# Patient Record
Sex: Male | Born: 1935 | Race: White | Hispanic: No | Marital: Married | Smoking: Former smoker
Health system: Southern US, Community
[De-identification: ages and names within clinical notes are randomized; demographics above are authoritative.]

## PROBLEM LIST (undated history)

## (undated) DIAGNOSIS — N401 Enlarged prostate with lower urinary tract symptoms: Secondary | ICD-10-CM

## (undated) DIAGNOSIS — J849 Interstitial pulmonary disease, unspecified: Secondary | ICD-10-CM

## (undated) DIAGNOSIS — E039 Hypothyroidism, unspecified: Secondary | ICD-10-CM

## (undated) DIAGNOSIS — M1990 Unspecified osteoarthritis, unspecified site: Secondary | ICD-10-CM

## (undated) DIAGNOSIS — N400 Enlarged prostate without lower urinary tract symptoms: Secondary | ICD-10-CM

## (undated) DIAGNOSIS — K219 Gastro-esophageal reflux disease without esophagitis: Secondary | ICD-10-CM

## (undated) DIAGNOSIS — I2510 Atherosclerotic heart disease of native coronary artery without angina pectoris: Secondary | ICD-10-CM

## (undated) DIAGNOSIS — J449 Chronic obstructive pulmonary disease, unspecified: Secondary | ICD-10-CM

## (undated) DIAGNOSIS — N138 Other obstructive and reflux uropathy: Secondary | ICD-10-CM

## (undated) DIAGNOSIS — H269 Unspecified cataract: Secondary | ICD-10-CM

## (undated) DIAGNOSIS — E785 Hyperlipidemia, unspecified: Secondary | ICD-10-CM

## (undated) DIAGNOSIS — S86019A Strain of unspecified Achilles tendon, initial encounter: Secondary | ICD-10-CM

## (undated) HISTORY — DX: Unspecified cataract: H26.9

## (undated) HISTORY — PX: APPENDECTOMY: SHX54

## (undated) HISTORY — PX: CHOLECYSTECTOMY: SHX55

## (undated) HISTORY — PX: COLONOSCOPY: SHX174

## (undated) HISTORY — PX: EYE SURGERY: SHX253

## (undated) HISTORY — DX: Atherosclerotic heart disease of native coronary artery without angina pectoris: I25.10

## (undated) HISTORY — DX: Benign prostatic hyperplasia without lower urinary tract symptoms: N40.0

## (undated) HISTORY — DX: Other obstructive and reflux uropathy: N40.1

## (undated) HISTORY — DX: Other obstructive and reflux uropathy: N13.8

## (undated) HISTORY — DX: Interstitial pulmonary disease, unspecified: J84.9

## (undated) HISTORY — DX: Hypothyroidism, unspecified: E03.9

## (undated) HISTORY — DX: Hyperlipidemia, unspecified: E78.5

## (undated) HISTORY — DX: Strain of unspecified achilles tendon, initial encounter: S86.019A

---

## 2000-09-27 ENCOUNTER — Encounter: Payer: Self-pay | Admitting: Neurology

## 2000-09-27 ENCOUNTER — Encounter: Admission: RE | Admit: 2000-09-27 | Discharge: 2000-09-27 | Payer: Self-pay | Admitting: Neurology

## 2005-08-10 ENCOUNTER — Ambulatory Visit (HOSPITAL_COMMUNITY): Admission: RE | Admit: 2005-08-10 | Discharge: 2005-08-10 | Payer: Self-pay | Admitting: Family Medicine

## 2007-03-29 ENCOUNTER — Ambulatory Visit (HOSPITAL_COMMUNITY): Admission: RE | Admit: 2007-03-29 | Discharge: 2007-03-29 | Payer: Self-pay | Admitting: Family Medicine

## 2007-08-07 ENCOUNTER — Inpatient Hospital Stay (HOSPITAL_COMMUNITY): Admission: EM | Admit: 2007-08-07 | Discharge: 2007-08-08 | Payer: Self-pay | Admitting: Emergency Medicine

## 2007-08-07 ENCOUNTER — Ambulatory Visit: Payer: Self-pay | Admitting: Cardiology

## 2007-08-08 ENCOUNTER — Ambulatory Visit: Payer: Self-pay

## 2007-08-16 ENCOUNTER — Ambulatory Visit: Payer: Self-pay | Admitting: Cardiology

## 2007-11-03 ENCOUNTER — Ambulatory Visit: Payer: Self-pay | Admitting: Gastroenterology

## 2007-11-10 ENCOUNTER — Ambulatory Visit: Payer: Self-pay | Admitting: Gastroenterology

## 2009-07-04 ENCOUNTER — Encounter: Payer: Self-pay | Admitting: Internal Medicine

## 2009-09-09 ENCOUNTER — Encounter: Payer: Self-pay | Admitting: Internal Medicine

## 2009-09-09 ENCOUNTER — Ambulatory Visit (HOSPITAL_COMMUNITY): Admission: RE | Admit: 2009-09-09 | Discharge: 2009-09-09 | Payer: Self-pay | Admitting: Family Medicine

## 2009-12-29 DIAGNOSIS — E039 Hypothyroidism, unspecified: Secondary | ICD-10-CM | POA: Insufficient documentation

## 2009-12-29 DIAGNOSIS — J45909 Unspecified asthma, uncomplicated: Secondary | ICD-10-CM | POA: Insufficient documentation

## 2009-12-29 DIAGNOSIS — E785 Hyperlipidemia, unspecified: Secondary | ICD-10-CM | POA: Insufficient documentation

## 2009-12-29 DIAGNOSIS — I2510 Atherosclerotic heart disease of native coronary artery without angina pectoris: Secondary | ICD-10-CM | POA: Insufficient documentation

## 2009-12-30 ENCOUNTER — Ambulatory Visit: Payer: Self-pay | Admitting: Internal Medicine

## 2009-12-30 DIAGNOSIS — J8410 Pulmonary fibrosis, unspecified: Secondary | ICD-10-CM | POA: Insufficient documentation

## 2009-12-30 DIAGNOSIS — J4489 Other specified chronic obstructive pulmonary disease: Secondary | ICD-10-CM | POA: Insufficient documentation

## 2009-12-30 DIAGNOSIS — J449 Chronic obstructive pulmonary disease, unspecified: Secondary | ICD-10-CM

## 2010-01-19 ENCOUNTER — Telehealth: Payer: Self-pay | Admitting: Internal Medicine

## 2010-02-10 ENCOUNTER — Ambulatory Visit: Payer: Self-pay | Admitting: Internal Medicine

## 2010-02-20 ENCOUNTER — Ambulatory Visit: Payer: Self-pay | Admitting: Internal Medicine

## 2010-02-24 LAB — CONVERTED CEMR LAB
Basophils Absolute: 0 10*3/uL (ref 0.0–0.1)
Eosinophils Relative: 1 % (ref 0.0–5.0)
MCV: 88.4 fL (ref 78.0–100.0)
Monocytes Absolute: 0.7 10*3/uL (ref 0.1–1.0)
Monocytes Relative: 7.4 % (ref 3.0–12.0)
Neutrophils Relative %: 73.1 % (ref 43.0–77.0)
Platelets: 280 10*3/uL (ref 150.0–400.0)
Pro B Natriuretic peptide (BNP): 122.4 pg/mL — ABNORMAL HIGH (ref 0.0–100.0)
RDW: 13.9 % (ref 11.5–14.6)
Sed Rate: 73 mm/hr — ABNORMAL HIGH (ref 0–22)
WBC: 10.1 10*3/uL (ref 4.5–10.5)

## 2010-03-05 ENCOUNTER — Ambulatory Visit: Payer: Self-pay | Admitting: Internal Medicine

## 2010-04-03 ENCOUNTER — Other Ambulatory Visit: Payer: Self-pay | Admitting: Internal Medicine

## 2010-04-03 ENCOUNTER — Encounter: Payer: Self-pay | Admitting: Internal Medicine

## 2010-04-03 ENCOUNTER — Ambulatory Visit
Admission: RE | Admit: 2010-04-03 | Discharge: 2010-04-03 | Payer: Self-pay | Source: Home / Self Care | Attending: Internal Medicine | Admitting: Internal Medicine

## 2010-04-03 LAB — SEDIMENTATION RATE: Sed Rate: 19 mm/hr (ref 0–22)

## 2010-04-03 LAB — BASIC METABOLIC PANEL
BUN: 29 mg/dL — ABNORMAL HIGH (ref 6–23)
CO2: 30 mEq/L (ref 19–32)
Calcium: 9.2 mg/dL (ref 8.4–10.5)
Chloride: 102 mEq/L (ref 96–112)
Creatinine, Ser: 1.1 mg/dL (ref 0.4–1.5)
GFR: 72.43 mL/min (ref >60.00)
Glucose, Bld: 114 mg/dL — ABNORMAL HIGH (ref 70–99)
Potassium: 3.6 mEq/L (ref 3.5–5.1)
Sodium: 140 mEq/L (ref 135–145)

## 2010-04-17 LAB — HEMOCCULT GUIAC POC 1CARD (OFFICE)

## 2010-04-28 NOTE — Assessment & Plan Note (Signed)
Summary: Pulmonary/ ext ov with hfa teaching @ 75% effective   Copy to:  [name] Primary Provider/Referring Provider:  [name]  CC:  Dyspnea- worse.  History of Present Illness: 81 yowm quit smoking in 1982 with chronic cough since then  referred by Dr Christell Constant for doe starting in the summer of 2011 and PF on cxr/ct.   December 30, 2009  1st pulmonary office eval  cc doe x rapid walk or uphills but does ok flat indolent onset but not progressive since around June 2011,  cough is present x years, worse with colds, no better with symbicort,  better breathing and coughing after symbicort and prednisone together.  hfa poor, rec work on technique and gerd diet/ ppi two times a day.  February 10, 2010 ov sob no better but has learned to pace himpself.  following diet and using ppi/ symbiocort but still struggling with hfa.  rec work on hfa, come to [facility] office for pft   February 20, 2010 ov cc worsening doe, still struggling with hfa. minimal cough. Pt denies any significant sore throat, dysphagia, itching, sneezing,  nasal congestion or excess secretions,  fever, chills, sweats, unintended wt loss, pleuritic or exertional cp, hempoptysis,   notes marked variability iin activity tolerance  orthopnea pnd or leg swelling. Pt also denies any obvious fluctuation in symptoms with weather or environmental change or other alleviating or aggravating factors.       Current Medications (verified): 1)  Levothroid 25 Mcg Tabs (Levothyroxine Sodium) .... One Tablet M, W, F 2)  Levothroid 125 Mcg Tabs (Levothyroxine Sodium) .... Take 1 Tablet By Mouth Once A Day Except On Sat and Sundy Take 1/2 Tablet 3)  Omeprazole 20 Mg Cpdr (Omeprazole) .... Take One 30-60 Min Before First and Last Meals of The Day 4)  Simvastatin 80 Mg Tabs (Simvastatin) .... Take 1 Tablet By Mouth Once A Day 5)  Terazosin Hcl 10 Mg Caps (Terazosin Hcl) .... Take 1 Capsule By Mouth Once A Day 6)  Ecotrin 325 Mg Tbec (Aspirin) .Marland Kitchen.. 1 Once  Daily 7)  Vitamin D 2000 Unit Caps (Cholecalciferol) .... Take 1 Tablet By Mouth Once A Day 8)  Symbicort 160-4.5 Mcg/act Aero (Budesonide-Formoterol Fumarate) .... 2 Puffs Twice Daily 9)  Mucinex Dm 30-600 Mg Xr12h-Tab (Dextromethorphan-Guaifenesin) .... As Needed  Allergies (verified): No Known Drug Allergies  Past History:  Past Medical History: Hypothyroidism Hyperlipidemia BPH Asthma    - PFT's wnl  07/2005    - HFA  50% p coaching December 30, 2009 > 50% February 10, 2010 > 75% February 20, 2010  Coronary Heart Disease Interstitial lung dz      - onset ? 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 Nl external ear canals without cough reflex NECK :  without JVD/Nodes/TM/ nl carotid upstrokes bilaterally LUNGS: no acc muscle use,  trace insp crackles bilaterally CV:  RRR  no s3 or murmur or increase in P2, no edema  ABD:  soft and nontender with nl excursion in the supine position. No bruits or organomegaly, bowel sounds nl MS:  warm without deformities, calf tenderness, cyanosis  ? very mild  clubbing    White Cell Count          10.1 K/uL                    4.5-10.5   Red Cell Count            4.56 Mil/uL                 4.22-5.81   Hemoglobin                13.7 g/dL                   14.7-82.9   Hematocrit                40.3 %                      39.0-52.0   MCV                       88.4 fl                     78.0-100.0   MCHC                      34.0 g/dL                   56.2-13.0   RDW                       13.9 %                      11.5-14.6   Platelet Count            280.0 K/uL                  150.0-400.0   Neutrophil %              73.1 %                      43.0-77.0   Lymphocyte %              18.2 %                      12.0-46.0   Monocyte %                7.4 %  3.0-12.0   Eosinophils%              1.0 %                       0.0-5.0   Basophils %               0.3 %                       0.0-3.0   Neutrophill Absolute      7.4 K/uL                    1.4-7.7   Lymphocyte Absolute       1.8 K/uL                    0.7-4.0   Monocyte Absolute         0.7 K/uL                    0.1-1.0  Eosinophils, Absolute                             0.1 K/uL                    0.0-0.7   Basophils Absolute        0.0 K/uL                    0.0-0.1  Tests: (2) B-Type Natiuretic Peptide (BNPR)  B-Type Natriuetic Peptide                        [H]  122.4 pg/mL                 0.0-100.0  Tests: (3) Sed Rate (ESR)   Sed Rate             [H]  73 mm/hr                    0-22  Impression & Recommendations:  Problem # 1:  PULMONARY FIBROSIS ILD POST INFLAMMATORY CHRONIC (ICD-515) DDx for pulmonary fibrosis with honeycombing includes idiopathic pulmonary fibrosis, pulmonary fibrosis associated with rheumatologic disease, adverse effect from  drugs such as chemotherapy or amiodarone exposure, nonspecific interstitial pneumonia which is typically steroid responsive, and chronic hypersensitivity pneumonitis/ pneumonconiosis.      Atypical pattern suggestive of asbestosis but no exp hx - desats walking so needs to  compltete the w/u with full pft's and f/u in [place] office  also needs GERD Rx based on assoc with PF though not necessarily cause and effect.   Elevated esr c/w collagen vasc dz though none obvious here,  rec short course of prednisone only.  Problem # 2:  ASTHMA (ICD-493.90) No evidence of  copd  and variability in ex tol  c/w asthma rathen  than PF so try max hfa effort then regroup in 2 weeks  See instructions for specific recommendations   I spent extra time with the patient today explaining optimal mdi  technique.  This improved from  50-75% p coaching  Medications Added to Medication List This Visit: 1)  Ecotrin 325 Mg Tbec (Aspirin) .Marland Kitchen.. 1 once daily 2)  Tramadol Hcl 50 Mg Tabs (Tramadol hcl) .... One to two by mouth every 4-6 hours 3)  Prednisone 10 Mg Tabs (Prednisone) .Marland KitchenMarland KitchenMarland Kitchen  4 each am x 2days, 2x2days, 1x2days and stop  Other Orders: T-Allergy Profile Region II-[place], [place], [place], [place], [place] (5484) TLB-CBC Platelet - w/Differential (85025-CBCD) TLB-BNP (B-Natriuretic Peptide) (83880-BNPR) TLB-Sedimentation Rate (ESR) (85652-ESR) Prescription Created Electronically [phone]) Est. Patient Level IV (62703)  Patient Instructions: 1)  Prednisone 10 mg 4 each am x 2days, 2x2days, 1x2days and stop  2)  Work on Cabin crew inhaler technique:  relax and blow all the way out then take a nice smooth deep breath back in, triggering the inhaler at same time you start breathing in  3)  Take delsym two tsp every 12 hours and add tramadol 50 mg up to every 4 hours to suppress the urge to cough. Swallowing water or using ice chips/non mint and menthol containing candies (such as lifesavers or sugarless jolly ranchers) are also effective.  4)  Please schedule a follow-up appointment in 2 weeks, sooner if needed with cxr on return 5)  ADD Needs cxr on return Prescriptions: TRAMADOL HCL 50 MG  TABS (TRAMADOL HCL) One to two by mouth every 4-6 hours  #40 x 0   Entered and Authorized by:   Nyoka Cowden MD    Signed by:   Nyoka Cowden MD on 02/20/2010   Method used:   Electronically to        [facility]  [address] (retail)       [address]       [place], [place]  [address]       Ph: [phone]       Fax: [phone]   RxID:   [id number] PREDNISONE 10 MG  TABS (PREDNISONE) 4 each am x 2days, 2x2days, 1x2days and stop  #14 x 0   Entered and Authorized by:   Nyoka Cowden MD   Signed by:   Nyoka Cowden MD on 02/20/2010   Method used:   Electronically to        [facility] [phone]* (retail)       [address] [place], [place]  [address]       Ph: [phone] or [phone]       Fax: [phone]   RxID:   [id number]   Appended Document: Pulmonary/ ext ov with hfa teaching @ 75% effective see addendum to instructions

## 2010-04-28 NOTE — Assessment & Plan Note (Signed)
Summary: Pulmonary/ ext ov  sats =88 p 2 laps>  start prednisone esr 73   Copy to:  [name] Primary Provider/Referring Provider:  [name]  CC:  Dyspnea- no change.  History of Present Illness: 80 yowm quit smoking in 1982 with improvement but never complete resolution in cough since then  referred by Dr Christell Constant for doe starting in the summer of 2011 and PF on cxr/ct.   December 30, 2009  1st pulmonary office eval  cc doe x rapid walk or uphills but does ok flat indolent onset but not progressive since around June 2011,  cough is present x years, worse with colds, no better with symbicort,  better breathing and coughing after symbicort and prednisone together.  hfa poor, rec work on technique and gerd diet/ ppi two times a day.  February 10, 2010 ov sob no better but has learned to pace himself.  following diet and using ppi/ symbiocort but still struggling with hfa.  rec work on hfa, come to [facility] office for pft   February 20, 2010 ov cc worsening doe, still struggling with hfa. minimal cough.  ESR 73 rec short course of prednisone helped, never took tramadol   March 05, 2010 ov some better esp cough with delsym and prednisone, did not take tramadol as rec but didn't really need it.  Pt denies any significant sore throat, dysphagia, itching, sneezing,  nasal congestion or excess secretions,  fever, chills, sweats, unintended wt loss, pleuritic or exertional cp, hempoptysis,  variability  in activity tolerance  orthopnea pnd or leg swelling. Pt also denies any obvious fluctuation in symptoms with weather or environmental change or other alleviating or aggravating factors.       Current Medications (verified): 1)  Levothroid 25 Mcg Tabs (Levothyroxine Sodium) .... One Tablet M, W, F 2)  Levothroid 125 Mcg Tabs (Levothyroxine Sodium) .... Take 1 Tablet By Mouth Once A Day Except On Sat and Sundy Take 1/2 Tablet 3)  Omeprazole 20 Mg Cpdr (Omeprazole) .... Take One 30-60 Min Before First and  Last Meals of The Day 4)  Simvastatin 80 Mg Tabs (Simvastatin) .... Take 1 Tablet By Mouth Once A Day 5)  Terazosin Hcl 10 Mg Caps (Terazosin Hcl) .... Take 1 Capsule By Mouth Once A Day 6)  Ecotrin 325 Mg Tbec (Aspirin) .Marland Kitchen.. 1 Once Daily 7)  Vitamin D 2000 Unit Caps (Cholecalciferol) .... Take 1 Tablet By Mouth Once A Day 8)  Symbicort 160-4.5 Mcg/act Aero (Budesonide-Formoterol Fumarate) .... 2 Puffs Twice Daily  Allergies (verified): No Known Drug Allergies  Past History:  Past Medical History: Hypothyroidism Hyperlipidemia BPH ?Asthma    - PFT's wnl  07/2005 > no better on symbicort so d/c March 06, 2010     - HFA  50% p coaching December 30, 2009 > 50% February 10, 2010 > 75% February 20, 2010     - Allergy profile neg 02/20/10  Coronary Heart Disease Interstitial lung dz      - onset ? 2007 with CT Chest 08/10/05 c/w PF esp RUL  > LUL      - CT Chest suggestive of asbestosis 09/09/09 non contrasted      - Desat p 3 laps at [facility] office December 30, 2009       - PFTs February 20, 2010 FEV1  1.66 (67%) ratio 80, no better p B2,  DLC0 55 > corrects to 123      - ESR 73 February 20, 2010 > Pred x 6 days only> better so start daily prednisone March 06, 2010   Social History: Reviewed history from 02/20/2010 and no changes required. 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  Vital Signs:  Patient profile:   77 year old male Weight:      179 pounds O2 Sat:      95 % on Room air Temp:     97.5 degrees F oral Pulse rate:   77 / minute BP sitting:   112 / 64  (left arm)  Vitals Entered By: Vernie Murders (March 05, 2010 2:08 PM)  O2 Flow:  Room air  Serial Vital Signs/Assessments:  Comments: 3:18 PM Ambulatory Pulse Oximetry  Resting; HR__81___    02 Sat__94%ra___  Lap1 (185 feet)   HR__86___   02 Sat__91%ra___ Lap2 (185 feet)   HR__90___   02 Sat__88%ra___    Lap3 (185 feet)   HR_____   02 Sat_____  ___Test Completed  without Difficulty _x__Test Stopped due ZO:[id number] o2 sats and pt started to c/p feeling SOB   By: Vernie Murders    Physical Exam  Additional Exam:  wt 177 February 10, 2010  > 179 February 20, 2010 . 179 March 05, 2010  pleasant amb wm nad, mild hoarseness HEENT: nl dentition, turbinates, and orophanx. Nl external ear canals without cough reflex NECK :  without JVD/Nodes/TM/ nl carotid upstrokes bilaterally LUNGS: no acc muscle use,  trace insp crackles bilaterally CV:  RRR  no s3 or murmur or increase in P2, no edema  ABD:  soft and nontender with nl excursion in the supine position. No bruits or organomegaly, bowel sounds nl MS:  warm without deformities, calf tenderness, cyanosis  ? very mild  clubbing    Impression & Recommendations:  Problem # 1:  PULMONARY FIBROSIS ILD POST INFLAMMATORY CHRONIC (ICD-515)  DDx for pulmonary fibrosis with honeycombing includes idiopathic pulmonary fibrosis, pulmonary fibrosis associated with rheumatologic disease, adverse effect from  drugs such as chemotherapy or amiodarone exposure, nonspecific interstitial pneumonia which is typically steroid responsive, and chronic hypersensitivity pneumonitis/ pneumonconiosis.    Atypical pattern suggestive of asbestosis but no exp hx - desats walking so needs to compltete the w/u with full pft's and f/u in [place] office  also needs GERD Rx based on assoc with PF though not necessarily cause and effect.   Elevated esr c/w collagen vasc dz though none obvious here,  he's convinced he's better on prednisone in terms of sob and arthritis.  The goal with a chronic steroid dependent illness is always arriving at the lowest effective dose that controls the disease/symptoms and not accepting a set "formula" which is based on statistics that don't take into accound individual variability or the natural hx of the dz in every individual patient, which may well vary over time.   For now try 20 mg per day ceiling and  10 mg per day floor.  Discussed in detail all the  indications, usual  risks and alternatives  relative to the benefits with patient who agrees to proceed with maint prednisone for now.  Medications Added to Medication List This Visit: 1)  Prednisone 10 Mg Tabs (Prednisone) .... Take 2 daily with breakfast until you improve then one daily  Other Orders: T-2 View CXR (71020TC) Est. Patient Level IV (99214) Pulse Oximetry, Ambulatory (98119)  Patient Instructions: 1)  Delsym if needed for cough 2)  start prednisone 10 mg  2 daily with bfast unitl you improve then 1 daily 3)  Please schedule a follow-up appointment in 4 weeks, sooner if needed  Prescriptions: PREDNISONE 10 MG  TABS (PREDNISONE) take 2 daily with breakfast until you improve then one daily  #100 x 0   Entered and Authorized by:   Nyoka Cowden MD   Signed by:   Nyoka Cowden MD on 03/05/2010   Method used:   Electronically to        [facility] [address] [facility] (retail)       [address]       [place], [place]  [address]       Ph: [phone]       Fax: [phone]   RxID:   [id number]

## 2010-04-28 NOTE — Assessment & Plan Note (Signed)
Summary: Pulmonary new pt eval for ?COPD/pf   Copy to:  [name] Primary Provider/Referring Provider:  [name]  CC:  Pulmonary Consult for SOb on exertion. Marland Kitchen  History of Present Illness: 46 yowm quit smoking in 1982 with chronic cough since then but no sob  referred by Dr Christell Constant for doe starting in the summer of 2011   December 30, 2009  1st pulmonary office eval  cc doe x rapid walk or uphills but does ok flat indolent onset but not progressive since around June 2011,  cough is present x years, worse with colds, no better with symbicort,  better breathing and coughing after symbicort and prednisone.  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  Pt denies any significant sore throat, dysphagia, itching, sneezing,  nasal congestion or excess secretions,  fever, chills, sweats, unintended wt loss, pleuritic or exertional cp, hempoptysis, change in activity tolerance  orthopnea pnd or leg swelling Pt also denies any obvious fluctuation in symptoms with weather or environmental change or other alleviating or aggravating factors.       Current Medications (verified): 1)  Levothroid 25 Mcg Tabs (Levothyroxine Sodium) .... One Tablet M, W, F 2)  Levothroid 125 Mcg Tabs (Levothyroxine Sodium) .... Take 1 Tablet By Mouth Once A Day Except On Sat and Sundy Take 1/2 Tablet 3)  Omeprazole 20 Mg Cpdr (Omeprazole) .... Take 1 Tablet By Mouth Once A Day 4)  Simvastatin 80 Mg Tabs (Simvastatin) .... Take 1 Tablet By Mouth Once A Day 5)  Terazosin Hcl 10 Mg Caps (Terazosin Hcl) .... Take 1 Capsule By Mouth Once A Day 6)  Bufferin 325 Mg Tabs (Aspirin Buf(Cacarb-Mgcarb-Mgo)) .... Take 1 Tablet By Mouth Once A Day 7)  Vitamin D 2000 Unit Caps (Cholecalciferol) .... Take 1 Tablet By Mouth Once A Day 8)  Symbicort 160-4.5 Mcg/act Aero (Budesonide-Formoterol Fumarate) .... 2 Puffs Twice Daily 9)  Mucinex Dm 30-600 Mg Xr12h-Tab (Dextromethorphan-Guaifenesin) .... As  Needed  Allergies (verified): No Known Drug Allergies  Past History:  Past Medical History: Hypothyroidism Hyperlipidemia BPH Asthma    - PFT's wnl  07/2005    - HFA  50% p coaching December 30, 2009 Coronary Heart Disease Interstitial lung dz      - onset ? 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  The patient denies shortness of breath at rest, non-productive cough, coughing up blood, chest pain, irregular heartbeats, acid heartburn, indigestion, loss of appetite, weight change, abdominal pain, difficulty swallowing, sore throat, tooth/dental problems, headaches, nasal congestion/difficulty breathing through nose, sneezing, itching, ear ache, anxiety, depression, hand/feet swelling, joint stiffness or pain, rash, change in color of mucus, and fever.    Vital Signs:  Patient profile:   77 year old male Height:      68 inches Weight:      180 pounds BMI:     27.47 O2 Sat:      96 % on Room air Temp:     97.0 degrees F oral Pulse rate:  63 / minute BP sitting:   120 / 70  (right arm) Cuff size:   regular  Vitals Entered By: Carron Curie CMA (December 30, 2009 9:06 AM)  O2 Flow:  Room air  Serial Vital Signs/Assessments:  Comments: Ambulatory Pulse Oximetry  Resting; HR__78___    02 Sat___94__  Lap1 (185 feet)   HR_84____   02 Sat__94___ Lap2 (185 feet)   HR___92__   02 Sat__93___    Lap3 (185 feet)   HR__92___   02 Sat__88__ 3 laps in [place] on room air  ___Test Completed without Difficulty __x_Test Stopped due to: pt desaturated    By: Carron Curie CMA    Physical Exam  Additional Exam:  wt 180 pleasant  amb wm nad, mild hoarseness HEENT: nl dentition, turbinates, and orophanx. Nl external ear canals without cough reflex NECK :  without JVD/Nodes/TM/ nl carotid upstrokes bilaterally LUNGS: no acc muscle use,  trace insp crackles bilaterally CV:  RRR  no s3 or murmur or increase in P2, no edema  ABD:  soft and nontender with nl excursion in the supine position. No bruits or organomegaly, bowel sounds nl MS:  warm without deformities, calf tenderness, cyanosis or clubbing SKIN: warm and dry without lesions   NEURO:  alert, approp, no deficits     Impression & Recommendations:  Problem # 1:  COPD UNSPECIFIED (ICD-496) Suggested by improvement on symbicort    DDX of  difficult airways managment all start with A and  include Adherence, Ace Inhibitors, Acid Reflux, Active Sinus Disease, Alpha 1 Antitripsin deficiency, Anxiety masquerading as Airways dz,  ABPA,  allergy(esp in young), Aspiration (esp in elderly), Adverse effects of DPI,  Active smokers, plus one B  = Beta blocker use..   Adherence:  I spent extra time with the patient today explaining optimal mdi  technique.  This improved from  25-50%  ?Acid reflux: See instructions for specific recommendations    .  Problem # 2:  PULMONARY FIBROSIS ILD POST INFLAMMATORY CHRONIC (ICD-515) Atypical pattern suggestive of asbestosis but no exp hx - desats walking so needs to compltete the w/u with full pft's and f/u in [place] office  also needs GERD Rx based on assoc with PF though not necessarily cause and effect  Medications Added to Medication List This Visit: 1)  Omeprazole 20 Mg Cpdr (Omeprazole) .... Take one 30-60 min before first and last meals of the day 2)  Bufferin 325 Mg Tabs (Aspirin buf(cacarb-mgcarb-mgo)) .... Take 1 tablet by mouth once a day 3)  Symbicort 160-4.5 Mcg/act Aero (Budesonide-formoterol fumarate) .... 2 puffs twice daily 4)  Mucinex Dm 30-600 Mg Xr12h-tab (Dextromethorphan-guaifenesin) .... As needed  Complete  Medication List: 1)  Levothroid 25 Mcg Tabs (Levothyroxine sodium) .... One tablet m, w, f 2)  Levothroid 125 Mcg Tabs (Levothyroxine sodium) .... Take 1 tablet by mouth once a day except on sat and sundy take 1/2 tablet 3)  Omeprazole 20 Mg Cpdr (Omeprazole) .... Take one 30-60 min before first and last meals of the day 4)  Simvastatin 80 Mg Tabs (Simvastatin) .... Take 1 tablet by mouth once a day 5)  Terazosin Hcl 10 Mg Caps (Terazosin hcl) .... Take 1 capsule by mouth once a day 6)  Bufferin 325 Mg Tabs (Aspirin buf(cacarb-mgcarb-mgo)) .... Take 1 tablet by mouth once a day 7)  Vitamin D 2000 Unit Caps (Cholecalciferol) .... Take 1 tablet by mouth once a day 8)  Symbicort 160-4.5 Mcg/act Aero (Budesonide-formoterol fumarate) .... 2 puffs  twice daily 9)  Mucinex Dm 30-600 Mg Xr12h-tab (Dextromethorphan-guaifenesin) .... As needed  Other Orders: New Patient Level V (96045) Pulse Oximetry, Ambulatory (40981) Prescription Created Electronically [id number])  Patient Instructions: 1)  GERD (REFLUX)  is a common cause of respiratory symptoms. It commonly presents without heartburn and can be treated with medication, but also with lifestyle changes including avoidance of late meals, excessive alcohol, smoking cessation, and avoid fatty foods, chocolate, peppermint, colas, red wine, and acidic juices such as orange juice. NO MINT OR MENTHOL PRODUCTS SO NO COUGH DROPS  2)  USE SUGARLESS CANDY INSTEAD (jolley ranchers)  3)  NO OIL BASED VITAMINS  4)  Increase omeprazole to 20mg  Take one 30-60 min before first and last meals of the day  5)  Work on inhaler technique:  relax and blow all the way out then take a nice smooth deep breath back in, triggering the inhaler at same time you start breathing in and hold for a few seconds then back out thru the nose 6)  Please schedule a follow-up appointment in 6 weeks, sooner if needed  Prescriptions: OMEPRAZOLE 20 MG CPDR (OMEPRAZOLE) Take one 30-60 min before  first and last meals of the day  #60 x 11   Entered and Authorized by:   Nyoka Cowden MD   Signed by:   Nyoka Cowden MD on 12/30/2009   Method used:   Electronically to        [facility] [phone]* (retail)       [address] [place], [place]  [address]       Ph: [phone] or [phone]       Fax: [phone]   RxID:   [id number]    Immunization History:  Influenza Immunization History:    Influenza:  fluvax 3+ (12/09/2009)  Pneumovax Immunization History:    Pneumovax:  pneumovax (12/29/1998)

## 2010-04-28 NOTE — Assessment & Plan Note (Signed)
Summary: Pulmonary/ hfa 50%/ f/u copd/ pf > pft's next    Copy to:  [name] Primary Provider/Referring Provider:  [name]  CC:  6 week follow-up.Marland Kitchen  History of Present Illness: 72 yowm quit smoking in 1982 with chronic cough since then  referred by Dr Christell Constant for doe starting in the summer of 2011 and PF on cxr/ct.   December 30, 2009  1st pulmonary office eval  cc doe x rapid walk or uphills but does ok flat indolent onset but not progressive since around June 2011,  cough is present x years, worse with colds, no better with symbicort,  better breathing and coughing after symbicort and prednisone together.  hfa poor, rec work on technique and gerd diet/ ppi two times a day.  February 10, 2010 ov sob no better but has learned to pace himpself.  following diet and using ppi/ symbiocort but still struggling with hfa.  Pt denies any significant sore throat, dysphagia, itching, sneezing,  nasal congestion or excess secretions,  fever, chills, sweats, unintended wt loss, pleuritic or exertional cp, hempoptysis, variability  in activity tolerance  orthopnea pnd or leg swelling. Pt also denies any obvious fluctuation in symptoms with weather or environmental change or other alleviating or aggravating factors.       Current Medications (verified): 1)  Levothroid 25 Mcg Tabs (Levothyroxine Sodium) .... One Tablet M, W, F 2)  Levothroid 125 Mcg Tabs (Levothyroxine Sodium) .... Take 1 Tablet By Mouth Once A Day Except On Sat and Sundy Take 1/2 Tablet 3)  Omeprazole 20 Mg Cpdr (Omeprazole) .... Take One 30-60 Min Before First and Last Meals of The Day 4)  Simvastatin 80 Mg Tabs (Simvastatin) .... Take 1 Tablet By Mouth Once A Day 5)  Terazosin Hcl 10 Mg Caps (Terazosin Hcl) .... Take 1 Capsule By Mouth Once A Day 6)  Bufferin 325 Mg Tabs (Aspirin Buf(Cacarb-Mgcarb-Mgo)) .... Take 1 Tablet By Mouth Once A Day 7)  Vitamin D 2000 Unit Caps (Cholecalciferol) .... Take 1 Tablet By Mouth Once A Day 8)   Symbicort 160-4.5 Mcg/act Aero (Budesonide-Formoterol Fumarate) .... 2 Puffs Twice Daily 9)  Mucinex Dm 30-600 Mg Xr12h-Tab (Dextromethorphan-Guaifenesin) .... As Needed  Allergies (verified): No Known Drug Allergies  Past History:  Past Medical History: Hypothyroidism Hyperlipidemia BPH Asthma    - PFT's wnl  07/2005    - HFA  50% p coaching December 30, 2009 > 50% February 10, 2010  Coronary Heart Disease Interstitial lung dz      - onset ? 2007 with CT Chest 08/10/05 c/w PF esp RUL  > LUL      - CT Chest suggestive of asbestosis 09/09/09      - Desat p 3 laps at [facility] office December 30, 2009   Vital Signs:  Patient profile:   77 year old male Height:      68 inches Weight:      177 pounds O2 Sat:      95 % on Room air Temp:     97.0 degrees F oral Pulse rate:   64 / minute BP sitting:   118 / 70  (right arm)  Vitals Entered By: Carron Curie CMA (February 10, 2010 9:14 AM)  O2 Flow:  Room air  Physical Exam  Additional Exam:  wt 180 > 177 February 10, 2010  pleasant amb wm nad, mild hoarseness HEENT: nl dentition, turbinates, and orophanx. Nl external ear canals without cough reflex NECK :  without JVD/Nodes/TM/ nl carotid upstrokes bilaterally LUNGS: no acc muscle use,  trace insp crackles bilaterally CV:  RRR  no s3 or murmur or increase in P2, no edema  ABD:  soft and nontender with nl excursion in the supine position. No bruits or organomegaly, bowel sounds nl MS:  warm without deformities, calf tenderness, cyanosis  ? very mild  clubbing    Impression & Recommendations:  Problem # 1:  COPD UNSPECIFIED (ICD-496) Suggested by improvement on symbicort and prednisone may have AB component.    DDX of  difficult airways managment all start with A and  include Adherence, Ace Inhibitors, Acid Reflux, Active Sinus Disease, Alpha 1 Antitripsin deficiency, Anxiety masquerading as Airways dz,  ABPA,  allergy(esp in young), Aspiration (esp in elderly), Adverse  effects of DPI,  Active smokers, plus one B  = Beta blocker use..   Adherence:  I spent extra time with the patient today explaining optimal mdi  technique.  This improved from  25-50%  ?Acid reflux: See instructions for specific recommendations    .  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  Other Orders: Est. Patient Level III (16109) HFA Instruction [id number])  Patient Instructions: 1)  PFT's and office visit same day to see me in [place] 2)  Work on inhaler technique:  relax and blow all the way out then take a nice smooth deep breath back in, triggering the inhaler at same time you start breathing in

## 2010-04-28 NOTE — Progress Notes (Signed)
Summary: [facility] appt  Phone Note Outgoing Call   Call placed by: Carron Curie CMA,  January 19, 2010 5:31 PM Call placed to: Patient Summary of Call: [facility] patient. Pt needs appt for 6 week follow-up. I have LMTCBx1 to schedule pt. November date is 02-10-10. Initial call taken by: Carron Curie CMA,  January 19, 2010 5:32 PM  Follow-up for Phone Call        LMTCBx2 to set appt for [facility] on 02-10-10. Carron Curie CMA  January 22, 2010 9:37 AM  pt set to see MW on 02-10-10 at 9:15.Carron Curie CMA  February 04, 2010 12:02 PM

## 2010-04-28 NOTE — Miscellaneous (Signed)
Summary: Orders Update-pft charges//jwr  Clinical Lists Changes  Orders: Added new Service order of Carbon Monoxide diffusing w/capacity (94720) - Signed Added new Service order of Lung Volumes (94240) - Signed Added new Service order of Spirometry (Pre & Post) (94060) - Signed 

## 2010-04-30 NOTE — Assessment & Plan Note (Signed)
Summary: Pulmonary/ext f/u ov better on prednisone    Copy to:  [name] Primary Provider/Referring Provider:  [name]  CC:  Dyspnea- improved.  History of Present Illness: 82 yowm quit smoking in 1982 with improvement but never complete resolution in cough since then  referred by Dr Christell Constant for doe starting in the summer of 2011 and PF on cxr/ct.   December 30, 2009  1st pulmonary office eval  cc doe x rapid walk or uphills but does ok flat indolent onset but not progressive since around June 2011,  cough is present x years, worse with colds, no better with symbicort,  better breathing and coughing after symbicort and prednisone together.  hfa poor, rec work on technique and gerd diet/ ppi two times a day.  February 10, 2010 ov sob no better but has learned to pace himself.  following diet and using ppi/ symbiocort but still struggling with hfa.  rec work on hfa, come to [employer] office for pft   February 20, 2010 ov cc worsening doe, still struggling with hfa. minimal cough.  ESR 73 rec short course of prednisone helped, never took tramadol   March 05, 2010 ov some better esp cough with delsym and prednisone, did not take tramadol as rec but didn't really need it.  Delsym if needed for cough start prednisone 10 mg 2 daily with bfast unitl you improve then 1 daily Symbicort 160 stop  April 03, 2010  ov cough and breathing both better to his satisfaction on the 20 p reduced it to 10 mg per day and no change no following diet, chewing mint gum and using oil based vit d still.  Pt denies any significant sore throat, dysphagia, itching, sneezing,  nasal congestion or excess secretions,  fever, chills, sweats, unintended wt loss, pleuritic or exertional cp, hempoptysis, variability in activity tolerance which is now improved vs baseline, no orthopnea pnd or leg swelling       Current Medications (verified): 1)  Levothroid 25 Mcg Tabs (Levothyroxine Sodium) .... One Tablet M, W, F 2)   Levothroid 125 Mcg Tabs (Levothyroxine Sodium) .... Take 1 Tablet By Mouth Once A Day Except On Sat and Sundy Take 1/2 Tablet 3)  Omeprazole 20 Mg Cpdr (Omeprazole) .... Take One 30-60 Min Before First and Last Meals of The Day 4)  Simvastatin 80 Mg Tabs (Simvastatin) .... Take 1 Tablet By Mouth Once A Day 5)  Terazosin Hcl 10 Mg Caps (Terazosin Hcl) .... Take 1 Capsule By Mouth Once A Day 6)  Ecotrin 325 Mg Tbec (Aspirin) .Marland Kitchen.. 1 Once Daily 7)  Vitamin D 2000 Unit Caps (Cholecalciferol) .... Take 1 Tablet By Mouth Once A Day 8)  Prednisone 10 Mg  Tabs (Prednisone) .Marland Kitchen.. 1 Once Daily  Allergies (verified): No Known Drug Allergies  Past History:  Past Medical History: Hypothyroidism Hyperlipidemia BPH ?Asthma    - PFT's wnl  07/2005 > no better on symbicort so d/c March 06, 2010     - HFA  50% p coaching December 30, 2009 > 50% February 10, 2010 > 75% February 20, 2010     - Allergy profile neg 02/20/10  Coronary Heart Disease Interstitial lung dz      - onset ? 2007 with CT Chest 08/10/05 c/w PF esp RUL  > LUL      - CT Chest suggestive of asbestosis 09/09/09 non contrasted      - Desat p 3 laps at [facility] office  December 30, 2009       - PFTs February 20, 2010 FEV1  1.66 (67%) ratio 80, no better p B2,  DLC0 55 > corrects to 123      - ESR 73 February 20, 2010 > Pred x 6 days only> better so start daily prednisone March 06, 2010       - ESR 14 on 10 mg per day April 03, 2010 but still desats walking  Vital Signs:  Patient profile:   77 year old male Weight:      177 pounds O2 Sat:      94 % on Room air Temp:     97.5 degrees F oral Pulse rate:   89 / minute BP sitting:   112 / 80  (left arm)  Vitals Entered By: Vernie Murders (April 03, 2010 9:53 AM)  O2 Flow:  Room air  Serial Vital Signs/Assessments:  Comments: 10:25 AM Ambulatory Pulse Oximetry  Resting; HR__83___    02 Sat__95%ra___  Lap1 (185 feet)   HR__93___   02 Sat__94%ra___ Lap2 (185 feet)    HR__101___   02 Sat__86%ra___    Lap3 (185 feet)   HR_____   02 Sat_____  ___Test Completed without Difficulty _x__Test Stopped due to: o2 sats decreased to 86%ra   By: Vernie Murders    Physical Exam  Additional Exam:  wt 177 February 10, 2010  > 179 February 20, 2010 . 179 March 05, 2010 > 177 April 03, 2010  pleasant amb wm nad, mild hoarseness HEENT: nl dentition, turbinates, and orophanx. Nl external ear canals without cough reflex NECK :  without JVD/Nodes/TM/ nl carotid upstrokes bilaterally LUNGS: no acc muscle use,  trace insp crackles bilaterally CV:  RRR  no s3 or murmur or increase in P2, no edema  ABD:  soft and nontender with nl excursion in the supine position. No bruits or organomegaly, bowel sounds nl MS:  warm without deformities, calf tenderness, cyanosis  ? 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    Atypical pattern suggestive of asbestosis but no exp hx - desats walking still but not observing diet max and   needs GERD Rx based on assoc with PF though not necessarily cause and effect as per most recent published studies in PF   Elevated esr c/w collagen vasc dz though none obvious here,  he's convinced he's better on prednisone in terms of sob and arthritis.  The goal with a chronic steroid dependent illness is always arriving at the lowest effective dose that controls the disease/symptoms and not accepting a set "formula" which is based on statistics that don't take into accound individual variability or the natural hx of the dz  in every individual patient, which may well vary over time.   For now try 20 mg per day ceiling and 5  mg per day floor.  Medications Added to Medication List This Visit: 1)  Prednisone 10 Mg Tabs (Prednisone) .Marland Kitchen.. 1 once daily  Other Orders: T-Vitamin D (25-Hydroxy) (16109-60454) TLB-Sedimentation Rate (ESR) (85652-ESR) TLB-BMP (Basic Metabolic Panel-BMET) (80048-METABOL) Est. Patient Level IV (09811) Prescription Created Electronically [phone]) Pulse Oximetry, Ambulatory (29562)  Patient Instructions: 1)  GERD (REFLUX)  is a common cause of respiratory symptoms. It commonly presents without heartburn and can be treated with medication, but also with lifestyle changes including avoidance of late meals, excessive alcohol, smoking cessation, and avoid fatty foods, chocolate, peppermint, colas, red wine, and acidic juices such as orange juice. NO MINT OR MENTHOL PRODUCTS SO NO COUGH DROPS  2)  USE SUGARLESS CANDY INSTEAD (jolley ranchers)  3)  NO OIL BASED VITAMINS (Vit D needs to be powdered base) 4)  Try a ceiling dose of Prednisone is 10 mg 2 daily and a floor of 5 mg  one half pill daily taken at breakfast 5)  Return to office in 3 months, sooner if needed  Prescriptions: PREDNISONE 10 MG  TABS (PREDNISONE) 1 once daily  #100 x 0   Entered and Authorized by:   Nyoka Cowden MD   Signed by:   Nyoka Cowden MD on 04/03/2010   Method used:   Electronically to        [facility] [phone]* (retail)       [address] [place], [place]  [address]       Ph: [phone] or [phone]       Fax: [phone]   RxID:   [id number]

## 2010-06-22 ENCOUNTER — Encounter: Payer: Self-pay | Admitting: Internal Medicine

## 2010-06-26 ENCOUNTER — Ambulatory Visit (INDEPENDENT_AMBULATORY_CARE_PROVIDER_SITE_OTHER): Payer: Medicare Other | Admitting: Internal Medicine

## 2010-06-26 ENCOUNTER — Other Ambulatory Visit (INDEPENDENT_AMBULATORY_CARE_PROVIDER_SITE_OTHER): Payer: Medicare Other

## 2010-06-26 ENCOUNTER — Encounter: Payer: Self-pay | Admitting: Internal Medicine

## 2010-06-26 VITALS — BP 118/74 | HR 67 | Temp 98.0°F | Ht 66.0 in | Wt 184.6 lb

## 2010-06-26 DIAGNOSIS — J8410 Pulmonary fibrosis, unspecified: Secondary | ICD-10-CM

## 2010-06-26 DIAGNOSIS — R059 Cough, unspecified: Secondary | ICD-10-CM | POA: Insufficient documentation

## 2010-06-26 DIAGNOSIS — R05 Cough: Secondary | ICD-10-CM

## 2010-06-26 LAB — SEDIMENTATION RATE: Sed Rate: 41 mm/hr — ABNORMAL HIGH (ref 0–22)

## 2010-06-26 NOTE — Assessment & Plan Note (Signed)
The most common causes of chronic cough in immunocompetent adults include the following: upper airway cough syndrome (UACS), previously referred to as postnasal drip syndrome (PNDS), which is caused by variety of rhinosinus conditions; (2) asthma; (3) GERD; (4) chronic bronchitis from cigarette smoking or other inhaled environmental irritants; (5) nonasthmatic eosinophilic bronchitis; and (6) bronchiectasis.   These conditions, singly or in combination, have accounted for up to 94% of the causes of chronic cough in prospective studies.   Other conditions have constituted no >6% of the causes in prospective studies These have included bronchogenic carcinoma, chronic interstitial pneumonia, sarcoidosis, left ventricular failure, ACEI-induced cough, and aspiration from a condition associated with pharyngeal dysfunction.   The throat clearing is typical  Classic Upper airway cough syndrome, so named because it's frequently impossible to sort out how much is  CR/sinusitis with freq throat clearing (which can be related to primary GERD)   vs  causing  secondary (" extra esophageal")  GERD from wide swings in gastric pressure that occur with throat clearing, often  promoting self use of mint and menthol lozenges that reduce the lower esophageal sphincter tone and exacerbate the problem further in a cyclical fashion.   These are the same pts who not infrequently have failed to tolerate ace inhibitors,  dry powder inhalers or biphosphonates or report having reflux symptoms that don't respond to standard doses of PPI , and are easily confused as having aecopd or asthma flares,   For now try zyrtec as needed and emphasized adherence with ppi and diet (still using oil based vitamins)

## 2010-06-26 NOTE — Assessment & Plan Note (Signed)
DDx for pulmonary fibrosis  includes idiopathic pulmonary fibrosis, pulmonary fibrosis associated with rheumatologic diseases (which have a relatively benign course in most cases) , adverse effect from  drugs such as chemotherapy or amiodarone exposure, nonspecific interstitial pneumonia which is typically steroid responsive, and chronic hypersensitivity pneumonitis.   In active  smokers Langerhan's Cell  Histiocyctosis (eosinophilic granuomatosis),  DIP,  and Respiratory Bronchiolitis ILD also need to be considered,    Regardless of specific dx he appears partially steroid responsive.  The goal with a chronic steroid dependent illness is always arriving at the lowest effective dose that controls the disease/symptoms and not accepting a set "formula" which is based on statistics or guidelines that don't always take into account patient  variability or the natural hx of the dz in every individual patient, which may well vary over time.  For now therefore I recommend the patient maintain .  For now try 10 mg per day

## 2010-06-26 NOTE — Patient Instructions (Signed)
Increase Prednisone back to 10 mg daily - this may improve your breathing and eliminate your drainage  If nasal drainage continues ok to use zyrtec 10 mg at bedtime as needed (this is a 24 hour pill that is available over the counter) - t  Please schedule a follow up visit in 3 months but call sooner if needed with cxr on return

## 2010-06-26 NOTE — Progress Notes (Signed)
Subjective:    Patient ID: Raymond [name], male    DOB: [birth year], 77 y.o.   MRN: [id number]  HPI   Primary Provider/Referring Provider: [name]     History of Present Illness:  70 yowm quit smoking in 1982 with improvement but never complete resolution in cough since then referred by Dr Christell Constant for doe starting in the summer of 2011 and PF on cxr/ct.   December 30, 2009 1st pulmonary office eval cc doe x rapid walk or uphills but does ok flat indolent onset but not progressive since around June 2011, cough is present x years, worse with colds, no better with symbicort, better breathing and coughing after symbicort and prednisone together. hfa poor, rec work on technique and gerd diet/ ppi two times a day.   February 10, 2010 ov sob no better but has learned to pace himself. following diet and using ppi/ symbiocort but still struggling with hfa. rec work on hfa, come to [employer] office for pft   February 20, 2010 ov cc worsening doe, still struggling with hfa. minimal cough. ESR 73 rec short course of prednisone helped, never took tramadol   March 05, 2010 ov some better esp cough with delsym and prednisone, did not take tramadol as rec but didn't really need it. Delsym if needed for cough  start prednisone 10 mg 2 daily with bfast until improve then 1 daily  Symbicort  stop   April 03, 2010 ov cough and breathing both better to his satisfaction on the 20 p reduced it to 10 mg per day and no change no following diet, chewing mint gum and using oil based vit d still. rec diet, taper pred as tol to floor of 5 mg daily  06/26/2010 ov cc sob and cough ok on Prednisone to 5 mg daily but since taper has more clear nasal drainage and throat clearing.    Pt denies any significant sore throat, dysphagia, itching, sneezing,  nasal congestion or excess/ purulent secretions,  fever, chills, sweats, unintended wt loss, pleuritic or exertional cp, hempoptysis, orthopnea pnd or leg swelling.    Also  denies any obvious fluctuation of symptoms with weather or environmental changes or other aggravating or alleviating factors.    Past Medical History:  Hypothyroidism  Hyperlipidemia  BPH  ?Asthma  - PFT's wnl 07/2005 > no better on symbicort so d/c March 06, 2010  - HFA 50% p coaching December 30, 2009 > 50% February 10, 2010 > 75% February 20, 2010  - Allergy profile neg 02/20/10  Coronary Heart Disease  Interstitial lung dz  - onset ? 2007 with CT Chest 08/10/05 c/w PF esp RUL > LUL  - CT Chest suggestive of asbestosis 09/09/09 non contrasted  - Desat p 3 laps at [employer] office December 30, 2009  - PFTs February 20, 2010 FEV1 1.66 (67%) ratio 80, no better p B2, DLC0 55 > corrects to 123  - ESR 73 February 20, 2010 > Pred x 6 days only> better so started daily prednisone March 06, 2010  - Desats with fast walking 06/26/2010 on pred 5 mg per day with ESR 41  so increased to 10 mg daily          Review of Systems     Objective:   Physical Exam   wt 177 February 10, 2010 > 179 February 20, 2010 . 179 March 05, 2010 > 177 April 03, 2010 > 184 06/26/2010  pleasant amb wm nad, mild hoarseness  HEENT:  nl dentition, turbinates, and orophanx. Nl external ear canals without cough reflex  NECK : without JVD/Nodes/TM/ nl carotid upstrokes bilaterally  LUNGS: no acc muscle use, trace insp crackles bilaterally  CV: RRR no s3 or murmur or increase in P2, no edema  ABD: soft and nontender with nl excursion in the supine position. No bruits or organomegaly, bowel sounds nl  MS: warm without deformities, calf tenderness, cyanosis ? very mild clubbing       Assessment & Plan:

## 2010-06-29 NOTE — Progress Notes (Signed)
Quick Note:  Spoke with pt's spouse and notified of results per Dr. Sherene Sires. Pt verbalized understanding. ______

## 2010-07-10 ENCOUNTER — Encounter: Payer: Self-pay | Admitting: Family Medicine

## 2010-08-11 NOTE — Discharge Summary (Signed)
NAME:  Raymond [name], Raymond [name] NO.:  [id number]   MEDICAL RECORD NO.:  [id number]          PATIENT TYPE:  INP   LOCATION:  2020                         FACILITY:  [facility]   PHYSICIAN:  Madolyn Frieze. Jens Som, MD, FACCDATE OF BIRTH:  [birth year]   DATE OF ADMISSION:  08/07/2007  DATE OF DISCHARGE:  08/08/2007                         DISCHARGE SUMMARY - REFERRING   DISCHARGE DIAGNOSES:  1. Prolonged atypical chest discomfort without evidence of cardiac      ischemia.  2. Orthostatic dizziness with some drop of his blood pressure with      initial standing, may be related to the fact that the patient in      both on Cardura and Hytrin.  3. Hyperlipidemia managed by primary care.  4. Remote tobacco use.  5. History as noted per past medical history below.   BRIEF HISTORY:  Raymond [name] is a 77 year old white male who was  referred to [facility] Emergency Room by his primary care physician for  further evaluation of chest discomfort.  Raymond [name] stated that  approximately 5:00 a.m. on the day of admission while eating breakfast  which consisted of one egg, bacon, coffee and toast, he gradually  developed sharp anterior chest discomfort without radiation,  unassociated with nausea, vomiting or diaphoresis.  He did have some  shortness of breath and extensive burping with intermittent relief.  He  denied water brash.  He returned home and took two baby aspirin and told  his wife of the discomfort. At that time it was a 5 on a scale of 0/10.  He went to his primary care physician's office at approximately 8:30.  EKG did not show any acute changes and he was placed on O2 and IV  started and brought to the emergency room for further evaluation.  At  the time of our evaluation his discomfort was a 1 on a scale of 0/10.  He does admit prior occurrences, however he feels at this time it is the  worst that he has ever had and he cannot recall specific occurrences or  how long ago when  he had his last to occurrence.  He also notes on this  past Thursday he did not feel well and was fatigued while dragging trees  off his lot.   PAST MEDICAL HISTORY:  Is notable for hyperlipidemia, BPH,  hypothyroidism, remote tobacco use.  It was also noted prior to  admission that he is on both Hytrin and Cardura.   LABORATORY:  Admission weight was 85.1 kg.  Hemoglobin and hematocrit  was 15.1 and 44.0, normal indices, platelets 201, WBCs 8.1.  PTT 30, CK  MBs relative indexes and troponins were within normal limits.  I-stat  showed a sodium of 135, potassium 4.3, BUN 22, creatinine 1.1.   Chest x-ray on the 11th showed probable chronic interstitial changes,  particularly the right upper lung field which appeared stable.  No  definitive active processes.   Orthostatic blood pressures on admission showed a lying blood pressure  115/75, pulse of 58.  Sitting 121/77 with pulse 62.  Standing 102/69  with pulse of 71.  One,  three and five minute blood pressures standing  did not show any significant changes from initial standing.   It is noted on admission a C-MET, PT were ordered but not performed.   HOSPITAL COURSE:  Raymond [name] was admitted to [facility]  overnight for observation for atypical chest discomfort.  As the patient  related positional dizziness, orthostatic blood pressures were changed.  He did show a drop of his blood pressure with standing.  It is unclear  as to why the patient is both on Hytrin and Cardura.  However, by the  12th he had not had any further chest discomfort.  Enzymes and EKGs were  negative for myocardial infarction.  Dr. Jens Som felt that the patient  could be discharged home on his preadmission medications with a stress  Myoview today in the office and follow-up appointment with him.   DISPOSITION:  The patient is discharged from [facility].  Wound  care is not applicable.  Activities are not restricted.  He was asked to   maintain low-sodium heart-healthy diet.   DISCHARGE MEDICATIONS:  Discharge medications will remain the same.  These include:  1. Hytrin 10 mg daily.  2. Simvastatin 80 mg nightly.  3. Levothyroxine 125 mcg daily and 25 mcg on Monday, Wednesday and      Friday.  4. Montelukast 10 mg daily.  5. AeroBid b.i.d.  6. Ecotrin 325 daily.  7. Cardura 8 mg half tablet daily.  8. Flonase daily.  9. Omeprazole 20 mg daily.   He was asked to discuss with [name] why he is taking both Hytrin and  Cardura for his prostate.  He will have a stress Myoview on Aug 08, 2007  at 12:00 p.m. at Dr. Ludwig Clarks office and follow up with him on Aug 16, 2007 at 2:45.  He was asked to arrange a 3-4 weeks appointment with Dr.  Christell Constant and to bring all medications to all appointments.  Discharge time  45 minutes.      Joellyn Rued, PA-C      Madolyn Frieze Jens Som, MD, [facility]  Electronically Signed    EW/MEDQ  D:  08/08/2007  T:  08/08/2007  Job:  [id number]   cc:   Dr. Katheran James. Jens Som, MD, [facility]

## 2010-08-11 NOTE — Assessment & Plan Note (Signed)
[facility]                            CARDIOLOGY OFFICE NOTE   NAME:Raymond [name], Raymond [name]                  MRN:          [id number]  DATE:08/16/2007                            DOB:          01-24-1930    Mr. Cafiero is a pleasant 77 year old gentleman who was recently  admitted to [facility] with atypical chest pain.  The patient  did rule out for myocardial infarction with serial enzymes and EKGs  showed no change.  We felt that his pain may have been GI in etiology.  He has ultimately been discharged and had an outpatient Myoview.  This  was performed on Aug 08, 2007.  He exercised for 7 minutes and there are  no ST changes.  His ejection fraction was 70% and there was no ischemia  or infarction.  Since then he has not had chest pain, dyspnea,  palpitations or syncope.   MEDICATIONS:  1. Levofloxacin 25 mcg p.o. daily.  2. Omeprazole 20 mg daily.  3. Aspirin 81 mg daily.  4. Zocor 80 mg daily.  5. Montelukast.  6. Terazosin.  7. Vitamin D.   PHYSICAL EXAM:  Shows a blood pressure 134/81.  His pulse is 66.  He  weighs 188 pounds.  HEENT:  Normal.  His neck is supple with no bruits.  CHEST:  Clear.  CARDIOVASCULAR:  Regular rate and rhythm.  ABDOMEN:  Shows no tenderness.  EXTREMITIES:  Show no edema.   His electrocardiogram shows a sinus rhythm at a rate of 66.  There are  no ST changes noted.   DIAGNOSES:  1. Recent admission for atypical chest pain - He has had no further      symptoms and his Myoview was normal.  We will not pursue further      ischemia evaluation.  I have asked him to follow up with [name]      for his primary care issues.  2. Hyperlipidemia - He will continue on his statin, this is being      managed by [name].  3. History of benign prostatic hypertrophy.  4. Hypothyroidism.   We will see him back on as-needed basis.     Madolyn Frieze Jens Som, MD, [facility]  Electronically Signed    BSC/MedQ  DD:  08/16/2007  DT: 08/16/2007  Job #: [id number]   cc:   Ernestina Penna, M.D.

## 2010-08-11 NOTE — H&P (Signed)
NAME:  Raymond [name], Raymond [name] NO.:  [id number]   MEDICAL RECORD NO.:  [id number]          PATIENT TYPE:  INP   LOCATION:  2020                         FACILITY:  [facility]   PHYSICIAN:  Madolyn Frieze. Jens Som, MD, FACCDATE OF BIRTH:  1929-12-13   DATE OF ADMISSION:  08/07/2007  DATE OF DISCHARGE:                              HISTORY & PHYSICAL   BRIEF HISTORY:  Mr. Nunley is a 77 year old white male who was  referred to [facility] emergency room from his primary care physician  secondary to chest discomfort..  Mr. Magner states that he usually  eats out every morning at approximately 5 a.m. while eating breakfast  which consisted of 1 egg, bacon, coffee, and toast.  He gradually  developed a sharp anterior chest discomfort that did not radiate.  It  was associated with shortness of breath.  However, he denied nausea,  vomiting, and diaphoresis.  He also noticed increased amount of burping  but he denied water brash.  He returned home and took 2 baby aspirin and  told his wife.  Thus, they went to the primary care physician office  around 8:30 for further evaluation.  An EKG in his office did not show  any acute changes and he was placed on oxygen and an IV.  At that time,  his discomfort was a 5 on a scale of 0/10.  By the time he arrived to  the emergency room, his discomfort was only one.  Mr. Blanton does  admit to prior occurrences, however, he thinks that this time it was  particularly worse.  He cannot recall the last time.  He states that you  get older you tend to blow off various aches and pains.  He has  noticed some general fatigue, particularly last Thursday while dragging  dog going to trees.  He stated that he did not feel well.  He also  relates a 2-week history of positional dizziness but he denies syncope.   PAST MEDICAL HISTORY:   ALLERGIES:  No known drug allergies.   MEDICATIONS:  Prior to admission include:  1. Hytrin 10 mg at bedtime.  2. Cardura  8 mg half tablet daily.  3. Simvastatin 80 mg daily.  4. Levothyroxine 125 mcg daily and 25 mcg Monday, Wednesday, and      Friday.  5. Montelukast 10 mg daily.  6. AeroBid b.i.d.  7. Ecotrin 325 daily.  8. Flonase daily.  9. Omeprazole 20 mg daily.   He has a history of hyperlipidemia.  Actual lipid profile was performed  on May 05, 2007.  His LDL particle number was [id number], small LDL  particle number was 1006.  LDC calculated was 74, HDL was 44, LDL  particles size was 20, large HDL particle was 9.8, large VLDLP was 0.0.  He has a history of hypothyroidism.  Last TSH check was on May 05, 2007.  This was 2.103 within normal limits.  He also has a history of  BPH and microscopic hematuria.  He has a history of left rotator cuff  shock, left cataract repair, and cholecystectomy in 83.  He specifically  denies any diabetes, hypertension, myocardial infarction, severe  bleeding, dyscrasias, or renal dysfunction.   SOCIAL HISTORY:  He resides in [place] with his wife.  He does not  have any children.  He is currently employed as a part-time Optometrist.  He has not smoked since 1982, prior to that he  smoked 3 packs a day for 29 years.  He does drink beer approximately 1  time per month.  Denies any drugs, herbal medications, or specific diet.  He plays golf and enjoys yard work without difficulty.   FAMILY HISTORY:  His mother died at the age of 13 with rheumatic heart  disease.  She states she was in a iron lung at the time.  His father  died at the age of 53 with a history of several strokes and tobacco use.  He has 3 brothers and 1 sister.  He thinks three of them have high blood  pressure and one has high cholesterol.   REVIEW OF SYSTEMS:  In addition to the above, it is notable for night  sweats, glasses, partial dentures, rosacea, occasional wheezing, urinary  straining and nocturia.  Last bowel movement was this morning.  Otherwise, all systems  are unremarkable.   PHYSICAL EXAMINATION:  GENERAL:  Well-nourished, well-developed pleasant  white male in no apparent distress.  Wife and nephew are present.  Blood  pressure 136/75, temperature 97.3, pulse 62, respirations 18, 97% sats  on room air.  HEENT:  Unremarkable.  NECK:  Supple without thyromegaly, adenopathy, JVD, or carotid bruits.  CHEST:  Symmetrical excursion.  LUNGS:  Sounds were clear to auscultation without rales, rhonchi, or  wheezing.  SKIN:  Integument was intact, did not appreciate any rashes or lesions.  HEART:  PMI is not displaced.  Regular rate and rhythm.  Do not  appreciate any murmurs, rubs, clicks or gallops.  All pulses are  symmetrical and intact.  Did not appreciate any abdominal or femoral  bruits.  ABDOMEN:  Obese.  Bowel sounds present without organomegaly, masses, or  tenderness.  EXTREMITIES:  Negative cyanosis, clubbing, or edema.  MUSCULOSKELETAL:  Unremarkable.  He specifically do not have any  reproducible discomfort with upper extremity full range of motion or  palpation.  NEUROLOGY:  Unremarkable.   Chest x-ray show  probable chronic interstitial change, particularly in  the right upper lobe.  No active disease.  EKG shows normal sinus  rhythm, left axis deviation, nonspecific ST-T wave changes, baseline  artifact.  H and H is 15.1 and 44.0, normal indices, platelets 201, WBCs  8.1, remaining labs are pending.   IMPRESSION:  1. Prolonged chest discomfort of uncertain etiology.  2. Recent positional dizziness.  3. History as noted per past medical history.   DISPOSITION:  Dr. Jens Som reviewed the patient's history, spoke with,  and examined the patient and agrees with the above.  We will admit him  to the hospital for observation.  We will continue his home medications  as well as  check his orthostatic blood pressures.  In anticipation that he rules  out for myocardial infarction, I have arranged a stress Myoview for the  patient  at Dr. Ludwig Clarks office on Aug 08, 2007 at 12 p.m..  He will  then follow up with Dr. Jens Som on Aug 16, 2007 at 2:45.      Joellyn Rued, PA-C      Madolyn Frieze Jens Som, MD, [facility]  Electronically Signed    EW/MEDQ  D:  08/07/2007  T:  08/08/2007  Job:  [id number]   cc:   Ernestina Penna, M.D.

## 2010-09-10 ENCOUNTER — Other Ambulatory Visit: Payer: Self-pay | Admitting: *Deleted

## 2010-09-10 MED ORDER — PREDNISONE 10 MG PO TABS: 10.0000 mg | ORAL_TABLET | Freq: Every day | ORAL | Status: DC

## 2010-09-24 ENCOUNTER — Ambulatory Visit (INDEPENDENT_AMBULATORY_CARE_PROVIDER_SITE_OTHER)
Admission: RE | Admit: 2010-09-24 | Discharge: 2010-09-24 | Disposition: A | Discharge status: Home / Self Care | Payer: Medicare Other | Source: Ambulatory Visit | Attending: Adult Health | Admitting: Adult Health

## 2010-09-24 ENCOUNTER — Ambulatory Visit (INDEPENDENT_AMBULATORY_CARE_PROVIDER_SITE_OTHER): Payer: Medicare Other | Admitting: Adult Health

## 2010-09-24 ENCOUNTER — Encounter: Payer: Self-pay | Admitting: Adult Health

## 2010-09-24 ENCOUNTER — Other Ambulatory Visit (INDEPENDENT_AMBULATORY_CARE_PROVIDER_SITE_OTHER): Payer: Medicare Other

## 2010-09-24 VITALS — BP 106/58 | HR 72 | Temp 97.7°F | Ht 66.0 in | Wt 181.6 lb

## 2010-09-24 DIAGNOSIS — J8410 Pulmonary fibrosis, unspecified: Secondary | ICD-10-CM

## 2010-09-24 DIAGNOSIS — J309 Allergic rhinitis, unspecified: Secondary | ICD-10-CM | POA: Insufficient documentation

## 2010-09-24 LAB — SEDIMENTATION RATE: Sed Rate: 23 mm/hr — ABNORMAL HIGH (ref 0–22)

## 2010-09-24 NOTE — Patient Instructions (Signed)
Take Zyrtec10mg  daily  Add Chlor tabs 4mg  2 At bedtime   Avoid throat clearing , NO MINTS, use sugarless candy, ice chips to sooth throat.  Decrease Prednisone 5mg  daily  Now, then August 1st take 5mg   Every other day x 1 month and then stop  follow up 2 months and As needed   Please contact office for sooner follow up if symptoms do not improve or worsen or seek emergency care

## 2010-09-24 NOTE — Assessment & Plan Note (Signed)
Compensated .  Will check cxr and ESR today  Plan for now is to : (adjust pending labs/xray) Decrease Prednisone 5mg  daily  Now, then August 1st take 5mg   Every other day x 1 month and then stop  follow up 2 months and As needed   Please contact office for sooner follow up if symptoms do not improve or worsen or seek emergency care

## 2010-09-24 NOTE — Progress Notes (Signed)
Subjective:    Patient ID: Raymond [name], male    DOB: 05-Jul-1929, 77 y.o.   MRN: [id number]  HPI   Primary Provider/Referring Provider: [name]     History of Present Illness:  33 yowm quit smoking in 1982 with improvement but never complete resolution in cough since then referred by Dr Christell Constant for doe starting in the summer of 2011 and PF on cxr/ct.   December 30, 2009 1st pulmonary office eval cc doe x rapid walk or uphills but does ok flat indolent onset but not progressive since around June 2011, cough is present x years, worse with colds, no better with symbicort, better breathing and coughing after symbicort and prednisone together. hfa poor, rec work on technique and gerd diet/ ppi two times a day.   February 10, 2010 ov sob no better but has learned to pace himself. following diet and using ppi/ symbiocort but still struggling with hfa. rec work on hfa, come to [employer] office for pft   February 20, 2010 ov cc worsening doe, still struggling with hfa. minimal cough. ESR 73 rec short course of prednisone helped, never took tramadol   March 05, 2010 ov some better esp cough with delsym and prednisone, did not take tramadol as rec but didn't really need it. Delsym if needed for cough  start prednisone 10 mg 2 daily with bfast until improve then 1 daily  Symbicort  stop   April 03, 2010 ov cough and breathing both better to his satisfaction on the 20 p reduced it to 10 mg per day and no change no following diet, chewing mint gum and using oil based vit d still. rec diet, taper pred as tol to floor of 5 mg daily  06/26/2010 ov cc sob and cough ok on Prednisone to 5 mg daily but since taper has more clear nasal drainage and throat clearing.  >>pred 10mg    09/24/10 Follow up  Pt returns for 3 month follow up pulmonary fibrosis. Since last ov he feel dyspnea is unchanged. Main compliant is post nasal drainage, throat clearing-mostly during daytime. Has minimal cough w/ clear mucus at times  w/ no flare. Continue on prednisone 10mg  . No significant change with increased steroid dose.  Took zyrtec without much help.  No discolored mucus or fever    Past Medical History:  Hypothyroidism  Hyperlipidemia  BPH  ?Asthma  - PFT's wnl 07/2005 > no better on symbicort so d/c March 06, 2010  - HFA 50% p coaching December 30, 2009 > 50% February 10, 2010 > 75% February 20, 2010  - Allergy profile neg 02/20/10  Coronary Heart Disease  Interstitial lung dz  - onset ? 2007 with CT Chest 08/10/05 c/w PF esp RUL > LUL  - CT Chest suggestive of asbestosis 09/09/09 non contrasted  - Desat p 3 laps at [facility] office December 30, 2009  - PFTs February 20, 2010 FEV1 1.66 (67%) ratio 80, no better p B2, DLC0 55 > corrects to 123  - ESR 73 February 20, 2010 > Pred x 6 days only> better so started daily prednisone March 06, 2010  - Desats with fast walking 06/26/2010 on pred 5 mg per day with ESR 41  so increased to 10 mg daily       Review of Systems Constitutional:   No  weight loss, night sweats,  Fevers, chills, fatigue, or  lassitude.  HEENT:   No headaches,  Difficulty swallowing,  Tooth/dental problems, or  Sore throat,                +  sneezing, itching,  nasal congestion, post nasal drip,   CV:  No chest pain,  Orthopnea, PND, swelling in lower extremities, anasarca, dizziness, palpitations, syncope.   GI  No heartburn, indigestion, abdominal pain, nausea, vomiting, diarrhea, change in bowel habits, loss of appetite, bloody stools.   Resp:   No coughing up of blood.   No chest wall deformity  Skin: no rash or lesions.  GU: no dysuria, change in color of urine, no urgency or frequency.  No flank pain, no hematuria   MS:  No joint pain or swelling.  No decreased range of motion.  No back pain.  Psych:  No change in mood or affect. No depression or anxiety.  No memory loss.          Objective:   Physical Exam   wt 177 February 10, 2010 > 179 February 20, 2010 . 179 March 05, 2010 > 177 April 03, 2010 > 184 06/26/2010 >181 09/24/2010  pleasant amb wm nad, mild hoarseness  HEENT: nl dentition, turbinates, and orophanx. Nl external ear canals without cough reflex  NECK : without JVD/Nodes/TM/ nl carotid upstrokes bilaterally  LUNGS: no acc muscle use, trace insp crackles bilaterally  CV: RRR no s3 or murmur or increase in P2, no edema  ABD: soft and nontender with nl excursion in the supine position. No bruits or organomegaly, bowel sounds nl  MS: warm without deformities, calf tenderness, cyanosis ? very mild clubbing       Assessment & Plan:

## 2010-09-24 NOTE — Assessment & Plan Note (Signed)
Add back zyrtec daily  Add chlor tabs 4mg  2 qhs

## 2010-09-25 ENCOUNTER — Ambulatory Visit: Payer: Medicare Other | Admitting: Internal Medicine

## 2010-11-27 ENCOUNTER — Ambulatory Visit: Payer: Medicare Other | Admitting: Adult Health

## 2010-11-27 ENCOUNTER — Ambulatory Visit: Payer: Medicare Other | Admitting: Internal Medicine

## 2010-12-04 ENCOUNTER — Ambulatory Visit: Payer: Medicare Other | Admitting: Adult Health

## 2010-12-07 ENCOUNTER — Encounter: Payer: Self-pay | Admitting: Internal Medicine

## 2010-12-07 ENCOUNTER — Ambulatory Visit (INDEPENDENT_AMBULATORY_CARE_PROVIDER_SITE_OTHER): Payer: Medicare Other | Admitting: Internal Medicine

## 2010-12-07 ENCOUNTER — Ambulatory Visit (INDEPENDENT_AMBULATORY_CARE_PROVIDER_SITE_OTHER)
Admission: RE | Admit: 2010-12-07 | Discharge: 2010-12-07 | Disposition: A | Discharge status: Home / Self Care | Payer: Medicare Other | Source: Ambulatory Visit | Attending: Internal Medicine | Admitting: Internal Medicine

## 2010-12-07 ENCOUNTER — Ambulatory Visit: Payer: Medicare Other | Admitting: Adult Health

## 2010-12-07 ENCOUNTER — Other Ambulatory Visit (INDEPENDENT_AMBULATORY_CARE_PROVIDER_SITE_OTHER): Payer: Medicare Other

## 2010-12-07 VITALS — BP 118/62 | HR 85 | Temp 97.3°F | Ht 67.0 in | Wt 182.2 lb

## 2010-12-07 DIAGNOSIS — J8410 Pulmonary fibrosis, unspecified: Secondary | ICD-10-CM

## 2010-12-07 DIAGNOSIS — R05 Cough: Secondary | ICD-10-CM

## 2010-12-07 LAB — CBC WITH DIFFERENTIAL/PLATELET
Basophils Absolute: 0 10*3/uL (ref 0.0–0.1)
Eosinophils Absolute: 0.2 10*3/uL (ref 0.0–0.7)
Hemoglobin: 14.2 g/dL (ref 13.0–17.0)
Lymphocytes Relative: 19.2 % (ref 12.0–46.0)
MCHC: 33.4 g/dL (ref 30.0–36.0)
Monocytes Relative: 6.6 % (ref 3.0–12.0)
Neutrophils Relative %: 71.4 % (ref 43.0–77.0)
Platelets: 226 10*3/uL (ref 150.0–400.0)
RDW: 13.8 % (ref 11.5–14.6)

## 2010-12-07 LAB — SEDIMENTATION RATE: Sed Rate: 58 mm/hr — ABNORMAL HIGH (ref 0–22)

## 2010-12-07 MED ORDER — PREDNISONE 10 MG PO TABS: ORAL_TABLET | ORAL | Status: DC

## 2010-12-07 NOTE — Assessment & Plan Note (Signed)
Most c/w  Classic Upper airway cough syndrome, so named because it's frequently impossible to sort out how much is  CR/sinusitis with freq throat clearing (which can be related to primary GERD)   vs  causing  secondary (" extra esophageal")  GERD from wide swings in gastric pressure that occur with throat clearing, often  promoting self use of mint and menthol lozenges that reduce the lower esophageal sphincter tone and exacerbate the problem further in a cyclical fashion.   These are the same pts who not infrequently have failed to tolerate ace inhibitors,  dry powder inhalers or biphosphonates or report having reflux symptoms that don't respond to standard doses of PPI , and are easily confused as having aecopd or asthma flares,  Not likely directly related to IPF.  No better on zyrtec so ok to stop.

## 2010-12-07 NOTE — Patient Instructions (Addendum)
Please see patient coordinator before you leave today  to schedule Sinus CT  If condition worsens start prednisone at 40 mg daily until better then 20 mg daily thereafter  We will see you monthly in the [place] office - sooner in [place] if needed

## 2010-12-07 NOTE — Progress Notes (Signed)
Subjective:    Patient ID: Raymond [name], male    DOB: 1929/08/21, 77 y.o.   MRN: [id number]  HPI   Primary Provider/Referring Provider: [name]     History of Present Illness:  47 yowm quit smoking in 1982 with improvement but never complete resolution in cough since then referred by Dr Christell Constant for doe starting in the summer of 2011 and PF on cxr/ct.   December 30, 2009 1st pulmonary office eval cc doe x rapid walk or uphills but does ok flat indolent onset but not progressive since around June 2011, cough is present x years, worse with colds, no better with symbicort, better breathing and coughing after symbicort and prednisone together. hfa poor, rec work on technique and gerd diet/ ppi two times a day.   February 10, 2010 ov sob no better but has learned to pace himself. following diet and using ppi/ symbiocort but still struggling with hfa. rec work on hfa, come to [facility] office for pft   February 20, 2010 ov cc worsening doe, still struggling with hfa. minimal cough. ESR 73 rec short course of prednisone helped, never took tramadol   March 05, 2010 ov some better esp cough with delsym and prednisone, did not take tramadol as rec but didn't really need it. Delsym if needed for cough  start prednisone 10 mg 2 daily with bfast until improve then 1 daily  Symbicort  stop   April 03, 2010 ov cough and breathing both better to his satisfaction on the 20 p reduced it to 10 mg per day and no change no following diet, chewing mint gum and using oil based vit d still. rec diet, taper pred as tol to floor of 5 mg daily  06/26/2010 ov cc sob and cough ok on Prednisone to 5 mg daily but since taper has more clear nasal drainage and throat clearing.  >>pred 10mg    09/24/10 Follow up  Pt returns for 3 month follow up pulmonary fibrosis. Since last ov he feel dyspnea is unchanged. Main compliant is post nasal drainage, throat clearing-mostly during daytime. Has minimal cough w/ clear mucus at times  w/ no flare. Continue on prednisone 10mg  . No significant change with increased steroid dose.  Took zyrtec without much help.  No discolored mucus or fever  Take Zyrtec10mg  daily  Add Chlor tabs 4mg  2 At bedtime   Avoid throat clearing , NO MINTS, use sugarless candy, ice chips to sooth throat.  Decrease Prednisone 5mg  daily  Now, then August 1st take 5mg   Every other day x 1 month and then stop      12/07/2010 f/u ov/Regie Bunner off prednisone since Sept 1  cc sob with more than slow walkin but not convinced he's worse off prednisone at this point. no change cough, sense of pnds on or off prednisone nor asoc arthrtiris .  Sleeping ok without nocturnal  or early am exac of resp c/o's or need for noct saba.   Sleeping ok without nocturnal  or early am exacerbation  of respiratory  c/o's  Also denies any obvious fluctuation of symptoms with weather or environmental changes or other aggravating or alleviating factors except as outlined above   ROS  At present neg for  any significant sore throat, dysphagia, itching, sneezing,  nasal congestion or excess/ purulent secretions,  fever, chills, sweats, unintended wt loss, pleuritic or exertional cp, hempoptysis, orthopnea pnd or leg swelling.  Also denies presyncope, palpitations, heartburn, abdominal pain, nausea, vomiting, diarrhea  or  change in bowel or urinary habits, dysuria,hematuria,  rash, arthralgias, visual complaints, headache, numbness weakness or ataxia.      Past Medical History:  Hypothyroidism  Hyperlipidemia  BPH  ?Asthma  - PFT's wnl 07/2005 > no better on symbicort so d/c March 06, 2010  - HFA 50% p coaching December 30, 2009 > 50% February 10, 2010 > 75% February 20, 2010  - Allergy profile neg 02/20/10  Coronary Heart Disease  Interstitial lung dz  - onset ? 2007 with CT Chest 08/10/05 c/w PF esp RUL > LUL  - CT Chest suggestive of asbestosis 09/09/09 non contrasted                   Objective:   Physical Exam   wt  177 February 10, 2010 >  > 184 06/26/2010 >181 09/24/2010   > 182 12/07/2010  pleasant amb wm nad, mild hoarseness  HEENT: nl dentition, turbinates, and orophanx. Nl external ear canals without cough reflex  NECK : without JVD/Nodes/TM/ nl carotid upstrokes bilaterally  LUNGS: no acc muscle use, trace insp crackles bilaterally  CV: RRR no s3 or murmur or increase in P2, no edema  ABD: soft and nontender with nl excursion in the supine position. No bruits or organomegaly, bowel sounds nl  MS: warm without deformities, calf tenderness, cyanosis  POS mild clubbing       Assessment & Plan:

## 2010-12-07 NOTE — Assessment & Plan Note (Addendum)
DDx for pulmonary fibrosis  includes idiopathic pulmonary fibrosis, pulmonary fibrosis associated with rheumatologic diseases (which have a relatively benign course in most cases) , adverse effect from  drugs such as chemotherapy or amiodarone exposure, nonspecific interstitial pneumonia which is typically steroid responsive, and chronic hypersensitivity pneumonitis.  Asbestosis suggested radiographically but here is no def exposure and this is not assoc with high esr or response to steroids   In active  smokers Langerhan's Cell  Histiocyctosis (eosinophilic granuomatosis),  DIP,  and Respiratory Bronchiolitis ILD also need to be considered,  But these aren't likely in the absence of active smoking.   Discussed in detail all the  indications, usual  risks and alternatives  relative to the benefits with patient who agrees to proceed with no rx for now Since not convinced he's worse ok to hold prednisone unless worsens- gave him option of [facility] eval, declined

## 2010-12-08 NOTE — Progress Notes (Signed)
Quick Note:  Spoke with pt and notified of results per Dr. Wert. Pt verbalized understanding and denied any questions.  ______ 

## 2010-12-08 NOTE — Progress Notes (Signed)
Quick Note:  Spoke with pt's spouse and notified of results per Dr. Sherene Sires. She verbalized understanding and denied any questions. Will inform the pt.  ______

## 2010-12-29 ENCOUNTER — Encounter: Payer: Self-pay | Admitting: Internal Medicine

## 2010-12-29 ENCOUNTER — Ambulatory Visit (INDEPENDENT_AMBULATORY_CARE_PROVIDER_SITE_OTHER): Payer: Medicare Other | Admitting: Internal Medicine

## 2010-12-29 VITALS — BP 110/70 | HR 71 | Temp 98.2°F | Ht 67.0 in | Wt 180.0 lb

## 2010-12-29 DIAGNOSIS — J8410 Pulmonary fibrosis, unspecified: Secondary | ICD-10-CM

## 2010-12-29 MED ORDER — PREDNISONE 10 MG PO TABS: ORAL_TABLET | ORAL | Status: DC

## 2010-12-29 NOTE — Patient Instructions (Signed)
Rec you check vit D level and let Dr Christell Constant check your bone density.  The goal with prednisone use it to find the lowest effective dose so don't taper it below 10 mg per day and slow your taper as follows  If ok after 2 weeks, try Prednisone 20mg  on even days and 10 on odd  If after 2 weeks try Prednisone 10 mg daily as your new floor  If worsen go back to the previous dose that worked.  Please schedule a follow up visit in 3 months but call sooner if needed in the [place] office is fine.

## 2010-12-29 NOTE — Progress Notes (Signed)
Subjective:    Patient ID: Raymond [name], male    DOB: 1930/02/11, 77 y.o.   MRN: [id number]  HPI   Primary Provider/Referring Provider: Dr. Christell [name]     History of Present Illness:  64 yowm quit smoking in 1982 with improvement but never complete resolution in cough since then referred by Dr Raymond [name] for doe starting in the summer of 2011 and PF on cxr/ct.   December 30, 2009 1st pulmonary office eval cc doe x rapid walk or uphills but does ok flat indolent onset but not progressive since around June 2011, cough is present x years, worse with colds, no better with symbicort, better breathing and coughing after symbicort and prednisone together. hfa poor, rec work on technique and gerd diet/ ppi two times a day.   February 10, 2010 ov sob no better but has learned to pace himself. following diet and using ppi/ symbicort but still struggling with hfa. rec work on hfa, come to [facility] office for pft   February 20, 2010 ov cc worsening doe, still struggling with hfa. minimal cough. ESR 73 rec short course of prednisone helped, never took tramadol.  March 05, 2010 ov some better esp cough with delsym and prednisone, did not take tramadol as rec but didn't really need it. Delsym if needed for cough  start prednisone 10 mg 2 daily with bfast until improve then 1 daily  Symbicort  stop   April 03, 2010 ov cough and breathing both better to his satisfaction on the 20 p reduced it to 10 mg per day and no change no following diet, chewing mint gum and using oil based vit d still. rec diet, taper pred as tol to floor of 5 mg daily  06/26/2010 ov cc sob and cough ok on Prednisone to 5 mg daily but since taper has more clear nasal drainage and throat clearing.  >>pred 10mg    09/24/10 Follow up  Pt returns for 3 month follow up pulmonary fibrosis. Since last ov he feel dyspnea is unchanged. Main compliant is post nasal drainage, throat clearing-mostly during daytime. Has minimal cough w/ clear mucus at times  w/ no flare. Continue on prednisone 10mg  . No significant change with increased steroid dose.  Took zyrtec without much help.  No discolored mucus or fever  Take Zyrtec10mg  daily  Add Chlor tabs 4mg  2 At bedtime   Avoid throat clearing , NO MINTS, use sugarless candy, ice chips to sooth throat.  Decrease Prednisone 5mg  daily  Now, then August 1st take 5mg   Every other day x 1 month and then stop      12/07/2010 f/u ov/Raymond [name] off prednisone since Sept 1  cc sob with more than slow walking but not convinced he's worse off prednisone at this point. no change cough, sense of pnds on or off prednisone nor assoc arthrtiris  rec Please see patient coordinator before you leave today  to schedule Sinus CT> min thickening only  If condition worsens start prednisone at 40 mg daily until better then 20 mg daily thereafter.     12/29/2010 f/u ov/Raymond [name] cc much better p restarting prednisone 9/13 @ 40 mg per day. Tapered to 20 mg per day. Cough also much better.  Concerned about wt gain but he's been much more active since pred rx and no sign wt gain yet.    Sleeping ok without nocturnal  or early am exacerbation  of respiratory  c/o's  Also denies any obvious fluctuation of symptoms with weather or  environmental changes or other aggravating or alleviating factors except as outlined above   ROS  At present neg for  any significant sore throat, dysphagia, itching, sneezing,  nasal congestion or excess/ purulent secretions,  fever, chills, sweats, unintended wt loss, pleuritic or exertional cp, hempoptysis, orthopnea pnd or leg swelling.  Also denies presyncope, palpitations, heartburn, abdominal pain, nausea, vomiting, diarrhea  or change in bowel or urinary habits, dysuria,hematuria,  rash, arthralgias, visual complaints, headache, numbness weakness or ataxia.      Past Medical History:  Hypothyroidism  Hyperlipidemia  BPH  ?Asthma  - PFT's wnl 07/2005 > no better on symbicort so d/c March 06, 2010  -  HFA 50% p coaching December 30, 2009 > 50% February 10, 2010 > 75% February 20, 2010  - Allergy profile neg 02/20/10  Coronary Heart Disease  Interstitial lung dz  - onset ? 2007 with CT Chest 08/10/05 c/w PF esp RUL > LUL  - CT Chest suggestive of asbestosis 09/09/09 non contrasted  - Daily prednisone rx since 12/09/2010                   Objective:   Physical Exam   wt 177 February 10, 2010 >  > 184 06/26/2010 >181 09/24/2010   > 182 12/07/2010 > 12/29/2010  180 pleasant amb wm nad, mild hoarseness  HEENT: nl dentition, turbinates, and orophanx. Nl external ear canals without cough reflex  NECK : without JVD/Nodes/TM/ nl carotid upstrokes bilaterally  LUNGS: no acc muscle use, trace insp crackles bilaterally  CV: RRR no s3 or murmur or increase in P2, no edema  ABD: soft and nontender with nl excursion in the supine position. No bruits or organomegaly, bowel sounds nl  MS: warm without deformities, calf tenderness, cyanosis  POS mild clubbing       Assessment & Plan:

## 2010-12-29 NOTE — Assessment & Plan Note (Signed)
Clearly has a steroid responsive component  The goal with a chronic steroid dependent illness is always arriving at the lowest effective dose that controls the disease/symptoms and not accepting a set "formula" which is based on statistics or guidelines that don't always take into account patient  variability or the natural hx of the dz in every individual patient, which may well vary over time.  For now therefore I recommend the patient maintain  A floor of 10 mg per day  See instructions for specific recommendations which were reviewed directly with the patient who was given a copy with highlighter outlining the key components.   Needs close monitoring of bone density/ vit d levels on prednisone chronically for foreseeable future

## 2011-01-07 ENCOUNTER — Telehealth: Payer: Self-pay | Admitting: Internal Medicine

## 2011-01-07 MED ORDER — PREDNISONE 10 MG PO TABS: ORAL_TABLET | ORAL | Status: DC

## 2011-01-07 NOTE — Telephone Encounter (Signed)
Rx for prednisone refilled. Spoke with pt and notified rx was sent and he states nothing further needed.

## 2011-04-13 ENCOUNTER — Encounter: Payer: Self-pay | Admitting: Internal Medicine

## 2011-04-13 ENCOUNTER — Ambulatory Visit (INDEPENDENT_AMBULATORY_CARE_PROVIDER_SITE_OTHER): Payer: Medicare Other | Admitting: Internal Medicine

## 2011-04-13 VITALS — BP 120/72 | HR 100 | Temp 97.2°F | Ht 66.0 in | Wt 182.0 lb

## 2011-04-13 DIAGNOSIS — J8410 Pulmonary fibrosis, unspecified: Secondary | ICD-10-CM

## 2011-04-13 MED ORDER — PREDNISONE 10 MG PO TABS: ORAL_TABLET | ORAL | Status: DC

## 2011-04-13 MED ORDER — FLUTICASONE-SALMETEROL 250-50 MCG/DOSE IN AEPB: 1.0000 | INHALATION_SPRAY | Freq: Two times a day (BID) | RESPIRATORY_TRACT | Status: DC

## 2011-04-13 NOTE — Assessment & Plan Note (Signed)
-   onset ? 2007 with CT Chest 08/10/05 c/w PF esp RUL > LUL  - CT Chest suggestive of asbestosis 09/09/09 non contrasted  - Desat p 3 laps at [facility] office December 30, 2009  - PFTs February 20, 2010 FEV1 1.66 (67%) ratio 80, no better p B2, DLC0 55 > corrects to 123  - ESR 73 February 20, 2010 > Pred x 6 days only> better so started daily prednisone March 06, 2010  - Desats with fast walking 06/26/2010 on pred 5 mg per day with ESR 41  so increased to 10 mg daily > d/c 11/28/10 - 12/07/2010  Walked RA  2 laps @ 185 ft each stopped due to  desat to 85%,  ESR 58 > pred restarted 12/09/10  The goal with a chronic steroid dependent illness is always arriving at the lowest effective dose that controls the disease/symptoms and not accepting a set "formula" which is based on statistics or guidelines that don't always take into account patient  variability or the natural hx of the dz in every individual patient, which may well vary over time.  For now therefore I recommend the patient maintain  A floor of 10 mg per day and rechallenge with advair 250 (he  Says this helped the most of anything) to see if allows reduction from ceiling of 20 mg per day he's presently maintaining  The proper method of use, as well as anticipated side effects, of this dry powder inhaler are discussed and demonstrated to the patient.  Achieved 90% effective technique with coaching

## 2011-04-13 NOTE — Patient Instructions (Signed)
Start Advair 250  One puff twice a day, smooth deep breath then rinse and gargle  The goal with prednisone use it to find the lowest effective dose so don't taper it below 10 mg per day and slow your taper as follows   try Prednisone 20mg  on even days and 10 on odd  If after 2 weeks try Prednisone 10 mg daily as your new floor  If worsen go back to the previous dose that worked.  Please schedule a follow up visit in 6 weeks at the [place] office

## 2011-04-13 NOTE — Progress Notes (Signed)
Subjective:    Patient ID: Raymond [name], male    DOB: Sep 20, 1929    MRN: [id number]  HPI   Primary Provider/Referring Provider: [name]     History of Present Illness:  52 yowm quit smoking in 1982 with improvement but never complete resolution in cough since then referred by Dr Christell Constant for doe starting in the summer of 2011 and PF on cxr/ct.   December 30, 2009 1st pulmonary office eval cc doe x rapid walk or uphills but does ok flat indolent onset but not progressive since around June 2011, cough is present x years, worse with colds, no better with symbicort, better breathing and coughing after symbicort and prednisone together. hfa poor, rec work on technique and gerd diet/ ppi two times a day.   February 10, 2010 ov sob no better but has learned to pace himself. following diet and using ppi/ symbicort but still struggling with hfa. rec work on hfa, come to [facility] office for pft   February 20, 2010 ov cc worsening doe, still struggling with hfa. minimal cough. ESR 73 rec short course of prednisone helped, never took tramadol.  March 05, 2010 ov some better esp cough with delsym and prednisone, did not take tramadol as rec but didn't really need it. Delsym if needed for cough  start prednisone 10 mg 2 daily with bfast until improve then 1 daily  Symbicort  stop   April 03, 2010 ov cough and breathing both better to his satisfaction on the 20 p reduced it to 10 mg per day and no change no following diet, chewing mint gum and using oil based vit d still. rec diet, taper pred as tol to floor of 5 mg daily  06/26/2010 ov cc sob and cough ok on Prednisone to 5 mg daily but since taper has more clear nasal drainage and throat clearing.  >>pred 10mg    09/24/10 Follow up  Pt returns for 3 month follow up pulmonary fibrosis. Since last ov he feel dyspnea is unchanged. Main compliant is post nasal drainage, throat clearing-mostly during daytime. Has minimal cough w/ clear mucus at times w/ no  flare. Continue on prednisone 10mg  . No significant change with increased steroid dose.  Took zyrtec without much help.  No discolored mucus or fever  Take Zyrtec10mg  daily  Add Chlor tabs 4mg  2 At bedtime   Avoid throat clearing , NO MINTS, use sugarless candy, ice chips to sooth throat.  Decrease Prednisone 5mg  daily  Now, then August 1st take 5mg   Every other day x 1 month and then stop      12/07/2010 f/u ov/[name] off prednisone since Sept 1  cc sob with more than slow walking but not convinced he's worse off prednisone at this point. no change cough, sense of pnds on or off prednisone nor assoc arthrtiris  rec Please see patient coordinator before you leave today  to schedule Sinus CT> min thickening only  If condition worsens start prednisone at 40 mg daily until better then 20 mg daily thereafter.     12/29/2010 f/u ov/[name] cc much better p restarting prednisone 9/13 @ 40 mg per day. Tapered to 20 mg per day. Cough also much better.  Concerned about wt gain but he's been much more active since pred rx and no sign wt gain yet.  rec Rec you check vit D level and let Dr Christell Constant check your bone density. The goal with prednisone use it to find the lowest effective dose so  don't taper it below 10 mg per day and slow your taper as follows If ok after 2 weeks, try Prednisone 20mg  on even days and 10 on odd  If after 2 weeks try Prednisone 10 mg daily as your new floor  If worsen go back to the previous dose that worked.   04/13/2011 f/u ov/[name]  Pt is currently taking 20mg  prednisone a day. He states when he tried to decrease the dose (within  Few days) he had increased SOB and cough. Pt staes [name] checked his vitamin d level and it was normal.   Cc doe x get in a hurry, if takes time can walk anywhere he wants, ok mailbox and back if goes slow   Sleeping ok without nocturnal  or early am exacerbation  of respiratory  c/o's  Also denies any obvious fluctuation of symptoms with weather  or environmental changes or other aggravating or alleviating factors except as outlined above   ROS  At present neg for  any significant sore throat, dysphagia, itching, sneezing,  nasal congestion or excess/ purulent secretions,  fever, chills, sweats, unintended wt loss, pleuritic or exertional cp, hempoptysis, orthopnea pnd or leg swelling.  Also denies presyncope, palpitations, heartburn, abdominal pain, nausea, vomiting, diarrhea  or change in bowel or urinary habits, dysuria,hematuria,  rash, arthralgias, visual complaints, headache, numbness weakness or ataxia.      Past Medical History:  Hypothyroidism  Hyperlipidemia  BPH  ?Asthma  - PFT's wnl 07/2005 > no better on symbicort so d/c March 06, 2010  - HFA 50% p coaching December 30, 2009 > 50% February 10, 2010 > 75% February 20, 2010  - Allergy profile neg 02/20/10  Coronary Heart Disease  Interstitial lung dz  - onset ? 2007 with CT Chest 08/10/05 c/w PF esp RUL > LUL  - CT Chest suggestive of asbestosis 09/09/09 non contrasted  - Daily prednisone rx since 12/09/2010                   Objective:   Physical Exam   wt 177 February 10, 2010 >  > 184 06/26/2010 >  > 12/29/2010  180 > 04/13/2011  182 pleasant amb wm nad, mild hoarseness  HEENT: nl dentition, turbinates, and orophanx. Nl external ear canals without cough reflex  NECK : without JVD/Nodes/TM/ nl carotid upstrokes bilaterally  LUNGS: no acc muscle use, trace insp crackles bilaterally  CV: RRR no s3 or murmur or increase in P2, no edema  ABD: soft and nontender with nl excursion in the supine position. No bruits or organomegaly, bowel sounds nl  MS: warm without deformities, calf tenderness, cyanosis  POS mild clubbing       Assessment & Plan:

## 2011-05-04 ENCOUNTER — Telehealth: Payer: Self-pay | Admitting: *Deleted

## 2011-05-04 NOTE — Telephone Encounter (Signed)
I [id number] to schedule f/u appt in [place] for  March 5th. Carron Curie, CMA

## 2011-05-13 NOTE — Telephone Encounter (Signed)
Pt has appt on 06-01-11. Carron Curie, CMA

## 2011-05-17 ENCOUNTER — Other Ambulatory Visit: Payer: Self-pay | Admitting: Anesthesiology

## 2011-05-17 DIAGNOSIS — M545 Low back pain: Secondary | ICD-10-CM

## 2011-05-18 ENCOUNTER — Ambulatory Visit
Admission: RE | Admit: 2011-05-18 | Discharge: 2011-05-18 | Disposition: A | Discharge status: Home / Self Care | Payer: Medicare Other | Source: Ambulatory Visit | Attending: Anesthesiology | Admitting: Anesthesiology

## 2011-05-18 DIAGNOSIS — M545 Low back pain: Secondary | ICD-10-CM

## 2011-06-01 ENCOUNTER — Ambulatory Visit (INDEPENDENT_AMBULATORY_CARE_PROVIDER_SITE_OTHER): Payer: Medicare Other | Admitting: Internal Medicine

## 2011-06-01 ENCOUNTER — Encounter: Payer: Self-pay | Admitting: Internal Medicine

## 2011-06-01 VITALS — BP 120/80 | HR 84 | Temp 97.8°F | Ht 66.0 in | Wt 182.0 lb

## 2011-06-01 DIAGNOSIS — J8410 Pulmonary fibrosis, unspecified: Secondary | ICD-10-CM

## 2011-06-01 MED ORDER — PREDNISONE 20 MG PO TABS: 20.0000 mg | ORAL_TABLET | Freq: Every day | ORAL | Status: AC

## 2011-06-01 NOTE — Progress Notes (Signed)
Subjective:    Patient ID: Raymond [name], male    DOB: [birth year]    MRN: [id number]  HPI   Primary Provider/Referring Provider: [name]     History of Present Illness:  34 yowm quit smoking in 1982 with improvement but never complete resolution in cough since then referred by Dr Christell Constant for doe starting in the summer of 2011 and PF on cxr/ct.   December 30, 2009 1st pulmonary office eval cc doe x rapid walk or uphills but does ok flat indolent onset but not progressive since around June 2011, cough is present x years, worse with colds, no better with symbicort, better breathing and coughing after symbicort and prednisone together. hfa poor, rec work on technique and gerd diet/ ppi two times a day.   February 10, 2010 ov sob no better but has learned to pace himself. following diet and using ppi/ symbicort but still struggling with hfa. rec work on hfa, come to [employer] office for pft   February 20, 2010 ov cc worsening doe, still struggling with hfa. minimal cough. ESR 73 rec short course of prednisone helped, never took tramadol.  March 05, 2010 ov some better esp cough with delsym and prednisone, did not take tramadol as rec but didn't really need it. Delsym if needed for cough  start prednisone 10 mg 2 daily with bfast until improve then 1 daily  Symbicort  stop   April 03, 2010 ov cough and breathing both better to his satisfaction on the 20 p reduced it to 10 mg per day and no change no following diet, chewing mint gum and using oil based vit d still. rec diet, taper pred as tol to floor of 5 mg daily  06/26/2010 ov cc sob and cough ok on Prednisone to 5 mg daily but since taper has more clear nasal drainage and throat clearing.  >>pred 10mg    09/24/10 Follow up  Pt returns for 3 month follow up pulmonary fibrosis. Since last ov he feel dyspnea is unchanged. Main compliant is post nasal drainage, throat clearing-mostly during daytime. Has minimal cough w/ clear mucus at times w/ no  flare. Continue on prednisone 10mg  . No significant change with increased steroid dose.  Took zyrtec without much help.  No discolored mucus or fever  Take Zyrtec10mg  daily  Add Chlor tabs 4mg  2 At bedtime   Avoid throat clearing , NO MINTS, use sugarless candy, ice chips to sooth throat.  Decrease Prednisone 5mg  daily  Now, then August 1st take 5mg   Every other day x 1 month and then stop      12/07/2010 f/u ov/Raymond [name] off prednisone since Sept 1  cc sob with more than slow walking but not convinced he's worse off prednisone at this point. no change cough, sense of pnds on or off prednisone nor assoc arthrtiris  rec Please see patient coordinator before you leave today  to schedule Sinus CT> min thickening only  If condition worsens start prednisone at 40 mg daily until better then 20 mg daily thereafter.     12/29/2010 f/u ov/Raymond [name] cc much better p restarting prednisone 9/13 @ 40 mg per day. Tapered to 20 mg per day. Cough also much better.  Concerned about wt gain but he's been much more active since pred rx and no sign wt gain yet.  rec Rec you check vit D level and let Dr Christell Constant check your bone density. The goal with prednisone use it to find the lowest effective dose so  don't taper it below 10 mg per day and slow your taper as follows If ok after 2 weeks, try Prednisone 20mg  on even days and 10 on odd  If after 2 weeks try Prednisone 10 mg daily as your new floor  If worsen go back to the previous dose that worked.   04/13/2011 f/u ov/Raymond [name]  Pt is currently taking 20mg  prednisone a day. He states when he tried to decrease the dose (within  Few days) he had increased SOB and cough. Pt staes [name] checked his vitamin d level and it was normal.   Cc doe x get in a hurry, if takes time can walk anywhere he wants, ok mailbox and back if goes slow rec Start Advair 250  One puff twice a day, smooth deep breath then rinse and gargle  The goal with prednisone use it to find the lowest  effective dose so don't taper it below 10 mg per day and slow your taper as follows   try Prednisone 20mg  on even days and 10 on odd  If after 2 weeks try Prednisone 10 mg daily as your new floor   06/01/2011 f/u ov/Raymond [name] cc worse x 2 weeks with yellow mucus rx with 40 mg pred and levaquin (x 7 days planned) and nebulizer while on advair.  Getting back to baseline activities but still not there due to doe. No more purulent sputum. No resting sob.   Sleeping ok without nocturnal  or early am exacerbation  of respiratory  c/o's  Also denies any obvious fluctuation of symptoms with weather or environmental changes or other aggravating or alleviating factors except as outlined above.   ROS  At present neg for  any significant sore throat, dysphagia, itching, sneezing,  nasal congestion or excess/ purulent secretions,  fever, chills, sweats, unintended wt loss, pleuritic or exertional cp, hempoptysis, orthopnea pnd or leg swelling.  Also denies presyncope, palpitations, heartburn, abdominal pain, nausea, vomiting, diarrhea  or change in bowel or urinary habits, dysuria,hematuria,  rash, arthralgias, visual complaints, headache, numbness weakness or ataxia.      Past Medical History:  Hypothyroidism  Hyperlipidemia  BPH  ?Asthma  - PFT's wnl 07/2005 > no better on symbicort so d/c March 06, 2010  - HFA 50% p coaching December 30, 2009 > 50% February 10, 2010 > 75% February 20, 2010  - Allergy profile neg 02/20/10  Coronary Heart Disease  Interstitial lung dz  - onset ? 2007 with CT Chest 08/10/05 c/w PF esp RUL > LUL  - CT Chest suggestive of asbestosis 09/09/09 non contrasted  - Daily prednisone rx since 12/09/2010                   Objective:   Physical Exam   wt 177 February 10, 2010 >  > 184 06/26/2010 >  > 12/29/2010  180 > 04/13/2011  182 > 182 06/01/2011  pleasant amb wm nad, mild hoarseness  HEENT: nl dentition, turbinates, and orophanx. Nl external ear canals without cough  reflex  NECK : without JVD/Nodes/TM/ nl carotid upstrokes bilaterally  LUNGS: no acc muscle use, trace insp crackles bilaterally  CV: RRR no s3 or murmur or increase in P2, no edema  ABD: soft and nontender with nl excursion in the supine position. No bruits or organomegaly, bowel sounds nl  MS: warm without deformities, calf tenderness, cyanosis  POS mild clubbing    cxr 05/28/11 reviewed > no def pna but hard to tell given background pf  which does not appear that different from baseline   Assessment & Plan:

## 2011-06-01 NOTE — Progress Notes (Signed)
Addended by: Michel Bickers A on: 06/01/2011 12:54 PM   Modules accepted: Orders

## 2011-06-01 NOTE — Progress Notes (Deleted)
Subjective:    Patient ID: Raymond [name], male    DOB: 1929-12-31    MRN: [id number]  HPI   Primary Provider/Referring Provider: [name]     History of Present Illness:  77 yowm quit smoking in 1982 with improvement but never complete resolution in cough since then referred by Dr Christell Constant for doe starting in the summer of 2011 and PF on cxr/ct.   December 30, 2009 1st pulmonary office eval cc doe x rapid walk or uphills but does ok flat indolent onset but not progressive since around June 2011, cough is present x years, worse with colds, no better with symbicort, better breathing and coughing after symbicort and prednisone together. hfa poor, rec work on technique and gerd diet/ ppi two times a day.   February 10, 2010 ov sob no better but has learned to pace himself. following diet and using ppi/ symbicort but still struggling with hfa. rec work on hfa, come to [employer] office for pft   February 20, 2010 ov cc worsening doe, still struggling with hfa. minimal cough. ESR 73 rec short course of prednisone helped, never took tramadol.  March 05, 2010 ov some better esp cough with delsym and prednisone, did not take tramadol as rec but didn't really need it. Delsym if needed for cough  start prednisone 10 mg 2 daily with bfast until improve then 1 daily  Symbicort  stop   April 03, 2010 ov cough and breathing both better to his satisfaction on the 20 p reduced it to 10 mg per day and no change no following diet, chewing mint gum and using oil based vit d still. rec diet, taper pred as tol to floor of 5 mg daily  06/26/2010 ov cc sob and cough ok on Prednisone to 5 mg daily but since taper has more clear nasal drainage and throat clearing.  >>pred 10mg    09/24/10 Follow up  Pt returns for 3 month follow up pulmonary fibrosis. Since last ov he feel dyspnea is unchanged. Main compliant is post nasal drainage, throat clearing-mostly during daytime. Has minimal cough w/ clear mucus at times w/ no  flare. Continue on prednisone 10mg  . No significant change with increased steroid dose.  Took zyrtec without much help.  No discolored mucus or fever  Take Zyrtec10mg  daily  Add Chlor tabs 4mg  2 At bedtime   Avoid throat clearing , NO MINTS, use sugarless candy, ice chips to sooth throat.  Decrease Prednisone 5mg  daily  Now, then August 1st take 5mg   Every other day x 1 month and then stop      12/07/2010 f/u ov/Oriyah Lamphear off prednisone since Sept 1  cc sob with more than slow walking but not convinced he's worse off prednisone at this point. no change cough, sense of pnds on or off prednisone nor assoc arthrtiris  rec Please see patient coordinator before you leave today  to schedule Sinus CT> min thickening only  If condition worsens start prednisone at 40 mg daily until better then 20 mg daily thereafter.     12/29/2010 f/u ov/Devota Viruet cc much better p restarting prednisone 9/13 @ 40 mg per day. Tapered to 20 mg per day. Cough also much better.  Concerned about wt gain but he's been much more active since pred rx and no sign wt gain yet.  rec Rec you check vit D level and let Dr Christell Constant check your bone density. The goal with prednisone use it to find the lowest effective dose so  don't taper it below 10 mg per day and slow your taper as follows If ok after 2 weeks, try Prednisone 20mg  on even days and 10 on odd  If after 2 weeks try Prednisone 10 mg daily as your new floor  If worsen go back to the previous dose that worked.   06/01/2011 f/u ov/Avaleigh Decuir  Pt is currently taking 20mg  prednisone a day. He states when he tried to decrease the dose (within  Few days) he had increased SOB and cough. Pt staes [name] checked his vitamin d level and it was normal.   Cc doe x get in a hurry, if takes time can walk anywhere he wants, ok mailbox and back if goes slow   Sleeping ok without nocturnal  or early am exacerbation  of respiratory  c/o's  Also denies any obvious fluctuation of symptoms with weather  or environmental changes or other aggravating or alleviating factors except as outlined above   ROS  At present neg for  any significant sore throat, dysphagia, itching, sneezing,  nasal congestion or excess/ purulent secretions,  fever, chills, sweats, unintended wt loss, pleuritic or exertional cp, hempoptysis, orthopnea pnd or leg swelling.  Also denies presyncope, palpitations, heartburn, abdominal pain, nausea, vomiting, diarrhea  or change in bowel or urinary habits, dysuria,hematuria,  rash, arthralgias, visual complaints, headache, numbness weakness or ataxia.      Past Medical History:  Hypothyroidism  Hyperlipidemia  BPH  ?Asthma  - PFT's wnl 07/2005 > no better on symbicort so d/c March 06, 2010  - HFA 50% p coaching December 30, 2009 > 50% February 10, 2010 > 75% February 20, 2010  - Allergy profile neg 02/20/10  Coronary Heart Disease  Interstitial lung dz  - onset ? 2007 with CT Chest 08/10/05 c/w PF esp RUL > LUL  - CT Chest suggestive of asbestosis 09/09/09 non contrasted  - Daily prednisone rx since 12/09/2010                   Objective:   Physical Exam   wt 177 February 10, 2010 >  > 184 06/26/2010 >  > 12/29/2010  180 > 06/01/2011  182 pleasant amb wm nad, mild hoarseness  HEENT: nl dentition, turbinates, and orophanx. Nl external ear canals without cough reflex  NECK : without JVD/Nodes/TM/ nl carotid upstrokes bilaterally  LUNGS: no acc muscle use, trace insp crackles bilaterally  CV: RRR no s3 or murmur or increase in P2, no edema  ABD: soft and nontender with nl excursion in the supine position. No bruits or organomegaly, bowel sounds nl  MS: warm without deformities, calf tenderness, cyanosis  POS mild clubbing       Assessment & Plan:

## 2011-06-01 NOTE — Assessment & Plan Note (Addendum)
-   onset ? 2007 with CT Chest 08/10/05 c/w PF esp RUL > LUL  - CT Chest suggestive of asbestosis 09/09/09 non contrasted  - Desat p 3 laps at [facility] office December 30, 2009  - PFTs February 20, 2010 FEV1 1.66 (67%) ratio 80, no better p B2, DLC0 55 > corrects to 123  - ESR 73 February 20, 2010 > Pred x 6 days only> better so started daily prednisone March 06, 2010  - Desats with fast walking 06/26/2010 on pred 5 mg per day with ESR 41  so increased to 10 mg daily > d/c 11/28/10 - 12/07/2010  Walked RA  2 laps @ 185 ft each stopped due to  desat to 85%,  ESR 58 > pred restarted 12/09/10 -ONO RA ordered 06/01/2011 >>>  Flare of PF vs bronchopneumonia (favor latter ) > improved on present rx  The goal with a chronic steroid dependent illness is always arriving at the lowest effective dose that controls the disease/symptoms and not accepting a set "formula" which is based on statistics or guidelines that don't always take into account patient  variability or the natural hx of the dz in every individual patient, which may well vary over time.  For now therefore I recommend the patient maintain  Floor of 20 mg per day and return to office in [place] to consider amb 02 and do ono ra in interim  Advair not helping lower pred rx and now has neb alb so d/c advair  Use of PPI is associated with improved survival time and with decreased radiologic fibrosis per King's study published in AJRCCM vol 184 p1390.  Dec 2011  This may not be cause and effect, but given how universally unhelpful all the otherstudy drugs have been for pf,   rec continue bid  Ac  ppi / diet/ lifestyle modification.

## 2011-06-01 NOTE — Patient Instructions (Addendum)
Prednisone 40 mg per day until you are back to your usual self, then 40 alternating with 20 mg for a week then stay on 20mg  per day.  Stop advair and just use the albuterol nebulizer as needed  I called in the prednisone 20 mg tablets to your pharmacy  Please schedule a follow up office visit in 6 weeks, call sooner if needed in [place] with walking saturations.

## 2011-06-01 NOTE — Progress Notes (Deleted)
Subjective:    Patient ID: Raymond [name], male    DOB: 10/12/1929    MRN: [id number]  HPI   Primary Provider/Referring Provider: Dr. Moore     History of Present Illness:  81 yowm quit smoking in 1982 with improvement but never complete resolution in cough since then referred by Dr Moore for doe starting in the summer of 2011 and PF on cxr/ct.   December 30, 2009 1st pulmonary office eval cc doe x rapid walk or uphills but does ok flat indolent onset but not progressive since around June 2011, cough is present x years, worse with colds, no better with symbicort, better breathing and coughing after symbicort and prednisone together. hfa poor, rec work on technique and gerd diet/ ppi two times a day.   February 10, 2010 ov sob no better but has learned to pace himself. following diet and using ppi/ symbicort but still struggling with hfa. rec work on hfa, come to [place] office for pft   February 20, 2010 ov cc worsening doe, still struggling with hfa. minimal cough. ESR 73 rec short course of prednisone helped, never took tramadol.  March 05, 2010 ov some better esp cough with delsym and prednisone, did not take tramadol as rec but didn't really need it. Delsym if needed for cough  start prednisone 10 mg 2 daily with bfast until improve then 1 daily  Symbicort  stop   April 03, 2010 ov cough and breathing both better to his satisfaction on the 20 p reduced it to 10 mg per day and no change no following diet, chewing mint gum and using oil based vit d still. rec diet, taper pred as tol to floor of 5 mg daily  06/26/2010 ov cc sob and cough ok on Prednisone to 5 mg daily but since taper has more clear nasal drainage and throat clearing.  >>pred 10mg   09/24/10 Follow up  Pt returns for 3 month follow up pulmonary fibrosis. Since last ov he feel dyspnea is unchanged. Main compliant is post nasal drainage, throat clearing-mostly during daytime. Has minimal cough w/ clear mucus at times w/ no  flare. Continue on prednisone 10mg . No significant change with increased steroid dose.  Took zyrtec without much help.  No discolored mucus or fever  Take Zyrtec10mg daily  Add Chlor tabs 4mg 2 At bedtime   Avoid throat clearing , NO MINTS, use sugarless candy, ice chips to sooth throat.  Decrease Prednisone 5mg daily  Now, then August 1st take 5mg  Every other day x 1 month and then stop      12/07/2010 f/u ov/Raymond [name] off prednisone since Sept 1  cc sob with more than slow walking but not convinced he's worse off prednisone at this point. no change cough, sense of pnds on or off prednisone nor assoc arthrtiris  rec Please see patient coordinator before you leave today  to schedule Sinus CT> min thickening only  If condition worsens start prednisone at 40 mg daily until better then 20 mg daily thereafter.     12/29/2010 f/u ov/Raymond [name] cc much better p restarting prednisone 9/13 @ 40 mg per day. Tapered to 20 mg per day. Cough also much better.  Concerned about wt gain but he's been much more active since pred rx and no sign wt gain yet.  rec Rec you check vit D level and let Dr Moore check your bone density. The goal with prednisone use it to find the lowest effective dose so   don't taper it below 10 mg per day and slow your taper as follows If ok after 2 weeks, try Prednisone 20mg on even days and 10 on odd  If after 2 weeks try Prednisone 10 mg daily as your new floor  If worsen go back to the previous dose that worked.   06/01/2011 f/u ov/Raymond [name]  Pt is currently taking 20mg prednisone a day. He states when he tried to decrease the dose (within  Few days) he had increased SOB and cough. Pt staes Dr. Moore checked his vitamin d level and it was normal.   Cc doe x get in a hurry, if takes time can walk anywhere he wants, ok mailbox and back if goes slow   Sleeping ok without nocturnal  or early am exacerbation  of respiratory  c/o's  Also denies any obvious fluctuation of symptoms with weather  or environmental changes or other aggravating or alleviating factors except as outlined above   ROS  At present neg for  any significant sore throat, dysphagia, itching, sneezing,  nasal congestion or excess/ purulent secretions,  fever, chills, sweats, unintended wt loss, pleuritic or exertional cp, hempoptysis, orthopnea pnd or leg swelling.  Also denies presyncope, palpitations, heartburn, abdominal pain, nausea, vomiting, diarrhea  or change in bowel or urinary habits, dysuria,hematuria,  rash, arthralgias, visual complaints, headache, numbness weakness or ataxia.      Past Medical History:  Hypothyroidism  Hyperlipidemia  BPH  ?Asthma  - PFT's wnl 07/2005 > no better on symbicort so d/c March 06, 2010  - HFA 50% p coaching December 30, 2009 > 50% February 10, 2010 > 75% February 20, 2010  - Allergy profile neg 02/20/10  Coronary Heart Disease  Interstitial lung dz  - onset ? 2007 with CT Chest 08/10/05 c/w PF esp RUL > LUL  - CT Chest suggestive of asbestosis 09/09/09 non contrasted  - Daily prednisone rx since 12/09/2010                   Objective:   Physical Exam   wt 177 February 10, 2010 >  > 184 06/26/2010 >  > 12/29/2010  180 > 06/01/2011  182 pleasant amb wm nad, mild hoarseness  HEENT: nl dentition, turbinates, and orophanx. Nl external ear canals without cough reflex  NECK : without JVD/Nodes/TM/ nl carotid upstrokes bilaterally  LUNGS: no acc muscle use, trace insp crackles bilaterally  CV: RRR no s3 or murmur or increase in P2, no edema  ABD: soft and nontender with nl excursion in the supine position. No bruits or organomegaly, bowel sounds nl  MS: warm without deformities, calf tenderness, cyanosis  POS mild clubbing       Assessment & Plan:   

## 2011-07-13 ENCOUNTER — Ambulatory Visit (INDEPENDENT_AMBULATORY_CARE_PROVIDER_SITE_OTHER)
Admission: RE | Admit: 2011-07-13 | Discharge: 2011-07-13 | Disposition: A | Discharge status: Home / Self Care | Payer: Medicare Other | Source: Ambulatory Visit | Attending: Internal Medicine | Admitting: Internal Medicine

## 2011-07-13 ENCOUNTER — Ambulatory Visit (INDEPENDENT_AMBULATORY_CARE_PROVIDER_SITE_OTHER): Payer: Medicare Other | Admitting: Internal Medicine

## 2011-07-13 ENCOUNTER — Other Ambulatory Visit (INDEPENDENT_AMBULATORY_CARE_PROVIDER_SITE_OTHER): Payer: Medicare Other

## 2011-07-13 ENCOUNTER — Encounter: Payer: Self-pay | Admitting: Internal Medicine

## 2011-07-13 VITALS — BP 98/58 | HR 111 | Temp 98.2°F | Ht 66.0 in | Wt 177.8 lb

## 2011-07-13 DIAGNOSIS — R059 Cough, unspecified: Secondary | ICD-10-CM

## 2011-07-13 DIAGNOSIS — J8410 Pulmonary fibrosis, unspecified: Secondary | ICD-10-CM

## 2011-07-13 DIAGNOSIS — R05 Cough: Secondary | ICD-10-CM

## 2011-07-13 LAB — CBC WITH DIFFERENTIAL/PLATELET
Basophils Relative: 0.2 % (ref 0.0–3.0)
Eosinophils Absolute: 0.1 10*3/uL (ref 0.0–0.7)
Eosinophils Relative: 0.5 % (ref 0.0–5.0)
Lymphocytes Relative: 19.3 % (ref 12.0–46.0)
Neutrophils Relative %: 75.2 % (ref 43.0–77.0)
RBC: 4.63 Mil/uL (ref 4.22–5.81)
WBC: 15.6 10*3/uL — ABNORMAL HIGH (ref 4.5–10.5)

## 2011-07-13 NOTE — Assessment & Plan Note (Signed)
-   onset ? 2007 with CT Chest 08/10/05 c/w PF esp RUL > LUL  - CT Chest suggestive of asbestosis 09/09/09 non contrasted  - Desat p 3 laps at [facility] office December 30, 2009  - PFTs February 20, 2010 FEV1 1.66 (67%) ratio 80, no better p B2, DLC0 55 > corrects to 123  - ESR 73 February 20, 2010 > Pred x 6 days only> better so started daily prednisone March 06, 2010  - Desats with fast walking 06/26/2010 on pred 5 mg per day with ESR 41  so increased to 10 mg daily > d/c 11/28/10 - 12/07/2010  Walked RA  2 laps @ 185 ft each stopped due to  desat to 85%,  ESR 58 > pred restarted 12/09/10 -07/13/2011  Walked RA x 1 laps @ 185 ft each stopped due to  desat to 87%, ESR 22> no change pred  Whatever he has does appear to be prednisone responsive, at least partially  The goal with a chronic steroid dependent illness is always arriving at the lowest effective dose that controls the disease/symptoms and not accepting a set "formula" which is based on statistics or guidelines that don't always take into account patient  variability or the natural hx of the dz in every individual patient, which may well vary over time.  For now therefore I recommend the patient maintain his present floor though not clear if this "one tablet" is 20 vs 10 mg daily.  See instructions for specific recommendations which were reviewed directly with the patient who was given a copy with highlighter outlining the key components.

## 2011-07-13 NOTE — Assessment & Plan Note (Signed)
Classic Upper airway cough syndrome, so named because it's frequently impossible to sort out how much is  CR/sinusitis with freq throat clearing (which can be related to primary GERD)   vs  causing  secondary (" extra esophageal")  GERD from wide swings in gastric pressure that occur with throat clearing, often  promoting self use of mint and menthol lozenges that reduce the lower esophageal sphincter tone and exacerbate the problem further in a cyclical fashion.   These are the same pts (now being labeled as having "irritable larynx syndrome" by some cough centers) who not infrequently have a history of having failed to tolerate ace inhibitors,  dry powder inhalers or biphosphonates or report having atypical reflux symptoms that don't respond to standard doses of PPI , and are easily confused as having aecopd or asthma flares by even experienced allergists/ pulmonologists.   He is chewing mint gum and still using dpi's so rec  Reviewed diet Added dulera 100 The proper method of use, as well as anticipated side effects, of a metered-dose inhaler are discussed and demonstrated to the patient. Improved effectiveness after extensive coaching during this visit to a level of approximately  75%

## 2011-07-13 NOTE — Progress Notes (Signed)
Quick Note:  Spoke with pt and notified of results per Dr. Wert. Pt verbalized understanding and denied any questions.  ______ 

## 2011-07-13 NOTE — Progress Notes (Signed)
Subjective:    Patient ID: Raymond [name], male    DOB: 09-14-1929    MRN: [id number]  HPI   Primary Provider/Referring Provider: [name]     History of Present Illness:  70 yowm quit smoking in 1982 with improvement but never complete resolution in cough since then referred by Dr Christell Constant for doe starting in the summer of 2011 and PF on cxr/ct.   December 30, 2009 1st pulmonary office eval cc doe x rapid walk or uphills but does ok flat indolent onset but not progressive since around June 2011, cough is present x years, worse with colds, no better with symbicort, better breathing and coughing after symbicort and prednisone together. hfa poor, rec work on technique and gerd diet/ ppi two times a day.   February 10, 2010 ov sob no better but has learned to pace himself. following diet and using ppi/ symbicort but still struggling with hfa. rec work on hfa, come to [employer] office for pft   February 20, 2010 ov cc worsening doe, still struggling with hfa. minimal cough. ESR 73 rec short course of prednisone helped, never took tramadol.  March 05, 2010 ov some better esp cough with delsym and prednisone, did not take tramadol as rec but didn't really need it. Delsym if needed for cough  start prednisone 10 mg 2 daily with bfast until improve then 1 daily  Symbicort  stop   April 03, 2010 ov cough and breathing both better to his satisfaction on the 20 p reduced it to 10 mg per day and no change no following diet, chewing mint gum and using oil based vit d still. rec diet, taper pred as tol to floor of 5 mg daily  06/26/2010 ov cc sob and cough ok on Prednisone to 5 mg daily but since taper has more clear nasal drainage and throat clearing.  >>pred 10mg    09/24/10 Follow up  Pt returns for 3 month follow up pulmonary fibrosis. Since last ov he feel dyspnea is unchanged. Main compliant is post nasal drainage, throat clearing-mostly during daytime. Has minimal cough w/ clear mucus at times w/ no  flare. Continue on prednisone 10mg  . No significant change with increased steroid dose.  Took zyrtec without much help.  No discolored mucus or fever  Take Zyrtec10mg  daily  Add Chlor tabs 4mg  2 At bedtime   Avoid throat clearing , NO MINTS, use sugarless candy, ice chips to sooth throat.  Decrease Prednisone 5mg  daily  Now, then August 1st take 5mg   Every other day x 1 month and then stop      12/07/2010 f/u ov/Page Pucciarelli off prednisone since Sept 1  cc sob with more than slow walking but not convinced he's worse off prednisone at this point. no change cough, sense of pnds on or off prednisone nor assoc arthrtiris  rec Please see patient coordinator before you leave today  to schedule Sinus CT> min thickening only  If condition worsens start prednisone at 40 mg daily until better then 20 mg daily thereafter.     12/29/2010 f/u ov/Rex Oesterle cc much better p restarting prednisone 9/13 @ 40 mg per day. Tapered to 20 mg per day. Cough also much better.  Concerned about wt gain but he's been much more active since pred rx and no sign wt gain yet.  rec Rec you check vit D level and let Dr Christell Constant check your bone density. The goal with prednisone use it to find the lowest effective dose so  don't taper it below 10 mg per day and slow your taper as follows If ok after 2 weeks, try Prednisone 20mg  on even days and 10 on odd  If after 2 weeks try Prednisone 10 mg daily as your new floor  If worsen go back to the previous dose that worked.   04/13/2011 f/u ov/Ewelina Naves  Pt is currently taking 20mg  prednisone a day. He states when he tried to decrease the dose (within  Few days) he had increased SOB and cough. Pt staes [name] checked his vitamin d level and it was normal.   Cc doe x get in a hurry, if takes time can walk anywhere he wants, ok mailbox and back if goes slow rec Start Advair 250  One puff twice a day, smooth deep breath then rinse and gargle  The goal with prednisone use it to find the lowest  effective dose so don't taper it below 10 mg per day and slow your taper as follows   try Prednisone 20mg  on even days and 10 on odd  If after 2 weeks try Prednisone 10 mg daily as your new floor   06/01/2011 f/u ov/Priti Consoli cc worse x 2 weeks with yellow mucus rx with 40 mg pred and levaquin (x 7 days planned) and nebulizer while on advair.  Getting back to baseline activities but still not there due to doe. No more purulent sputum. No resting sob. rec Prednisone 40 mg per day until you are back to your usual self, then 40 alternating with 20 mg for a week then stay on 20mg  per day. Stop advair and just use the albuterol nebulizer as needed I called in the prednisone 20 mg tablets to your pharmacy Please schedule a follow up office visit in 6 weeks, call sooner if needed in [place] with walking saturations   07/13/2011 f/u ov/Eshani Maestre still on prednisone one tablet ? Dose daily, no cough but breathing worse gradually x 5-6 weeks,  Did not follow advice to try off advair, chewing mint gum on arrival. No overt sinus or hb symptoms.   Sleeping ok without nocturnal  or early am exacerbation  of respiratory  c/o's  Also denies any obvious fluctuation of symptoms with weather or environmental changes or other aggravating or alleviating factors except as outlined above.   ROS  At present neg for  any significant sore throat, dysphagia, itching, sneezing,  nasal congestion or excess/ purulent secretions,  fever, chills, sweats, unintended wt loss, pleuritic or exertional cp, hempoptysis, orthopnea pnd or leg swelling.  Also denies presyncope, palpitations, heartburn, abdominal pain, nausea, vomiting, diarrhea  or change in bowel or urinary habits, dysuria,hematuria,  rash, arthralgias, visual complaints, headache, numbness weakness or ataxia.      Past Medical History:  Hypothyroidism  Hyperlipidemia  BPH  ?Asthma  - PFT's wnl 07/2005 > no better on symbicort so d/c March 06, 2010  - HFA 50% p  coaching December 30, 2009 > 50% February 10, 2010 > 75% February 20, 2010  - Allergy profile neg 02/20/10  Coronary Heart Disease  Interstitial lung dz  - onset ? 2007 with CT Chest 08/10/05 c/w PF esp RUL > LUL  - CT Chest suggestive of asbestosis 09/09/09 non contrasted  - Daily prednisone rx since 12/09/2010                   Objective:   Physical Exam   wt 177 February 10, 2010   > 12/29/2010  180 > 04/13/2011  182 > 182 06/01/2011 > 07/13/2011  177 pleasant amb wm nad, mild hoarseness  HEENT: nl dentition, turbinates, and orophanx. Nl external ear canals without cough reflex  NECK : without JVD/Nodes/TM/ nl carotid upstrokes bilaterally  LUNGS: no acc muscle use, trace insp crackles bilaterally  CV: RRR no s3 or murmur or increase in P2, no edema  ABD: soft and nontender with nl excursion in the supine position. No bruits or organomegaly, bowel sounds nl  MS: warm without deformities, calf tenderness, cyanosis  POS mild clubbing    CXR  07/13/2011 :  Chronic lung disease changes as above, most severe in the upper lobes right greater than left and at the right base. No definite interval change   Assessment & Plan:

## 2011-07-13 NOTE — Patient Instructions (Addendum)
Stop advair  If breathing is bad ok to use dulera 200 up 2 puffs every 12 hours  If not responding to dulera add the nebulizer up to 4 hours  GERD (REFLUX)  is an extremely common cause of respiratory symptoms, many times with no significant heartburn at all.    It can be treated with medication, but also with lifestyle changes including avoidance of late meals, excessive alcohol, smoking cessation, and avoid fatty foods, chocolate, peppermint, colas, red wine, and acidic juices such as orange juice.  NO MINT OR MENTHOL PRODUCTS SO NO COUGH DROPS  USE SUGARLESS CANDY INSTEAD (jolley ranchers or Stover's)  NO OIL BASED VITAMINS - use powdered substitutes.   Leave the prednisone dose unless bad flare of breathing or coughing, then double it  Please remember to go to the lab and x-ray department downstairs for your tests - we will call you with the results when they are available.     Follow up first Tuesday in May in [place]

## 2011-07-16 NOTE — Progress Notes (Signed)
Quick Note:  Spoke with pt and notified of results per Dr. Wert. Pt verbalized understanding and denied any questions.  ______ 

## 2011-08-03 ENCOUNTER — Encounter: Payer: Self-pay | Admitting: Internal Medicine

## 2011-08-03 ENCOUNTER — Ambulatory Visit (INDEPENDENT_AMBULATORY_CARE_PROVIDER_SITE_OTHER): Payer: Medicare Other | Admitting: Internal Medicine

## 2011-08-03 VITALS — BP 120/70 | HR 63 | Temp 97.6°F | Ht 66.0 in | Wt 182.0 lb

## 2011-08-03 DIAGNOSIS — J8410 Pulmonary fibrosis, unspecified: Secondary | ICD-10-CM

## 2011-08-03 DIAGNOSIS — R05 Cough: Secondary | ICD-10-CM

## 2011-08-03 MED ORDER — FAMOTIDINE 20 MG PO TABS: ORAL_TABLET | ORAL | Status: DC

## 2011-08-03 NOTE — Progress Notes (Signed)
Subjective:    Patient ID: Raymond [name], male    DOB: 26-Feb-1930    MRN: [id number]      Brief patient profile:  82 yowm quit smoking in 1982 with improvement but never complete resolution in cough since then referred by Dr Christell Constant for doe starting in the summer of 2011 and PF on cxr/ct.   December 30, 2009 1st pulmonary office eval cc doe x rapid walk or uphills but does ok flat indolent onset but not progressive since around June 2011, cough is present x years, worse with colds, no better with symbicort, better breathing and coughing after symbicort and prednisone together. hfa poor  rec work on technique and gerd diet/ ppi two times a day.   February 10, 2010 ov sob no better but has learned to pace himself. following diet and using ppi/ symbicort but still struggling with hfa.  rec work on hfa, come to [employer] office for pft   February 20, 2010 ov cc worsening doe, still struggling with hfa. minimal cough. ESR 73 rec short course of prednisone helped  March 05, 2010 ov some better esp cough with delsym and prednisone rec start prednisone 10 mg 2 daily with bfast until improve then 1 daily  Symbicort  stop   April 03, 2010 ov cough and breathing both better to his satisfaction on the 20 p reduced it to 10 mg per day and no change no following diet, chewing mint gum and using oil based vit d still.  rec diet, taper pred as tol to floor of 5 mg daily  06/26/2010 ov cc sob and cough ok on Prednisone to 5 mg daily but since taper has more clear nasal drainage and throat clearing.  >>pred 10mg    09/24/10 Follow up  Pt returns for 3 month follow up pulmonary fibrosis. Since last ov he feel dyspnea is unchanged. Main compliant is post nasal drainage, throat clearing-mostly during daytime. Has minimal cough w/ clear mucus at times w/ no flare. Continue on prednisone 10mg  . No significant change with increased steroid dose.  Took zyrtec without much help.  No discolored mucus or fever  Take  Zyrtec10mg  daily  Add Chlor tabs 4mg  2 At bedtime   Avoid throat clearing , NO MINTS, use sugarless candy, ice chips to sooth throat.  Decrease Prednisone 5mg  daily  Now, then August 1st take 5mg   Every other day x 1 month and then stop      12/07/2010 f/u ov/[place] Decoteau off prednisone since Sept 1  cc sob with more than slow walking but not convinced he's worse off prednisone at this point. no change cough, sense of pnds on or off prednisone nor assoc arthrtiris  rec  Sinus CT> min thickening only If condition worsens start prednisone at 40 mg daily until better then 20 mg daily thereafter.     12/29/2010 f/u ov/Teesha Ohm cc much better p restarting prednisone 9/13 @ 40 mg per day. Tapered to 20 mg per day. Cough also much better.  Concerned about wt gain but he's been much more active since pred rx and no sign wt gain yet.  rec Rec you check vit D level and let Dr Christell Constant check your bone density. The goal with prednisone use it to find the lowest effective dose so don't taper it below 10 mg per day and slow your taper as follows If ok after 2 weeks, try Prednisone 20mg  on even days and 10 on odd  If after 2 weeks try Prednisone  10 mg daily as your new floor  If worsen go back to the previous dose that worked.   04/13/2011 f/u ov/Garmon Dehn   @ 20mg  prednisone a day.cc  when he tried to decrease the dose (within  Few days) he had increased SOB and cough. Pt staes [name] checked his vitamin d level and it was normal.  Cc doe x get in a hurry, if takes time can walk anywhere he wants, ok mailbox and back if goes slow rec Start Advair 250  One puff twice a day, smooth deep breath then rinse and gargle  The goal with prednisone use it to find the lowest effective dose so don't taper it below 10 mg per day and slow your taper as follows   try Prednisone 20mg  on even days and 10 on odd  If after 2 weeks try Prednisone 10 mg daily as your new floor   06/01/2011 f/u ov/AmeLie Hollars cc worse x 2 weeks with yellow  mucus rx with 40 mg pred and levaquin (x 7 days planned) and nebulizer while on advair.  Getting back to baseline activities but still not there due to doe. No more purulent sputum. No resting sob. rec Prednisone 40 mg per day until you are back to your usual self, then 40 alternating with 20 mg for a week then stay on 20mg  per day. Stop advair and just use the albuterol nebulizer as needed I called in the prednisone 20 mg tablets to your pharmacy Please schedule a follow up office visit in 6 weeks, call sooner if needed in [place] with walking saturations   07/13/2011 f/u ov/Brigette Hopfer still on prednisone one tablet ? Dose daily, no cough but breathing worse gradually x 5-6 weeks,  Did not follow advice to try off advair, chewing mint gum on arrival. No overt sinus or hb symptoms. rec Stop advair If breathing is bad ok to use dulera 200 up 2 puffs every 12 hours If not responding to dulera add the nebulizer up to 4 hours GERD diet. Leave the prednisone dose unless bad flare of breathing or coughing, then double it Please remember to go to the lab and x-ray>  Esr 22   08/03/2011 f/u ov/Daurice Ovando on prednisone 40 mg per day for increase cough and sob cc sob more than room to room but really not helping - says neb helps the most but rarely uses it. Cough is dry, no overt HB or sinus complaints.  Main concern is limted by bilateral ankle pain with new dx of achilles rupture on R   Sleeping ok without nocturnal  or early am exacerbation  of respiratory  c/o's  Also denies any obvious fluctuation of symptoms with weather or environmental changes or other aggravating or alleviating factors except as outlined above.   ROS  At present neg for  any significant sore throat, dysphagia, itching, sneezing,  nasal congestion or excess/ purulent secretions,  fever, chills, sweats, unintended wt loss, pleuritic or exertional cp, hempoptysis, orthopnea pnd or leg swelling.  Also denies presyncope, palpitations,  heartburn, abdominal pain, nausea, vomiting, diarrhea  or change in bowel or urinary habits, dysuria,hematuria,  rash, arthralgias, visual complaints, headache, numbness weakness or ataxia.      Past Medical History:  Hypothyroidism  Hyperlipidemia  BPH  ?Asthma  - PFT's wnl 07/2005 > no better on symbicort so d/c March 06, 2010  - HFA 50% p coaching December 30, 2009 > 50% February 10, 2010 > 75% February 20, 2010  - Allergy  profile neg 02/20/10  Coronary Heart Disease  Interstitial lung dz  - onset ? 2007 with CT Chest 08/10/05 c/w PF esp RUL > LUL  - CT Chest suggestive of asbestosis 09/09/09 non contrasted  - Daily prednisone rx since 12/09/2010                   Objective:   Physical Exam   wt 177 February 10, 2010   > 12/29/2010  180 > 04/13/2011  182 >  07/13/2011  177> 08/03/2011  182 pleasant amb wm nad, mild hoarseness  HEENT: nl dentition, turbinates, and orophanx. Nl external ear canals without cough reflex  NECK : without JVD/Nodes/TM/ nl carotid upstrokes bilaterally  LUNGS: no acc muscle use, trace insp crackles bilaterally  CV: RRR no s3 or murmur or increase in P2, no edema  ABD: soft and nontender with nl excursion in the supine position. No bruits or organomegaly, bowel sounds nl  MS: warm without deformities, calf tenderness, cyanosis  POS mild clubbing    CXR  07/13/2011 :  Chronic lung disease changes as above, most severe in the upper lobes right greater than left and at the right base. No definite interval change   Assessment & Plan:

## 2011-08-03 NOTE — Assessment & Plan Note (Addendum)
-   onset ? 2007 with CT Chest 08/10/05 c/w PF esp RUL > LUL  - CT Chest suggestive of asbestosis 09/09/09 non contrasted  - Desat p 3 laps at [facility] office December 30, 2009  - PFTs February 20, 2010 FEV1 1.66 (67%) ratio 80, no better p B2, DLC0 55 > corrects to 123  - ESR 73 February 20, 2010 > Pred x 6 days only> better so started daily prednisone March 06, 2010  - Desats with fast walking 06/26/2010 on pred 5 mg per day with ESR 41  so increased to 10 mg daily > d/c 11/28/10 - 12/07/2010  Walked RA  2 laps @ 185 ft each stopped due to  desat to 85%,  ESR 58 > pred restarted 12/09/10 -07/13/2011  Walked RA x 1 laps @ 185 ft each stopped due to  desat to 87%, ESR 22> no change pred  Pt increased pred to 40 since last ov s much benefit as would be expected with a low baseline esr.  The goal with a chronic steroid dependent illness is always arriving at the lowest effective dose that controls the disease/symptoms and not accepting a set "formula" which is based on statistics or guidelines that don't always take into account patient  variability or the natural hx of the dz in every individual patient, which may well vary over time.  For now therefore I recommend the patient maintain  20 mg per day and increase neb if needed since he thinks this helps "more than anything" (although strongly doubt much of a reversible airway component)  Also Use of PPI is associated with improved survival time and with decreased radiologic fibrosis per King's study published in AJRCCM vol 184 p1390.  Dec 2011  This may not be cause and effect, but given how universally unhelpful all the otherstudy drugs have been for pf,   rec  Max ppi plus h2 at hs on a trial basis.

## 2011-08-03 NOTE — Patient Instructions (Addendum)
Work on Musician technique:  relax and gently blow all the way out then take a nice smooth deep breath back in, triggering the inhaler at same time you start breathing in.  Hold for up to 5 seconds if you can.  Rinse and gargle with water when done   If your mouth or throat starts to bother you,   I suggest you time the inhaler to your dental care and after using the inhaler(s) brush teeth and tongue with a baking soda containing toothpaste and when you rinse this out, gargle with it first to see if this helps your mouth and throat.  Add pepcid ac 20 mg one at time     If breathing worse, first try nebulizer up to every 4 hours   Reduce prednisone back to 20 mg per day and only increase to 40 mg per day as last resort.   Please schedule a follow up visit in 2  months but call sooner if needed

## 2011-08-03 NOTE — Assessment & Plan Note (Signed)
The proper method of use, as well as anticipated side effects, of a metered-dose inhaler are discussed and demonstrated to the patient. Improved effectiveness after extensive coaching during this visit to a level of approximately  75% but no better.  Will continue to recommend dulera 100 but add the neb prn flare rather than increase the prednisone to control cough.

## 2011-08-17 ENCOUNTER — Encounter (HOSPITAL_BASED_OUTPATIENT_CLINIC_OR_DEPARTMENT_OTHER): Payer: Self-pay | Admitting: *Deleted

## 2011-08-17 NOTE — Progress Notes (Signed)
To come in for [id number] Sees dr wert for copd-controlled

## 2011-08-19 ENCOUNTER — Encounter (HOSPITAL_BASED_OUTPATIENT_CLINIC_OR_DEPARTMENT_OTHER)
Admission: RE | Admit: 2011-08-19 | Discharge: 2011-08-19 | Disposition: A | Discharge status: Home / Self Care | Payer: Medicare Other | Source: Ambulatory Visit | Attending: Orthopedic Surgery | Admitting: Orthopedic Surgery

## 2011-08-19 LAB — BASIC METABOLIC PANEL
BUN: 20 mg/dL (ref 6–23)
Calcium: 9.7 mg/dL (ref 8.4–10.5)
GFR calc Af Amer: >90 mL/min (ref >90)
GFR calc non Af Amer: 85 mL/min — ABNORMAL LOW (ref >90)
Glucose, Bld: 98 mg/dL (ref 70–99)
Potassium: 4.1 mEq/L (ref 3.5–5.1)

## 2011-08-20 ENCOUNTER — Ambulatory Visit (HOSPITAL_BASED_OUTPATIENT_CLINIC_OR_DEPARTMENT_OTHER)
Admission: RE | Admit: 2011-08-20 | Discharge: 2011-08-20 | Disposition: A | Discharge status: Home / Self Care | Payer: Medicare Other | Source: Ambulatory Visit | Attending: Orthopedic Surgery | Admitting: Orthopedic Surgery

## 2011-08-20 ENCOUNTER — Encounter (HOSPITAL_BASED_OUTPATIENT_CLINIC_OR_DEPARTMENT_OTHER)
Admission: RE | Disposition: A | Discharge status: Home / Self Care | Payer: Self-pay | Source: Ambulatory Visit | Attending: Orthopedic Surgery

## 2011-08-20 ENCOUNTER — Ambulatory Visit (HOSPITAL_BASED_OUTPATIENT_CLINIC_OR_DEPARTMENT_OTHER): Payer: Medicare Other | Admitting: Anesthesiology

## 2011-08-20 ENCOUNTER — Encounter (HOSPITAL_BASED_OUTPATIENT_CLINIC_OR_DEPARTMENT_OTHER): Payer: Self-pay | Admitting: Anesthesiology

## 2011-08-20 ENCOUNTER — Encounter (HOSPITAL_BASED_OUTPATIENT_CLINIC_OR_DEPARTMENT_OTHER): Payer: Self-pay | Admitting: *Deleted

## 2011-08-20 DIAGNOSIS — M6240 Contracture of muscle, unspecified site: Secondary | ICD-10-CM | POA: Insufficient documentation

## 2011-08-20 DIAGNOSIS — Z01810 Encounter for preprocedural cardiovascular examination: Secondary | ICD-10-CM | POA: Insufficient documentation

## 2011-08-20 DIAGNOSIS — I2510 Atherosclerotic heart disease of native coronary artery without angina pectoris: Secondary | ICD-10-CM | POA: Insufficient documentation

## 2011-08-20 DIAGNOSIS — J4489 Other specified chronic obstructive pulmonary disease: Secondary | ICD-10-CM | POA: Insufficient documentation

## 2011-08-20 DIAGNOSIS — J449 Chronic obstructive pulmonary disease, unspecified: Secondary | ICD-10-CM | POA: Insufficient documentation

## 2011-08-20 DIAGNOSIS — E039 Hypothyroidism, unspecified: Secondary | ICD-10-CM | POA: Insufficient documentation

## 2011-08-20 DIAGNOSIS — K219 Gastro-esophageal reflux disease without esophagitis: Secondary | ICD-10-CM | POA: Insufficient documentation

## 2011-08-20 DIAGNOSIS — S86011A Strain of right Achilles tendon, initial encounter: Secondary | ICD-10-CM

## 2011-08-20 DIAGNOSIS — M66369 Spontaneous rupture of flexor tendons, unspecified lower leg: Secondary | ICD-10-CM | POA: Insufficient documentation

## 2011-08-20 HISTORY — DX: Unspecified osteoarthritis, unspecified site: M19.90

## 2011-08-20 HISTORY — PX: ACHILLES TENDON SURGERY: SHX542

## 2011-08-20 HISTORY — DX: Chronic obstructive pulmonary disease, unspecified: J44.9

## 2011-08-20 HISTORY — DX: Gastro-esophageal reflux disease without esophagitis: K21.9

## 2011-08-20 SURGERY — REPAIR, TENDON, ACHILLES
Anesthesia: General | Site: Leg Lower | Laterality: Right | Wound class: Clean

## 2011-08-20 MED ORDER — SODIUM CHLORIDE 0.9 % IV SOLN: INTRAVENOUS | Status: DC

## 2011-08-20 MED ORDER — DEXAMETHASONE SODIUM PHOSPHATE 4 MG/ML IJ SOLN
INTRAMUSCULAR | Status: DC | PRN
  Administered 2011-08-20: 4 mg via INTRAVENOUS

## 2011-08-20 MED ORDER — ONDANSETRON HCL 4 MG/2ML IJ SOLN
INTRAMUSCULAR | Status: DC | PRN
  Administered 2011-08-20: 4 mg via INTRAVENOUS

## 2011-08-20 MED ORDER — MIDAZOLAM HCL 2 MG/2ML IJ SOLN
1.0000 mg | INTRAMUSCULAR | Status: DC | PRN
  Administered 2011-08-20: 1 mg via INTRAVENOUS

## 2011-08-20 MED ORDER — SUCCINYLCHOLINE CHLORIDE 20 MG/ML IJ SOLN
INTRAMUSCULAR | Status: DC | PRN
  Administered 2011-08-20: 160 mg via INTRAVENOUS

## 2011-08-20 MED ORDER — ASPIRIN EC 325 MG PO TBEC: 325.0000 mg | DELAYED_RELEASE_TABLET | Freq: Two times a day (BID) | ORAL | Status: AC

## 2011-08-20 MED ORDER — BUPIVACAINE HCL (PF) 0.5 % IJ SOLN
INTRAMUSCULAR | Status: DC | PRN
  Administered 2011-08-20: 8 mL

## 2011-08-20 MED ORDER — FENTANYL CITRATE 0.05 MG/ML IJ SOLN
50.0000 ug | INTRAMUSCULAR | Status: DC | PRN
  Administered 2011-08-20: 50 ug via INTRAVENOUS

## 2011-08-20 MED ORDER — CHLORHEXIDINE GLUCONATE 4 % EX LIQD: 60.0000 mL | Freq: Once | CUTANEOUS | Status: DC

## 2011-08-20 MED ORDER — HYDROMORPHONE HCL PF 1 MG/ML IJ SOLN: 0.2500 mg | INTRAMUSCULAR | Status: DC | PRN

## 2011-08-20 MED ORDER — 0.9 % SODIUM CHLORIDE (POUR BTL) OPTIME
TOPICAL | Status: DC | PRN
  Administered 2011-08-20: 1000 mL

## 2011-08-20 MED ORDER — CEFAZOLIN SODIUM 1-5 GM-% IV SOLN: 1.0000 g | INTRAVENOUS | Status: DC

## 2011-08-20 MED ORDER — CEFAZOLIN SODIUM-DEXTROSE 2-3 GM-% IV SOLR
2.0000 g | INTRAVENOUS | Status: AC
  Administered 2011-08-20: 2 g via INTRAVENOUS

## 2011-08-20 MED ORDER — PROMETHAZINE HCL 25 MG/ML IJ SOLN: 6.2500 mg | INTRAMUSCULAR | Status: DC | PRN

## 2011-08-20 MED ORDER — HYDROMORPHONE HCL 2 MG PO TABS: 2.0000 mg | ORAL_TABLET | ORAL | Status: AC | PRN

## 2011-08-20 MED ORDER — METHOCARBAMOL 500 MG PO TABS: 500.0000 mg | ORAL_TABLET | Freq: Three times a day (TID) | ORAL | Status: AC

## 2011-08-20 MED ORDER — LACTATED RINGERS IV SOLN
INTRAVENOUS | Status: DC
  Administered 2011-08-20 (×2): via INTRAVENOUS

## 2011-08-20 MED ORDER — PHENYLEPHRINE HCL 10 MG/ML IJ SOLN
10.0000 mg | INTRAVENOUS | Status: DC | PRN
  Administered 2011-08-20: 50 ug/min via INTRAVENOUS

## 2011-08-20 MED ORDER — VITAMIN C 500 MG PO TABS: 500.0000 mg | ORAL_TABLET | Freq: Every day | ORAL | Status: DC

## 2011-08-20 MED ORDER — LIDOCAINE HCL (CARDIAC) 20 MG/ML IV SOLN
INTRAVENOUS | Status: DC | PRN
  Administered 2011-08-20 (×2): 30 mg via INTRAVENOUS

## 2011-08-20 MED ORDER — ALBUTEROL SULFATE (2.5 MG/3ML) 0.083% IN NEBU
INHALATION_SOLUTION | RESPIRATORY_TRACT | Status: DC | PRN
  Administered 2011-08-20: 2.5 mg via RESPIRATORY_TRACT

## 2011-08-20 MED ORDER — PROPOFOL 10 MG/ML IV EMUL
INTRAVENOUS | Status: DC | PRN
  Administered 2011-08-20: 60 mg via INTRAVENOUS
  Administered 2011-08-20: 140 mg via INTRAVENOUS

## 2011-08-20 MED ORDER — BUPIVACAINE-EPINEPHRINE PF 0.5-1:200000 % IJ SOLN
INTRAMUSCULAR | Status: DC | PRN
  Administered 2011-08-20: 30 mL

## 2011-08-20 MED ORDER — MIDAZOLAM HCL 2 MG/2ML IJ SOLN: 0.5000 mg | Freq: Once | INTRAMUSCULAR | Status: DC | PRN

## 2011-08-20 MED ORDER — FENTANYL CITRATE 0.05 MG/ML IJ SOLN
INTRAMUSCULAR | Status: DC | PRN
  Administered 2011-08-20 (×2): 50 ug via INTRAVENOUS

## 2011-08-20 MED ORDER — PHENYLEPHRINE HCL 10 MG/ML IJ SOLN: INTRAMUSCULAR | Status: DC | PRN

## 2011-08-20 MED ORDER — PHENYLEPHRINE HCL 10 MG/ML IJ SOLN
INTRAMUSCULAR | Status: DC | PRN
  Administered 2011-08-20 (×4): 50 ug via INTRAVENOUS

## 2011-08-20 MED ORDER — MEPERIDINE HCL 25 MG/ML IJ SOLN: 6.2500 mg | INTRAMUSCULAR | Status: DC | PRN

## 2011-08-20 SURGICAL SUPPLY — 76 items
APL SKNCLS STERI-STRIP NONHPOA (GAUZE/BANDAGES/DRESSINGS)
BANDAGE ELASTIC 4 VELCRO ST LF (GAUZE/BANDAGES/DRESSINGS) ×1 IMPLANT
BANDAGE ELASTIC 6 VELCRO ST LF (GAUZE/BANDAGES/DRESSINGS) ×1 IMPLANT
BENZOIN TINCTURE PRP APPL 2/3 (GAUZE/BANDAGES/DRESSINGS) IMPLANT
BLADE OSC/SAG .038X5.5 CUT EDG (BLADE) IMPLANT
BLADE SURG 11 STRL SS (BLADE) IMPLANT
BLADE SURG 15 STRL LF DISP TIS (BLADE) ×4 IMPLANT
BLADE SURG 15 STRL SS (BLADE) ×6
BRUSH SCRUB EZ PLAIN DRY (MISCELLANEOUS) ×2 IMPLANT
CANISTER SUCTION 1200CC (MISCELLANEOUS) ×2 IMPLANT
CLOTH BEACON ORANGE TIMEOUT ST (SAFETY) ×2 IMPLANT
COTTON STERILE ROLL (GAUZE/BANDAGES/DRESSINGS) ×1 IMPLANT
COVER ROLLED LF STERILE (DRAPES) ×1 IMPLANT
COVER TABLE BACK 60X90 (DRAPES) ×2 IMPLANT
CUFF TOURNIQUET SINGLE 34IN LL (TOURNIQUET CUFF) ×2 IMPLANT
DRAPE EXTREMITY T 121X128X90 (DRAPE) ×2 IMPLANT
DRAPE OEC MINIVIEW 54X84 (DRAPES) IMPLANT
DRAPE SURG 17X23 STRL (DRAPES) ×2 IMPLANT
DRSG PAD ABDOMINAL 8X10 ST (GAUZE/BANDAGES/DRESSINGS) ×2 IMPLANT
DURA STEPPER LG (CAST SUPPLIES) IMPLANT
DURA STEPPER MED (CAST SUPPLIES) IMPLANT
DURA STEPPER SML (CAST SUPPLIES) IMPLANT
ELECT REM PT RETURN 9FT ADLT (ELECTROSURGICAL) ×2
ELECTRODE REM PT RTRN 9FT ADLT (ELECTROSURGICAL) ×1 IMPLANT
GAUZE SPONGE 4X4 16PLY XRAY LF (GAUZE/BANDAGES/DRESSINGS) IMPLANT
GAUZE XEROFORM 1X8 LF (GAUZE/BANDAGES/DRESSINGS) ×1 IMPLANT
GAUZE XEROFORM 5X9 LF (GAUZE/BANDAGES/DRESSINGS) IMPLANT
GLOVE BIO SURGEON STRL SZ 6.5 (GLOVE) ×1 IMPLANT
GLOVE BIO SURGEON STRL SZ7 (GLOVE) ×1 IMPLANT
GLOVE BIO SURGEON STRL SZ8 (GLOVE) ×2 IMPLANT
GLOVE BIOGEL PI IND STRL 7.0 (GLOVE) IMPLANT
GLOVE BIOGEL PI IND STRL 8 (GLOVE) ×2 IMPLANT
GLOVE BIOGEL PI INDICATOR 7.0 (GLOVE) ×1
GLOVE BIOGEL PI INDICATOR 8 (GLOVE) ×2
GLOVE SURG SS PI 8.0 STRL IVOR (GLOVE) ×2 IMPLANT
GOWN BRE IMP PREV XXLGXLNG (GOWN DISPOSABLE) ×1 IMPLANT
GOWN PREVENTION PLUS XLARGE (GOWN DISPOSABLE) ×1 IMPLANT
NDL MAYO TROCAR (NEEDLE) IMPLANT
NDL SUT 6 .5 CRC .975X.05 MAYO (NEEDLE) IMPLANT
NEEDLE HYPO 22GX1.5 SAFETY (NEEDLE) IMPLANT
NEEDLE MAYO TAPER (NEEDLE)
NEEDLE MAYO TROCAR (NEEDLE) ×2 IMPLANT
PACK BASIN DAY SURGERY FS (CUSTOM PROCEDURE TRAY) ×2 IMPLANT
PAD CAST 4YDX4 CTTN HI CHSV (CAST SUPPLIES) ×1 IMPLANT
PADDING CAST ABS 4INX4YD NS (CAST SUPPLIES) ×1
PADDING CAST ABS COTTON 4X4 ST (CAST SUPPLIES) ×1 IMPLANT
PADDING CAST COTTON 4X4 STRL (CAST SUPPLIES) ×4
PASSER SUT SWANSON 36MM LOOP (INSTRUMENTS) ×1 IMPLANT
PENCIL BUTTON HOLSTER BLD 10FT (ELECTRODE) ×2 IMPLANT
SHEET MEDIUM DRAPE 40X70 STRL (DRAPES) ×4 IMPLANT
SLEEVE SCD COMPRESS KNEE MED (MISCELLANEOUS) IMPLANT
SPLINT FAST PLASTER 5X30 (CAST SUPPLIES) ×20
SPLINT PLASTER CAST FAST 5X30 (CAST SUPPLIES) IMPLANT
SPONGE GAUZE 4X4 12PLY (GAUZE/BANDAGES/DRESSINGS) ×2 IMPLANT
STOCKINETTE 6  STRL (DRAPES) ×1
STOCKINETTE 6 STRL (DRAPES) ×1 IMPLANT
STRIP CLOSURE SKIN 1/2X4 (GAUZE/BANDAGES/DRESSINGS) IMPLANT
SUCTION FRAZIER TIP 10 FR DISP (SUCTIONS) IMPLANT
SUT ETHILON 4 0 PS 2 18 (SUTURE) ×3 IMPLANT
SUT FIBERWIRE #2 38 T-5 BLUE (SUTURE) ×4
SUT FIBERWIRE #5 38 CONV NDL (SUTURE)
SUT FIBERWIRE 2-0 18 17.9 3/8 (SUTURE)
SUT MNCRL AB 4-0 PS2 18 (SUTURE) ×2 IMPLANT
SUT VIC AB 0 CT3 27 (SUTURE) IMPLANT
SUT VIC AB 2-0 PS2 27 (SUTURE) IMPLANT
SUT VIC AB 3-0 PS1 18 (SUTURE) ×6
SUT VIC AB 3-0 PS1 18XBRD (SUTURE) ×2 IMPLANT
SUTURE FIBERWR #2 38 T-5 BLUE (SUTURE) IMPLANT
SUTURE FIBERWR #5 38 CONV NDL (SUTURE) IMPLANT
SUTURE FIBERWR 2-0 18 17.9 3/8 (SUTURE) IMPLANT
SYR BULB 3OZ (MISCELLANEOUS) ×2 IMPLANT
SYR CONTROL 10ML LL (SYRINGE) IMPLANT
TOWEL OR NON WOVEN STRL DISP B (DISPOSABLE) ×2 IMPLANT
TUBE CONNECTING 20X1/4 (TUBING) ×2 IMPLANT
UNDERPAD 30X30 INCONTINENT (UNDERPADS AND DIAPERS) ×2 IMPLANT
YANKAUER SUCT BULB TIP NO VENT (SUCTIONS) ×1 IMPLANT

## 2011-08-20 NOTE — Anesthesia Postprocedure Evaluation (Signed)
  Anesthesia Post-op Note  Patient: Raymond [name]  Procedure(s) Performed: Procedure(s) (LRB): ACHILLES TENDON REPAIR (Right)  Patient Location: PACU  Anesthesia Type: GA combined with regional for post-op pain  Level of Consciousness: awake, alert  and oriented  Airway and Oxygen Therapy: Patient Spontanous Breathing  Post-op Pain: none  Post-op Assessment: Post-op Vital signs reviewed, Patient's Cardiovascular Status Stable, Respiratory Function Stable, Patent Airway, No signs of Nausea or vomiting and Pain level controlled  Post-op Vital Signs: Reviewed and stable  Complications: No apparent anesthesia complications

## 2011-08-20 NOTE — Transfer of Care (Signed)
Immediate Anesthesia Transfer of Care Note  Patient: Raymond [name]  Procedure(s) Performed: Procedure(s) (LRB): ACHILLES TENDON REPAIR (Right)  Patient Location: PACU  Anesthesia Type: GA combined with regional for post-op pain  Level of Consciousness: awake, alert  and patient cooperative  Airway & Oxygen Therapy: Patient Spontanous Breathing and Patient connected to face mask oxygen  Post-op Assessment: Report given to PACU RN and Post -op Vital signs reviewed and stable  Post vital signs: Reviewed and stable  Complications: No apparent anesthesia complications

## 2011-08-20 NOTE — Anesthesia Preprocedure Evaluation (Addendum)
Anesthesia Evaluation  Patient identified by MRN, date of birth, ID band Patient awake    Reviewed: Allergy & Precautions, H&P , NPO status , Patient's Chart, lab work & pertinent test results  History of Anesthesia Complications Negative for: history of anesthetic complications  Airway Mallampati: I TM Distance: >3 FB Neck ROM: Full    Dental No notable dental hx. (+) Teeth Intact and Dental Advisory Given   Pulmonary shortness of breath, COPD (pulm fibrosis dx '07, on steroids (took today)) COPD inhaler,  breath sounds clear to auscultation  Pulmonary exam normal       Cardiovascular - CAD Rhythm:Regular Rate:Normal  Stress test '09: no ischemia, EF 70%   Neuro/Psych negative neurological ROS     GI/Hepatic Neg liver ROS, GERD-  Medicated and Controlled,  Endo/Other  Hypothyroidism (on replacement)   Renal/GU negative Renal ROS     Musculoskeletal   Abdominal (+) + obese,   Peds  Hematology negative hematology ROS (+)   Anesthesia Other Findings   Reproductive/Obstetrics                          Anesthesia Physical Anesthesia Plan  ASA: III  Anesthesia Plan: General   Post-op Pain Management:    Induction: Intravenous  Airway Management Planned: LMA  Additional Equipment:   Intra-op Plan:   Post-operative Plan:   Informed Consent: I have reviewed the patients History and Physical, chart, labs and discussed the procedure including the risks, benefits and alternatives for the proposed anesthesia with the patient or authorized representative who has indicated his/her understanding and acceptance.   Dental advisory given  Plan Discussed with: Surgeon and CRNA  Anesthesia Plan Comments: (Plan routine monitors, GA- LMA OK, popliteal block for post op analgesia  )        Anesthesia Quick Evaluation

## 2011-08-20 NOTE — Discharge Instructions (Signed)
Non weight bearing right leg Achilles Tendon Repair  Care After AFTER THE PROCEDURE  You will be taken to the recovery area where a nurse will watch and check your progress. Once you are awake, stable, and taking fluids well, if there are no other problems, you will be allowed to go home.   If this was done as a same day surgery, make sure to have a responsible adult with you for the first 24 hours following your surgery.   Do not drink alcohol, drive a car, use public transportation, or sign important papers for at least one day after surgery.   Do not resume physical activities until directed by your caregiver and surgeon.   Apply ice to the operative site for 15 to 20 minutes, 3 to 4 times per day for the first 2-3 days, or as directed. Put the ice in a plastic bag and place a towel between the bag of ice and your skin, splint or cast.   Only take over-the-counter or prescription medicines for pain, discomfort, or fever as directed by your caregiver.   You may resume your normal diet as directed.   Change your dressings as directed.   Use crutches and move about only as instructed.   Keep leg raised above the level of the heart (the center of the chest) at all times when not using the bathroom, etc. Do not dangle the leg over a chair, couch or bed. When lying down, raise your leg on a couple of pillows. Keeping your leg up this way prevents swelling and reduces pain.   Avoid use of your leg other than gentle range of motion with your toes.  SEEK MEDICAL CARE IF:   There is redness, swelling, or increasing pain in the wound.   There is pus coming from the wound.   There is drainage from a wound lasting longer than one day.   An unexplained oral temperature above 102 F (38.9 C) develops.   You notice a bad smell coming from the wound or dressing.   The wound edges break apart after sutures or staples have been removed.   You develop continuing nausea or vomiting.  SEEK  IMMEDIATE MEDICAL CARE IF:   Your pain and swelling increase or pain is uncontrolled with medicine.   You develop new, unexplained symptoms.   You have trouble moving or cannot move your toes or foot, or develop warmth and swelling in your foot, or begin running an unexplained temperature.   You develop a rash.   You have a hard time breathing.   You develop or feel you are developing any reaction or side effects to the medicines given.   Regional Anesthesia Blocks  1. Numbness or the inability to move the "blocked" extremity may last from 3-48 hours after placement. The length of time depends on the medication injected and your individual response to the medication. If the numbness is not going away after 48 hours, call your surgeon.  2. The extremity that is blocked will need to be protected until the numbness is gone and the  Strength has returned. Because you cannot feel it, you will need to take extra care to avoid injury. Because it may be weak, you may have difficulty moving it or using it. You may not know what position it is in without looking at it while the block is in effect.  3. For blocks in the legs and feet, returning to weight bearing and walking needs to be  done carefully. You will need to wait until the numbness is entirely gone and the strength has returned. You should be able to move your leg and foot normally before you try and bear weight or walk. You will need someone to be with you when you first try to ensure you do not fall and possibly risk injury.  4. Bruising and tenderness at the needle site are common side effects and will resolve in a few days.  5. Persistent numbness or new problems with movement should be communicated to the surgeon or the [facility] [phone] [facility] [phone]). Regional Anesthesia Blocks  1. Numbness or the inability to move the "blocked" extremity may last from 3-48 hours after placement. The  length of time depends on the medication injected and your individual response to the medication. If the numbness is not going away after 48 hours, call your surgeon.  2. The extremity that is blocked will need to be protected until the numbness is gone and the  Strength has returned. Because you cannot feel it, you will need to take extra care to avoid injury. Because it may be weak, you may have difficulty moving it or using it. You may not know what position it is in without looking at it while the block is in effect.  3. For blocks in the legs and feet, returning to weight bearing and walking needs to be done carefully. You will need to wait until the numbness is entirely gone and the strength has returned. You should be able to move your leg and foot normally before you try and bear weight or walk. You will need someone to be with you when you first try to ensure you do not fall and possibly risk injury.  4. Bruising and tenderness at the needle site are common side effects and will resolve in a few days.  5. Persistent numbness or new problems with movement should be communicated to the surgeon or the [facility] [phone] [facility] [phone]). Regional Anesthesia Blocks  1. Numbness or the inability to move the "blocked" extremity may last from 3-48 hours after placement. The length of time depends on the medication injected and your individual response to the medication. If the numbness is not going away after 48 hours, call your surgeon.  2. The extremity that is blocked will need to be protected until the numbness is gone and the  Strength has returned. Because you cannot feel it, you will need to take extra care to avoid injury. Because it may be weak, you may have difficulty moving it or using it. You may not know what position it is in without looking at it while the block is in effect.  3. For blocks in the legs and feet, returning to weight bearing  and walking needs to be done carefully. You will need to wait until the numbness is entirely gone and the strength has returned. You should be able to move your leg and foot normally before you try and bear weight or walk. You will need someone to be with you when you first try to ensure you do not fall and possibly risk injury.  4. Bruising and tenderness at the needle site are common side effects and will resolve in a few days.  5. Persistent numbness or new problems with movement should be communicated to the surgeon or the [facility] [phone] [facility] [phone]).  Post Anesthesia Home Care Instructions  Activity: Get plenty of rest for the remainder of the day. A responsible adult should stay with you for 24 hours following the procedure.  For the next 24 hours, DO NOT: -Drive a car -Advertising copywriter -Drink alcoholic beverages -Take any medication unless instructed by your physician -Make any legal decisions or sign important papers.  Meals: Start with liquid foods such as gelatin or soup. Progress to regular foods as tolerated. Avoid greasy, spicy, heavy foods. If nausea and/or vomiting occur, drink only clear liquids until the nausea and/or vomiting subsides. Call your physician if vomiting continues.  Special Instructions/Symptoms: Your throat may feel dry or sore from the anesthesia or the breathing tube placed in your throat during surgery. If this causes discomfort, gargle with warm salt water. The discomfort should disappear within 24 hours.

## 2011-08-20 NOTE — Anesthesia Procedure Notes (Addendum)
Anesthesia Regional Block:  Popliteal block  Pre-Anesthetic Checklist: ,, timeout performed, Correct Patient, Correct Site, Correct Laterality, Correct Procedure, Correct Position, site marked, Risks and benefits discussed,  Surgical consent,  Pre-op evaluation,  At surgeon's request and post-op pain management  Laterality: Right  Prep: chloraprep       Needles:  Injection technique: Single-shot  Needle Type: Stimulator Needle - 80     Needle Length: 9cm  Needle Gauge: 22 and 22 G    Additional Needles:  Procedures: nerve stimulator Popliteal block  Nerve Stimulator or Paresthesia:  Response: toe dorsiflexion, 0.45 mA, 0.1 ms,  Response: toe abduction , 0.45 mA, 0.1 ms,   Additional Responses:   Narrative:  Injection made incrementally with aspirations every 5 mL.  Performed by: Personally  Anesthesiologist: Sandford Craze, MD  Additional Notes: Pt identified in Holding room.  Monitors applied. Working IV access confirmed. Sterile prep R lateral knee.  #22ga PNS to toe dorsiflexion, and toe abduction twitch at 0.37mA threshold.  30cc 0.5% Bupivacaine with 1:200k epi injected incrementally after negative test dose. Reprep prox, ant-medial tibia.  8cc 0.5% Bupivacaine infiltrated subq for saphenous nerve supplementation.  Patient asymptomatic, VSS, no heme aspirated, tolerated well.   Sandford Craze, MD  Popliteal block Procedure Name: Intubation Date/Time: 08/20/2011 10:43 AM Performed by: Verlan Friends Pre-anesthesia Checklist: Patient identified, Emergency Drugs available, Suction available, Patient being monitored and Timeout performed Patient Re-evaluated:Patient Re-evaluated prior to inductionOxygen Delivery Method: Circle System Utilized Preoxygenation: Pre-oxygenation with 100% oxygen Intubation Type: IV induction Ventilation: Mask ventilation without difficulty Laryngoscope Size: 3 and Miller Grade View: Grade II Tube type: Oral Tube size: 8.0 mm Number of  attempts: 1 Airway Equipment and Method: stylet and oral airway Placement Confirmation: ETT inserted through vocal cords under direct vision,  positive ETCO2 and breath sounds checked- equal and bilateral Tube secured with: Tape Dental Injury: Teeth and Oropharynx as per pre-operative assessment

## 2011-08-20 NOTE — Progress Notes (Signed)
Assisted Dr. Jackson with right, popliteal block. Side rails up, monitors on throughout procedure. See vital signs in flow sheet. Tolerated Procedure well. 

## 2011-08-20 NOTE — Brief Op Note (Signed)
08/20/2011  11:39 AM  PATIENT:  Raymond [name]  78 y.o. male  PRE-OPERATIVE DIAGNOSIS:  Right chronic achilles tendon tear, tight gastroc  POST-OPERATIVE DIAGNOSIS:  Right chronic achilles tendon tear, tight gastroc  PROCEDURE:  Procedure(s) (LRB): ACHILLES TENDON REPAIR (Right)  SURGEON:  Surgeon(s) and Role:    * Sherri Rad, MD - Primary  PHYSICIAN ASSISTANT: Rexene Edison, PAC   ASSISTANTS: Above   ANESTHESIA:   general  EBL:  Total I/O In: 1000 [I.V.:1000] Out: -   BLOOD ADMINISTERED:none  DRAINS: none   LOCAL MEDICATIONS USED:  NONE  SPECIMEN:  No Specimen  DISPOSITION OF SPECIMEN:  N/A  COUNTS:  YES  TOURNIQUET:  * Missing tourniquet times found for documented tourniquets in log:  [id number] *  DICTATION: .Other Dictation: Dictation Number [id number]  PLAN OF CARE: Discharge to home after PACU  PATIENT DISPOSITION:  PACU - hemodynamically stable.   Delay start of Pharmacological VTE agent (>24hrs) due to surgical blood loss or risk of bleeding: no

## 2011-08-20 NOTE — H&P (Signed)
  H&P documentation: Placed to be scanned history and physical exam in chart.  -History and Physical Reviewed  -Patient has been re-examined  -No change in the plan of care  Raymond [name] A  

## 2011-08-24 ENCOUNTER — Encounter (HOSPITAL_BASED_OUTPATIENT_CLINIC_OR_DEPARTMENT_OTHER): Payer: Self-pay | Admitting: Orthopedic Surgery

## 2011-08-24 NOTE — Op Note (Signed)
NAME:  Raymond [name], Raymond [name] NO.:  MEDICAL RECORD NO.:  [id number]  LOCATION:                                 FACILITY:  PHYSICIAN:  Leonides Grills, M.D.          DATE OF BIRTH:  DATE OF PROCEDURE:  08/20/2011 DATE OF DISCHARGE:                              OPERATIVE REPORT   PREOPERATIVE DIAGNOSES: 1. Chronic right Achilles tendon rupture. 2. Right tight gastroc.  POSTOPERATIVE DIAGNOSES: 1. Chronic right Achilles tendon rupture. 2. Right tight gastroc.  OPERATION: 1. Right primary repair of Achilles tendon. 2. Right gastroc slide. 3. Right plantaris to Achilles tendon transfer.  ANESTHESIA:  General.  SURGEON:  Leonides Grills, MD.  ASSISTANT:  Richardean Canal, PA-C.  ESTIMATED BLOOD LOSS:  Minimal.  TOURNIQUET TIME:  Approximately 40 minutes.  COMPLICATIONS:  None.  DISPOSITION:  Stable to PR.  INDICATION:  This is an 78 year old gentleman, who was taking a medication that caused him to get tendinopathy and eventually rupture. He has been limping with this since, and it is interfering with his life.  He was consented for the above procedure.  All risks, infection, nerve or vessel injury, re-rupture, persistent pain, worse pain, prolonged recovery, contracture, weakness, wound healing problems, DVT, PE, were all explained.  Questions were encouraged and answered.  OPERATION:  The patient was brought to the operating room and placed in a supine position.  After adequate general anesthesia was administered as well as Ancef 1 g IV piggyback.  The patient was then placed in a full lateral decubitus position with the operative side down on a beanbag.  All bony prominences well padded.  Axillary roll was placed. The right lower extremity was then prepped and draped in sterile manner over proximally the thigh tourniquet.  Limb was then gravity exsanguinated and tourniquet was elevated to 200 mmHg.  A longitudinal incision over the medial aspect of the  right gastrocnemius musculotendinous junction was then made.  Dissection was carried down through skin.  Hemostasis was obtained.  Fascia was opened in line with the incision.  Conjoined region was then developed in the gastroc soleus.  Soft tissue was then elevated off the posterior aspect.  The gastrocnemius and sural nerve identified and protected posteriorly. Gastrocnemius then released with curved Mayo scissors.  This had extra release of tight gastroc The area was copiously irrigated with normal saline.  Subcu was closed with 3-0 Vicryl.  Skin was closed with 4-0 nylon.  A longitudinal incision on the anteromedial aspect of the right Achilles tendon was then made.  Dissection was carried out through the skin.  Hemostasis was obtained.  Fascia was opened in line with the incision.  There was a large amount of scarring in this area.  The rupture site was then encountered.  Plantaris was also encountered as well.  The plantaris was then developed and dissected out for later transfer.  We then identified the rupture site.  We then placed a #2 FiberWire with modified Krackow stitch on the distal portion of the rupture and a modified Bunnell on the proximal portion of the rupture. We then placed the ankle equinus, reduced both sides, and then  sewed the 2 ends together.  This had outstanding repair.  We then circumferentially tucked in the suture and knot with overlying fascia and this was repaired with 3-0 Vicryl stitch.  Again, this had outstanding repair.  We then transferred the plantaris to Achilles tendon using 3-0 Vicryl stitch.  This had outstanding repair.  The area was copiously irrigated with normal saline.  Tourniquet deflated. Hemostasis was obtained.  There was no pulsatile bleeding.  Subcu was closed with 0 Vicryl.  Skin was closed with 4-0 nylon overall wounds and the gastroc was closed at this time as well.  I described gastroc being closed earlier, but it was actually  closed when the other wound was closed.  Sterile dressing was applied.  Gravity equinus Jones dressing was applied.  The patient was stable to PR.     Leonides Grills, M.D.     PB/MEDQ  D:  08/20/2011  T:  08/20/2011  Job:  [id number]

## 2011-10-01 ENCOUNTER — Telehealth: Payer: Self-pay | Admitting: Internal Medicine

## 2011-10-01 MED ORDER — PREDNISONE 20 MG PO TABS: 20.0000 mg | ORAL_TABLET | Freq: Two times a day (BID) | ORAL | Status: DC

## 2011-10-01 NOTE — Telephone Encounter (Signed)
Pt aware rx has been sent and nothing further was needed 

## 2011-10-05 ENCOUNTER — Ambulatory Visit: Payer: Medicare Other | Admitting: Internal Medicine

## 2011-10-06 ENCOUNTER — Ambulatory Visit: Payer: Medicare Other | Attending: Orthopedic Surgery | Admitting: Physical Therapy

## 2011-10-06 DIAGNOSIS — M25579 Pain in unspecified ankle and joints of unspecified foot: Secondary | ICD-10-CM | POA: Insufficient documentation

## 2011-10-06 DIAGNOSIS — M25673 Stiffness of unspecified ankle, not elsewhere classified: Secondary | ICD-10-CM | POA: Insufficient documentation

## 2011-10-06 DIAGNOSIS — R5381 Other malaise: Secondary | ICD-10-CM | POA: Insufficient documentation

## 2011-10-06 DIAGNOSIS — M25676 Stiffness of unspecified foot, not elsewhere classified: Secondary | ICD-10-CM | POA: Insufficient documentation

## 2011-10-06 DIAGNOSIS — IMO0001 Reserved for inherently not codable concepts without codable children: Secondary | ICD-10-CM | POA: Insufficient documentation

## 2011-10-06 DIAGNOSIS — R269 Unspecified abnormalities of gait and mobility: Secondary | ICD-10-CM | POA: Insufficient documentation

## 2011-10-11 ENCOUNTER — Ambulatory Visit: Payer: Medicare Other | Admitting: Physical Therapy

## 2011-10-15 ENCOUNTER — Ambulatory Visit: Payer: Medicare Other | Admitting: Physical Therapy

## 2011-10-20 ENCOUNTER — Ambulatory Visit: Payer: Medicare Other | Admitting: Physical Therapy

## 2011-10-22 ENCOUNTER — Ambulatory Visit: Payer: Medicare Other | Admitting: Physical Therapy

## 2011-10-26 ENCOUNTER — Ambulatory Visit: Payer: Medicare Other | Admitting: *Deleted

## 2011-11-02 ENCOUNTER — Ambulatory Visit (INDEPENDENT_AMBULATORY_CARE_PROVIDER_SITE_OTHER): Payer: Medicare Other | Admitting: Internal Medicine

## 2011-11-02 ENCOUNTER — Encounter: Payer: Self-pay | Admitting: Internal Medicine

## 2011-11-02 VITALS — BP 116/78 | HR 102 | Temp 97.8°F | Ht 66.0 in | Wt 176.0 lb

## 2011-11-02 DIAGNOSIS — J8410 Pulmonary fibrosis, unspecified: Secondary | ICD-10-CM

## 2011-11-02 MED ORDER — MOMETASONE FURO-FORMOTEROL FUM 200-5 MCG/ACT IN AERO: 2.0000 | INHALATION_SPRAY | Freq: Two times a day (BID) | RESPIRATORY_TRACT | Status: DC

## 2011-11-02 NOTE — Patient Instructions (Addendum)
Try prednisone 40 mg alternating with 20 mg per day total  and if not worse after 2 weeks, then continue 20 mg per day thereafter- ok to increase to 40 mg daily as a last resort but we're trying to keep this dose low.

## 2011-11-02 NOTE — Assessment & Plan Note (Signed)
-   onset ? 2007 with CT Chest 08/10/05 c/w PF esp RUL > LUL  - CT Chest suggestive of asbestosis 09/09/09 non contrasted  - Desat p 3 laps at [facility] office December 30, 2009  - PFTs February 20, 2010 FEV1 1.66 (67%) ratio 80, no better p B2, DLC0 55 > corrects to 123  - ESR 73 February 20, 2010 > Pred x 6 days only> better so started daily prednisone March 06, 2010  - Desats with fast walking 06/26/2010 on pred 5 mg per day with ESR 41  so increased to 10 mg daily > d/c 11/28/10 - 12/07/2010  Walked RA  2 laps @ 185 ft each stopped due to  desat to 85%,  ESR 58 > pred restarted 12/09/10 - 07/13/2011  Walked RA x 1 laps @ 185 ft each stopped due to  desat to 87%, ESR 22> no change pred - 11/02/2011  Walked RA x 3 laps @ 185 ft each stopped due to  Sat 88% > try 40/20 x 2weeks then 20 mg per day  Unclear what kind of dose response curve if any he really has but clearly not deteriorating on prednisone and so far no evidence of adverse drug rxn.  However, The goal with a chronic steroid dependent illness is always arriving at the lowest effective dose that controls the disease/symptoms and not accepting a set "formula" which is based on statistics or guidelines that don't always take into account patient  variability or the natural hx of the dz in every individual patient, which may well vary over time.  For now therefore I recommend the patient maintain  A ceiling of 40 mg and a floor of 20 mg per day

## 2011-11-02 NOTE — Progress Notes (Signed)
Subjective:    Patient ID: Raymond [name], male    DOB: 26-Feb-1930    MRN: [id number]      Brief patient profile:  82 yowm quit smoking in 1982 with improvement but never complete resolution in cough since then referred by Dr Christell Constant for doe starting in the summer of 2011 and PF on cxr/ct.   December 30, 2009 1st pulmonary office eval cc doe x rapid walk or uphills but does ok flat indolent onset but not progressive since around June 2011, cough is present x years, worse with colds, no better with symbicort, better breathing and coughing after symbicort and prednisone together. hfa poor  rec work on technique and gerd diet/ ppi two times a day.   February 10, 2010 ov sob no better but has learned to pace himself. following diet and using ppi/ symbicort but still struggling with hfa.  rec work on hfa, come to [employer] office for pft   February 20, 2010 ov cc worsening doe, still struggling with hfa. minimal cough. ESR 73 rec short course of prednisone helped  March 05, 2010 ov some better esp cough with delsym and prednisone rec start prednisone 10 mg 2 daily with bfast until improve then 1 daily  Symbicort  stop   April 03, 2010 ov cough and breathing both better to his satisfaction on the 20 p reduced it to 10 mg per day and no change no following diet, chewing mint gum and using oil based vit d still.  rec diet, taper pred as tol to floor of 5 mg daily  06/26/2010 ov cc sob and cough ok on Prednisone to 5 mg daily but since taper has [name] clear nasal drainage and throat clearing.  >>pred 10mg    09/24/10 Follow up  Pt returns for 3 month follow up pulmonary fibrosis. Since last ov he feel dyspnea is unchanged. Main compliant is post nasal drainage, throat clearing-mostly during daytime. Has minimal cough w/ clear mucus at times w/ no flare. Continue on prednisone 10mg  . No significant change with increased steroid dose.  Took zyrtec without much help.  No discolored mucus or fever  Take  Zyrtec10mg  daily  Add Chlor tabs 4mg  2 At bedtime   Avoid throat clearing , NO MINTS, use sugarless candy, ice chips to sooth throat.  Decrease Prednisone 5mg  daily  Now, then August 1st take 5mg   Every other day x 1 month and then stop      12/07/2010 f/u ov/Raymond [name] off prednisone since Sept 1  cc sob with [name] than slow walking but not convinced he's worse off prednisone at this point. no change cough, sense of pnds on or off prednisone nor assoc arthrtiris  rec  Sinus CT> min thickening only If condition worsens start prednisone at 40 mg daily until better then 20 mg daily thereafter.     12/29/2010 f/u ov/Raymond [name] cc much better p restarting prednisone 9/13 @ 40 mg per day. Tapered to 20 mg per day. Cough also much better.  Concerned about wt gain but he's been much [name] active since pred rx and no sign wt gain yet.  rec Rec you check vit D level and let Dr Christell Constant check your bone density. The goal with prednisone use it to find the lowest effective dose so don't taper it below 10 mg per day and slow your taper as follows If ok after 2 weeks, try Prednisone 20mg  on even days and 10 on odd  If after 2 weeks try Prednisone  10 mg daily as your new floor  If worsen go back to the previous dose that worked.   04/13/2011 f/u ov/Raymond [name]   @ 20mg  prednisone a day.cc  when he tried to decrease the dose (within  Few days) he had increased SOB and cough. Pt staes [name] checked his vitamin d level and it was normal.  Cc doe x get in a hurry, if takes time can walk anywhere he wants, ok mailbox and back if goes slow rec Start Advair 250  One puff twice a day, smooth deep breath then rinse and gargle  The goal with prednisone use it to find the lowest effective dose so don't taper it below 10 mg per day and slow your taper as follows   try Prednisone 20mg  on even days and 10 on odd  If after 2 weeks try Prednisone 10 mg daily as your new floor   06/01/2011 f/u ov/Raymond [name] cc worse x 2 weeks with yellow  mucus rx with 40 mg pred and levaquin (x 7 days planned) and nebulizer while on advair.  Getting back to baseline activities but still not there due to doe. No [name] purulent sputum. No resting sob. rec Prednisone 40 mg per day until you are back to your usual self, then 40 alternating with 20 mg for a week then stay on 20mg  per day. Stop advair and just use the albuterol nebulizer as needed I called in the prednisone 20 mg tablets to your pharmacy Please schedule a follow up office visit in 6 weeks, call sooner if needed in Raymond [name] with walking saturations   07/13/2011 f/u ov/Raymond [name] still on prednisone one tablet ? Dose daily, no cough but breathing worse gradually x 5-6 weeks,  Did not follow advice to try off advair, chewing mint gum on arrival. No overt sinus or hb symptoms. rec Stop advair If breathing is bad ok to use dulera 200 up 2 puffs every 12 hours If not responding to dulera add the nebulizer up to 4 hours GERD diet. Leave the prednisone dose unless bad flare of breathing or coughing, then double it Please remember to go to the lab and x-ray>  Esr 22   08/03/2011 f/u ov/Raymond [name] on prednisone 40 mg per day for increase cough and sob cc sob [name] than room to room but really not helping - says neb helps the most but rarely uses it. Cough is dry, no overt HB or sinus complaints.  Main concern is limted by bilateral ankle pain with new dx of achilles rupture on R rec Work on perfecting  inhaler technique and continue dulera  Add pepcid ac 20 mg one at time    If breathing worse, first try nebulizer up to every 4 hours  Reduce prednisone back to 20 mg per day and only increase to 40 mg per day as last resort    11/02/2011 f/u ov/Raymond [name] misunderstood instructions and continued pred at 40 mg per day but does feel the dulera is helping and rarely using neb.  No unusual cough, purulent sputum or sinus/hb symptoms on present rx.  Sleeping ok without nocturnal  or early am exacerbation  of  respiratory  c/o's  Also denies any obvious fluctuation of symptoms with weather or environmental changes or other aggravating or alleviating factors except as outlined above.   ROS  The following are not active complaints unless bolded sore throat, dysphagia, dental problems, itching, sneezing,  nasal congestion or excess/ purulent secretions, ear ache,   fever, chills,  sweats, unintended wt loss, pleuritic or exertional cp, hemoptysis,  orthopnea pnd or leg swelling, presyncope, palpitations, heartburn, abdominal pain, anorexia, nausea, vomiting, diarrhea  or change in bowel or urinary habits, change in stools or urine, dysuria,hematuria,  rash, arthralgias, visual complaints, headache, numbness weakness or ataxia or problems with walking or coordination,  change in mood/affect or memory.          Past Medical History:  Hypothyroidism  Hyperlipidemia  BPH  ?Asthma  - PFT's wnl 07/2005 > no better on symbicort so d/c March 06, 2010  - HFA 50% p coaching December 30, 2009 > 50% February 10, 2010 > 75% February 20, 2010  - Allergy profile neg 02/20/10  Coronary Heart Disease  Interstitial lung dz  - onset ? 2007 with CT Chest 08/10/05 c/w PF esp RUL > LUL  - CT Chest suggestive of asbestosis 09/09/09 non contrasted  - Daily prednisone rx since 12/09/2010                   Objective:   Physical Exam   wt 177 February 10, 2010   > 12/29/2010  180 >   08/03/2011  182 > 11/02/2011  176 pleasant amb wm nad, mild hoarseness  HEENT: nl dentition, turbinates, and orophanx. Nl external ear canals without cough reflex  NECK : without JVD/Nodes/TM/ nl carotid upstrokes bilaterally  LUNGS: no acc muscle use, trace insp crackles bilaterally  CV: RRR no s3 or murmur or increase in P2, no edema  ABD: soft and nontender with nl excursion in the supine position. No bruits or organomegaly, bowel sounds nl  MS: warm without deformities, calf tenderness, cyanosis  POS mild clubbing    CXR  07/13/2011  :  Chronic lung disease changes as above, most severe in the upper lobes right greater than left and at the right base. No definite interval change   Assessment & Plan:

## 2011-11-30 ENCOUNTER — Telehealth: Payer: Self-pay | Admitting: *Deleted

## 2011-11-30 NOTE — Telephone Encounter (Signed)
LMTCBx1 to schedule OV in [place] on 12-28-11. Carron Curie, CMA

## 2011-12-14 NOTE — Telephone Encounter (Signed)
LMTCBx2. Jennifer Castillo, CMA  

## 2011-12-17 ENCOUNTER — Telehealth: Payer: Self-pay | Admitting: Internal Medicine

## 2011-12-17 NOTE — Telephone Encounter (Signed)
Called and spoke to pt on 12/17/11 to make next ov per recall.  Pt stated he is going to see another pulmonary dr to get a second opinion and he will call us if he needs [place]. Leanora Ivanoff

## 2011-12-22 ENCOUNTER — Other Ambulatory Visit: Payer: Self-pay

## 2011-12-22 DIAGNOSIS — J8410 Pulmonary fibrosis, unspecified: Secondary | ICD-10-CM

## 2011-12-22 NOTE — Telephone Encounter (Signed)
Raymond [name] 12/17/2011 2:34 PM Signed  Called and spoke to pt on 12/17/11 to make next ov per recall. Pt stated he is going to see another pulmonary dr to get a second opinion and he will call us if he needs [place]. Raymond [name]

## 2011-12-27 ENCOUNTER — Ambulatory Visit (HOSPITAL_COMMUNITY)
Admission: RE | Admit: 2011-12-27 | Discharge: 2011-12-27 | Payer: Medicare Other | Source: Ambulatory Visit | Attending: Pulmonary Disease | Admitting: Pulmonary Disease

## 2011-12-27 ENCOUNTER — Ambulatory Visit (HOSPITAL_COMMUNITY)
Admission: RE | Admit: 2011-12-27 | Discharge: 2011-12-27 | Disposition: A | Discharge status: Home / Self Care | Payer: Medicare Other | Source: Ambulatory Visit | Attending: Pulmonary Disease | Admitting: Pulmonary Disease

## 2011-12-27 DIAGNOSIS — J8410 Pulmonary fibrosis, unspecified: Secondary | ICD-10-CM | POA: Insufficient documentation

## 2011-12-27 DIAGNOSIS — R0609 Other forms of dyspnea: Secondary | ICD-10-CM | POA: Insufficient documentation

## 2011-12-27 DIAGNOSIS — R0989 Other specified symptoms and signs involving the circulatory and respiratory systems: Secondary | ICD-10-CM | POA: Insufficient documentation

## 2011-12-27 LAB — BLOOD GAS, ARTERIAL
Bicarbonate: 25.3 mEq/L — ABNORMAL HIGH (ref 20.0–24.0)
TCO2: 22.2 mmol/L (ref 0–100)
pH, Arterial: 7.452 — ABNORMAL HIGH (ref 7.350–7.450)
pO2, Arterial: 72.1 mmHg — ABNORMAL LOW (ref 80.0–100.0)

## 2011-12-27 MED ORDER — ALBUTEROL SULFATE (5 MG/ML) 0.5% IN NEBU
2.5000 mg | INHALATION_SOLUTION | Freq: Once | RESPIRATORY_TRACT | Status: AC
  Administered 2011-12-27: 2.5 mg via RESPIRATORY_TRACT

## 2011-12-31 NOTE — Procedures (Signed)
NAME:  DREYDON, CARDENAS NO.:  [id number]  MEDICAL RECORD NO.:  [id number]  LOCATION:                                 FACILITY:  PHYSICIAN:  Janaiya Beauchesne L. Juanetta Gosling, M.D.DATE OF BIRTH:  Nov 10, 1929  DATE OF PROCEDURE: DATE OF DISCHARGE:                           PULMONARY FUNCTION TEST   Reason for pulmonary function testing is pulmonary fibrosis.  1. Spirometry shows a severe ventilatory defect with no definite     airflow obstruction. 2. Lung volumes show severe reduction of total lung capacity. 3. Diffusing capacity is severely reduced. 4. Airway resistance is slightly elevated. 5. Arterial blood gas is normal. 6. This study is consistent with pulmonary fibrosis.     Lindey Renzulli L. Juanetta Gosling, M.D.     ELH/MEDQ  D:  12/29/2011  T:  12/30/2011  Job:  [id number]

## 2012-01-06 ENCOUNTER — Other Ambulatory Visit (HOSPITAL_COMMUNITY): Payer: Self-pay | Admitting: Pulmonary Disease

## 2012-01-06 DIAGNOSIS — R0602 Shortness of breath: Secondary | ICD-10-CM

## 2012-01-10 ENCOUNTER — Ambulatory Visit (HOSPITAL_COMMUNITY)
Admission: RE | Admit: 2012-01-10 | Discharge: 2012-01-10 | Disposition: A | Discharge status: Home / Self Care | Payer: Medicare Other | Source: Ambulatory Visit | Attending: Pulmonary Disease | Admitting: Pulmonary Disease

## 2012-01-10 DIAGNOSIS — R0602 Shortness of breath: Secondary | ICD-10-CM | POA: Insufficient documentation

## 2012-01-10 DIAGNOSIS — R918 Other nonspecific abnormal finding of lung field: Secondary | ICD-10-CM | POA: Insufficient documentation

## 2012-01-10 DIAGNOSIS — Z87891 Personal history of nicotine dependence: Secondary | ICD-10-CM | POA: Insufficient documentation

## 2012-03-27 ENCOUNTER — Inpatient Hospital Stay (HOSPITAL_COMMUNITY): Payer: Medicare Other

## 2012-03-27 ENCOUNTER — Inpatient Hospital Stay (HOSPITAL_COMMUNITY)
Admission: AD | Admit: 2012-03-27 | Discharge: 2012-03-30 | DRG: 193 | Disposition: A | Discharge status: Home / Self Care | Payer: Medicare Other | Source: Ambulatory Visit | Attending: Pulmonary Disease | Admitting: Pulmonary Disease

## 2012-03-27 ENCOUNTER — Encounter (HOSPITAL_COMMUNITY): Payer: Self-pay | Admitting: *Deleted

## 2012-03-27 DIAGNOSIS — T380X5A Adverse effect of glucocorticoids and synthetic analogues, initial encounter: Secondary | ICD-10-CM | POA: Diagnosis present

## 2012-03-27 DIAGNOSIS — Z87891 Personal history of nicotine dependence: Secondary | ICD-10-CM

## 2012-03-27 DIAGNOSIS — E039 Hypothyroidism, unspecified: Secondary | ICD-10-CM | POA: Diagnosis present

## 2012-03-27 DIAGNOSIS — R7309 Other abnormal glucose: Secondary | ICD-10-CM | POA: Diagnosis present

## 2012-03-27 DIAGNOSIS — K219 Gastro-esophageal reflux disease without esophagitis: Secondary | ICD-10-CM | POA: Diagnosis present

## 2012-03-27 DIAGNOSIS — J9600 Acute respiratory failure, unspecified whether with hypoxia or hypercapnia: Secondary | ICD-10-CM | POA: Diagnosis present

## 2012-03-27 DIAGNOSIS — M129 Arthropathy, unspecified: Secondary | ICD-10-CM | POA: Diagnosis present

## 2012-03-27 DIAGNOSIS — J8410 Pulmonary fibrosis, unspecified: Secondary | ICD-10-CM | POA: Diagnosis present

## 2012-03-27 DIAGNOSIS — I2510 Atherosclerotic heart disease of native coronary artery without angina pectoris: Secondary | ICD-10-CM | POA: Diagnosis present

## 2012-03-27 DIAGNOSIS — E785 Hyperlipidemia, unspecified: Secondary | ICD-10-CM | POA: Diagnosis present

## 2012-03-27 DIAGNOSIS — N400 Enlarged prostate without lower urinary tract symptoms: Secondary | ICD-10-CM | POA: Diagnosis present

## 2012-03-27 DIAGNOSIS — J101 Influenza due to other identified influenza virus with other respiratory manifestations: Principal | ICD-10-CM | POA: Diagnosis present

## 2012-03-27 DIAGNOSIS — Z79899 Other long term (current) drug therapy: Secondary | ICD-10-CM

## 2012-03-27 LAB — CBC WITH DIFFERENTIAL/PLATELET
Basophils Relative: 0 % (ref 0–1)
HCT: 40 % (ref 39.0–52.0)
Hemoglobin: 13.1 g/dL (ref 13.0–17.0)
Lymphocytes Relative: 4 % — ABNORMAL LOW (ref 12–46)
Lymphs Abs: 0.3 10*3/uL — ABNORMAL LOW (ref 0.7–4.0)
MCHC: 32.8 g/dL (ref 30.0–36.0)
Monocytes Absolute: 0.3 10*3/uL (ref 0.1–1.0)
Monocytes Relative: 3 % (ref 3–12)
Neutro Abs: 8.7 10*3/uL — ABNORMAL HIGH (ref 1.7–7.7)
Neutrophils Relative %: 93 % — ABNORMAL HIGH (ref 43–77)
RBC: 4.4 MIL/uL (ref 4.22–5.81)
WBC: 9.4 10*3/uL (ref 4.0–10.5)

## 2012-03-27 LAB — BASIC METABOLIC PANEL
BUN: 14 mg/dL (ref 6–23)
CO2: 31 mEq/L (ref 19–32)
Chloride: 96 mEq/L (ref 96–112)
GFR calc Af Amer: >90 mL/min (ref >90)
Potassium: 3.9 mEq/L (ref 3.5–5.1)

## 2012-03-27 LAB — GLUCOSE, CAPILLARY: Glucose-Capillary: 216 mg/dL — ABNORMAL HIGH (ref 70–99)

## 2012-03-27 MED ORDER — SODIUM CHLORIDE 0.9 % IV SOLN
250.0000 mL | INTRAVENOUS | Status: DC | PRN
  Administered 2012-03-27 – 2012-03-29 (×2): 250 mL via INTRAVENOUS

## 2012-03-27 MED ORDER — ALBUTEROL SULFATE (5 MG/ML) 0.5% IN NEBU
2.5000 mg | INHALATION_SOLUTION | RESPIRATORY_TRACT | Status: DC
  Administered 2012-03-27 – 2012-03-30 (×15): 2.5 mg via RESPIRATORY_TRACT
  Filled 2012-03-27 (×17): qty 0.5

## 2012-03-27 MED ORDER — SODIUM CHLORIDE 0.9 % IJ SOLN
3.0000 mL | Freq: Two times a day (BID) | INTRAMUSCULAR | Status: DC
  Administered 2012-03-27 – 2012-03-29 (×4): 3 mL via INTRAVENOUS

## 2012-03-27 MED ORDER — TERAZOSIN HCL 5 MG PO CAPS
10.0000 mg | ORAL_CAPSULE | Freq: Every day | ORAL | Status: DC
  Administered 2012-03-27 – 2012-03-29 (×3): 10 mg via ORAL
  Filled 2012-03-27: qty 1
  Filled 2012-03-27 (×2): qty 2

## 2012-03-27 MED ORDER — ACETAMINOPHEN 650 MG RE SUPP: 650.0000 mg | Freq: Four times a day (QID) | RECTAL | Status: DC | PRN

## 2012-03-27 MED ORDER — METHYLPREDNISOLONE SODIUM SUCC 125 MG IJ SOLR
125.0000 mg | Freq: Four times a day (QID) | INTRAMUSCULAR | Status: DC
  Administered 2012-03-27 – 2012-03-29 (×8): 125 mg via INTRAVENOUS
  Filled 2012-03-27 (×8): qty 2

## 2012-03-27 MED ORDER — PANTOPRAZOLE SODIUM 40 MG PO TBEC
40.0000 mg | DELAYED_RELEASE_TABLET | Freq: Every day | ORAL | Status: DC
  Administered 2012-03-27 – 2012-03-29 (×3): 40 mg via ORAL
  Filled 2012-03-27 (×3): qty 1

## 2012-03-27 MED ORDER — INSULIN ASPART 100 UNIT/ML ~~LOC~~ SOLN
0.0000 [IU] | Freq: Three times a day (TID) | SUBCUTANEOUS | Status: DC
  Administered 2012-03-28 (×2): 4 [IU] via SUBCUTANEOUS
  Administered 2012-03-28: 11 [IU] via SUBCUTANEOUS
  Administered 2012-03-29: 4 [IU] via SUBCUTANEOUS
  Administered 2012-03-29: 7 [IU] via SUBCUTANEOUS
  Administered 2012-03-29: 4 [IU] via SUBCUTANEOUS
  Administered 2012-03-30: 7 [IU] via SUBCUTANEOUS
  Administered 2012-03-30: 4 [IU] via SUBCUTANEOUS

## 2012-03-27 MED ORDER — IOHEXOL 300 MG/ML  SOLN
80.0000 mL | Freq: Once | INTRAMUSCULAR | Status: AC | PRN
  Administered 2012-03-27: 80 mL via INTRAVENOUS

## 2012-03-27 MED ORDER — LEVOFLOXACIN IN D5W 500 MG/100ML IV SOLN
500.0000 mg | INTRAVENOUS | Status: DC
  Administered 2012-03-27 – 2012-03-29 (×3): 500 mg via INTRAVENOUS
  Filled 2012-03-27 (×5): qty 100

## 2012-03-27 MED ORDER — MOMETASONE FURO-FORMOTEROL FUM 200-5 MCG/ACT IN AERO
2.0000 | INHALATION_SPRAY | Freq: Two times a day (BID) | RESPIRATORY_TRACT | Status: DC
  Administered 2012-03-27 – 2012-03-30 (×7): 2 via RESPIRATORY_TRACT
  Filled 2012-03-27: qty 8.8

## 2012-03-27 MED ORDER — BIOTENE DRY MOUTH MT LIQD
15.0000 mL | Freq: Two times a day (BID) | OROMUCOSAL | Status: DC
  Administered 2012-03-27 – 2012-03-29 (×5): 15 mL via OROMUCOSAL

## 2012-03-27 MED ORDER — LEVOTHYROXINE SODIUM 25 MCG PO TABS
125.0000 ug | ORAL_TABLET | Freq: Every day | ORAL | Status: DC
  Administered 2012-03-28 – 2012-03-30 (×3): 125 ug via ORAL
  Filled 2012-03-27 (×4): qty 1

## 2012-03-27 MED ORDER — SODIUM CHLORIDE 0.9 % IJ SOLN: 3.0000 mL | INTRAMUSCULAR | Status: DC | PRN

## 2012-03-27 MED ORDER — TRAZODONE HCL 50 MG PO TABS
25.0000 mg | ORAL_TABLET | Freq: Every evening | ORAL | Status: DC | PRN
  Administered 2012-03-29: 25 mg via ORAL
  Filled 2012-03-27: qty 1

## 2012-03-27 MED ORDER — ATORVASTATIN CALCIUM 40 MG PO TABS
40.0000 mg | ORAL_TABLET | Freq: Every day | ORAL | Status: DC
  Administered 2012-03-27 – 2012-03-29 (×3): 40 mg via ORAL
  Filled 2012-03-27 (×3): qty 1

## 2012-03-27 MED ORDER — CHLORHEXIDINE GLUCONATE 0.12 % MT SOLN
15.0000 mL | Freq: Two times a day (BID) | OROMUCOSAL | Status: DC
  Administered 2012-03-27 – 2012-03-30 (×6): 15 mL via OROMUCOSAL
  Filled 2012-03-27 (×4): qty 15

## 2012-03-27 MED ORDER — GUAIFENESIN ER 600 MG PO TB12
600.0000 mg | ORAL_TABLET | Freq: Two times a day (BID) | ORAL | Status: DC
  Administered 2012-03-27 – 2012-03-30 (×7): 600 mg via ORAL
  Filled 2012-03-27 (×7): qty 1

## 2012-03-27 MED ORDER — ACETAMINOPHEN 325 MG PO TABS: 650.0000 mg | ORAL_TABLET | Freq: Four times a day (QID) | ORAL | Status: DC | PRN

## 2012-03-27 MED ORDER — ONDANSETRON HCL 4 MG/2ML IJ SOLN: 4.0000 mg | Freq: Four times a day (QID) | INTRAMUSCULAR | Status: DC | PRN

## 2012-03-27 MED ORDER — INSULIN ASPART 100 UNIT/ML ~~LOC~~ SOLN
0.0000 [IU] | Freq: Every day | SUBCUTANEOUS | Status: DC
  Administered 2012-03-27: 2 [IU] via SUBCUTANEOUS

## 2012-03-27 MED ORDER — ENOXAPARIN SODIUM 40 MG/0.4ML ~~LOC~~ SOLN
40.0000 mg | SUBCUTANEOUS | Status: DC
  Administered 2012-03-27 – 2012-03-29 (×3): 40 mg via SUBCUTANEOUS
  Filled 2012-03-27 (×3): qty 0.4

## 2012-03-27 MED ORDER — ONDANSETRON HCL 4 MG PO TABS: 4.0000 mg | ORAL_TABLET | Freq: Four times a day (QID) | ORAL | Status: DC | PRN

## 2012-03-27 MED ORDER — IPRATROPIUM BROMIDE 0.02 % IN SOLN
0.5000 mg | RESPIRATORY_TRACT | Status: DC
  Administered 2012-03-27 – 2012-03-30 (×16): 0.5 mg via RESPIRATORY_TRACT
  Filled 2012-03-27 (×17): qty 2.5

## 2012-03-27 NOTE — H&P (Signed)
Raymond [name] MRN: [id number] DOB/AGE: 76-03-1929 76 y.o. Primary Care Physician:MOORE, DONALD, MD Admit date: 03/27/2012 Chief Complaint: Shortness of breath HPI: This is an 78 year old with known pulmonary fibrosis. He was doing fairly well and went to see his primary care physician and was noted to be much more short of breath than previously. He had some feeling of being chilly but no fever. He had chest x-ray done that showed pulmonary fibrosis but no definite change. Because he was so short of breath and hypoxic he was sent here for admission. He is being set up for evaluation of the [facility] interstitial lung disease clinic but that has not occurred yet.  Past Medical History  Diagnosis Date  . Hypothyroid   . Hyperlipemia   . BPH (benign prostatic hyperplasia)   . ILD (interstitial lung disease)   . COPD (chronic obstructive pulmonary disease)   . CAD (coronary artery disease)   . Arthritis   . GERD (gastroesophageal reflux disease)   . Achilles tendon rupture    Past Surgical History  Procedure Date  . Cholecystectomy   . Appendectomy   . Eye surgery     cataracts  . Colonoscopy   . Achilles tendon surgery 08/20/2011    Procedure: ACHILLES TENDON REPAIR;  Surgeon: Sherri Rad, MD;  Location: [facility];  Service: Orthopedics;  Laterality: Right;  Right primary achilles tendon repair, gastroc slide        History reviewed. No pertinent family history.  Social History:  reports that he quit smoking about 32 years ago. He does not have any smokeless tobacco history on file. He reports that he does not drink alcohol. His drug history not on file.   Allergies:  Allergies  Allergen Reactions  . Codeine Other (See Comments)    hallucinations    Medications Prior to Admission  Medication Sig Dispense Refill  . albuterol (PROVENTIL) (2.5 MG/3ML) 0.083% nebulizer solution Take 2.5 mg by nebulization every 6 (six) hours as needed.      .  Cholecalciferol (VITAMIN D) 2000 UNITS CAPS Take 1 capsule by mouth daily.        . famotidine (PEPCID) 20 MG tablet One at bedtime  30 tablet  11  . levothyroxine (SYNTHROID, LEVOTHROID) 125 MCG tablet 125 mcg. Take 1 tablet daily except on Sat and Sun take 1/2 tablet       . levothyroxine (SYNTHROID, LEVOTHROID) 25 MCG tablet Take 1 every mon, wed, and fri       . Mometasone Furo-Formoterol Fum (DULERA) 200-5 MCG/ACT AERO Inhale 2 puffs into the lungs 2 (two) times daily.  1 Inhaler  11  . omeprazole (PRILOSEC) 20 MG capsule Take 20 mg by mouth 2 (two) times daily before a meal.        . predniSONE (DELTASONE) 20 MG tablet Take 1 tablet (20 mg total) by mouth 2 (two) times daily.  60 tablet  5  . simvastatin (ZOCOR) 80 MG tablet Take 80 mg by mouth at bedtime.        Marland Kitchen terazosin (HYTRIN) 10 MG capsule Take 10 mg by mouth at bedtime.       . vitamin C (ASCORBIC ACID) 500 MG tablet Take 1 tablet (500 mg total) by mouth daily.  90 tablet  0       UYQ:IHKVQ from the symptoms mentioned above,there are no other symptoms referable to all systems reviewed.  Physical Exam: Blood pressure 117/63, pulse 117, temperature 99.7 F (37.6 C), temperature  source Oral, resp. rate 20, height 5\' 6"  (1.676 m), weight 79.379 kg (175 lb), SpO2 97.00%. He is awake and alert. He is mildly cushingoid. He looks mildly short of breath at rest. His pupils are reactive. His nose and throat clear. His mucous membranes are moist. His neck is supple without masses. He does not have any JVD. His chest shows rales bilaterally. His heart is regular without gallop. His abdomen is soft without masses. He has 1+ edema of the lower extremities bilaterally. His central nervous system exam is grossly intact    Basename 03/27/12 1617  WBC 9.4  NEUTROABS 8.7*  HGB 13.1  HCT 40.0  MCV 90.9  PLT 160    Basename 03/27/12 1617  NA 136  K 3.9  CL 96  CO2 31  GLUCOSE 206*  BUN 14  CREATININE 0.85  CALCIUM 9.0  MG --    lablast2(ast:2,ALT:2,alkphos:2,bilitot:2,prot:2,albumin:2)@    No results found for this or any previous visit (from the past 240 hour(s)).   No results found. Impression: He has pulmonary fibrosis and based on his history I think he may have pneumonia. This is not apparent on plain films. His blood sugar is up. He has edema and worsened breathing so he could have congestive heart failure and that we'll be checked. Active Problems:  * No active hospital problems. *      Plan: He will be started on antibiotics steroids he will have echocardiogram and have a CT of the chest and then I may need to change his therapy based on the results of all of the above. He will have glucose checks and a sliding scale      Neila Teem L Pager [phone]  03/27/2012, 6:51 PM

## 2012-03-27 NOTE — Plan of Care (Signed)
Problem: ICU Phase Progression Outcomes Goal: O2 sats trending toward baseline Outcome: Not Met (add Reason) Pt on O2-2l/[place]  Problem: Phase I Progression Outcomes Goal: O2 sats > or equal 90% or at baseline Outcome: Progressing Pt on O2 2l/[place] Goal: Discharge plan established Outcome: Adequate for Discharge Plan to d/c home with family

## 2012-03-28 DIAGNOSIS — I517 Cardiomegaly: Secondary | ICD-10-CM

## 2012-03-28 LAB — GLUCOSE, CAPILLARY
Glucose-Capillary: 179 mg/dL — ABNORMAL HIGH (ref 70–99)
Glucose-Capillary: 178 mg/dL — ABNORMAL HIGH (ref 70–99)

## 2012-03-28 LAB — EXPECTORATED SPUTUM ASSESSMENT W GRAM STAIN, RFLX TO RESP C

## 2012-03-28 LAB — HEMOGLOBIN A1C: Hgb A1c MFr Bld: 6.8 % — ABNORMAL HIGH (ref <5.7)

## 2012-03-28 MED ORDER — OSELTAMIVIR PHOSPHATE 75 MG PO CAPS
75.0000 mg | ORAL_CAPSULE | Freq: Two times a day (BID) | ORAL | Status: DC
  Administered 2012-03-28 – 2012-03-30 (×5): 75 mg via ORAL
  Filled 2012-03-28 (×5): qty 1

## 2012-03-28 NOTE — Progress Notes (Signed)
Subjective: He says he feels better. His blood sugar has been elevated and he is on sliding scale insulin. His CT did not show pneumonia  Objective: Vital signs in last 24 hours: Temp:  [97.1 F (36.2 C)-99.7 F (37.6 C)] 97.1 F (36.2 C) (12/31 0546) Pulse Rate:  [75-117] 83  (12/31 0546) Resp:  [18-20] 19  (12/31 0546) BP: (113-136)/(63-98) 115/71 mmHg (12/31 0546) SpO2:  [87 %-97 %] 93 % (12/31 0546) Weight:  [79.379 kg (175 lb)-86.8 kg (191 lb 5.8 oz)] 86.8 kg (191 lb 5.8 oz) (12/31 0423) Weight change:  Last BM Date: 03/26/12  Intake/Output from previous day: 12/30 0701 - 12/31 0700 In: 240 [P.O.:240] Out: 400 [Urine:400]  PHYSICAL EXAM General appearance: alert, cooperative and mild distress Resp: rales bilaterally and rhonchi bilaterally Cardio: regular rate and rhythm, S1, S2 normal, no murmur, click, rub or gallop GI: soft, non-tender; bowel sounds normal; no masses,  no organomegaly Extremities: extremities normal, atraumatic, no cyanosis or edema  Lab Results:    Basic Metabolic Panel:  Basename 03/27/12 1617  NA 136  K 3.9  CL 96  CO2 31  GLUCOSE 206*  BUN 14  CREATININE 0.85  CALCIUM 9.0  MG --  PHOS --   Liver Function Tests: No results found for this basename: AST:2,ALT:2,ALKPHOS:2,BILITOT:2,PROT:2,ALBUMIN:2 in the last 72 hours No results found for this basename: LIPASE:2,AMYLASE:2 in the last 72 hours No results found for this basename: AMMONIA:2 in the last 72 hours CBC:  Basename 03/27/12 1617  WBC 9.4  NEUTROABS 8.7*  HGB 13.1  HCT 40.0  MCV 90.9  PLT 160   Cardiac Enzymes: No results found for this basename: CKTOTAL:3,CKMB:3,CKMBINDEX:3,TROPONINI:3 in the last 72 hours BNP: No results found for this basename: PROBNP:3 in the last 72 hours D-Dimer: No results found for this basename: DDIMER:2 in the last 72 hours CBG:  Basename 03/28/12 0735 03/27/12 2110  GLUCAP 179* 216*   Hemoglobin A1C: No results found for this  basename: HGBA1C in the last 72 hours Fasting Lipid Panel: No results found for this basename: CHOL,HDL,LDLCALC,TRIG,CHOLHDL,LDLDIRECT in the last 72 hours Thyroid Function Tests: No results found for this basename: TSH,T4TOTAL,FREET4,T3FREE,THYROIDAB in the last 72 hours Anemia Panel: No results found for this basename: VITAMINB12,FOLATE,FERRITIN,TIBC,IRON,RETICCTPCT in the last 72 hours Coagulation: No results found for this basename: LABPROT:2,INR:2 in the last 72 hours Urine Drug Screen: Drugs of Abuse  No results found for this basename: labopia, cocainscrnur, labbenz, amphetmu, thcu, labbarb    Alcohol Level: No results found for this basename: ETH:2 in the last 72 hours Urinalysis: No results found for this basename: COLORURINE:2,APPERANCEUR:2,LABSPEC:2,PHURINE:2,GLUCOSEU:2,HGBUR:2,BILIRUBINUR:2,KETONESUR:2,PROTEINUR:2,UROBILINOGEN:2,NITRITE:2,LEUKOCYTESUR:2 in the last 72 hours Misc. Labs:  ABGS No results found for this basename: PHART,PCO2,PO2ART,TCO2,HCO3 in the last 72 hours CULTURES No results found for this or any previous visit (from the past 240 hour(s)). Studies/Results: Ct Chest W Contrast  03/27/2012  *RADIOLOGY REPORT*  Clinical Data: 78 year old male with pulmonary fibrosis. Productive cough.  Severe shortness of breath.  CT CHEST WITH CONTRAST  Technique:  Multidetector CT imaging of the chest was performed following the standard protocol during bolus administration of intravenous contrast.  Contrast: 80mL OMNIPAQUE IOHEXOL 300 MG/ML  SOLN  Comparison: 01/10/2012 and earlier.  Findings: Major airways are stable.  There is some tracheomegaly again noted.  There is perihilar bronchiectasis and peribronchial thickening.  There is extensive peripheral reticular opacity with honeycombing in the right lung.  Some associated traction major airways and architectural distortion again noted.  Overall, the appearance is unchanged since  October, and no superimposed acute pulmonary  opacity is identified.  Mediastinal lipomatosis.  No pericardial effusion.  Coronary artery calcified atherosclerosis.  No mediastinal lymphadenopathy.  No pleural effusion.  Stable elevation of the right hemidiaphragm. Visible aorta and great vessels are patent.  Stable visualized upper abdominal viscera.  No acute osseous abnormality identified.  IMPRESSION: 1.  Pulmonary fibrosis unchanged since October. No superimposed acute findings are identified. 2.  Atherosclerosis.   Original Report Authenticated By: Erskine Speed, M.D.     Medications:  Prior to Admission:  Prescriptions prior to admission  Medication Sig Dispense Refill  . albuterol (PROVENTIL) (2.5 MG/3ML) 0.083% nebulizer solution Take 2.5 mg by nebulization every 6 (six) hours as needed.      . Cholecalciferol (VITAMIN D) 2000 UNITS CAPS Take 1 capsule by mouth daily.        . famotidine (PEPCID) 20 MG tablet One at bedtime  30 tablet  11  . levothyroxine (SYNTHROID, LEVOTHROID) 125 MCG tablet 125 mcg. Take 1 tablet daily except on Sat and Sun take 1/2 tablet       . levothyroxine (SYNTHROID, LEVOTHROID) 25 MCG tablet Take 1 every mon, wed, and fri       . Mometasone Furo-Formoterol Fum (DULERA) 200-5 MCG/ACT AERO Inhale 2 puffs into the lungs 2 (two) times daily.  1 Inhaler  11  . omeprazole (PRILOSEC) 20 MG capsule Take 20 mg by mouth 2 (two) times daily before a meal.        . predniSONE (DELTASONE) 20 MG tablet Take 1 tablet (20 mg total) by mouth 2 (two) times daily.  60 tablet  5  . simvastatin (ZOCOR) 80 MG tablet Take 80 mg by mouth at bedtime.        Marland Kitchen terazosin (HYTRIN) 10 MG capsule Take 10 mg by mouth at bedtime.       . vitamin C (ASCORBIC ACID) 500 MG tablet Take 1 tablet (500 mg total) by mouth daily.  90 tablet  0   Scheduled:   . albuterol  2.5 mg Nebulization Q4H  . antiseptic oral rinse  15 mL Mouth Rinse q12n4p  . atorvastatin  40 mg Oral q1800  . chlorhexidine  15 mL Mouth Rinse BID  . enoxaparin (LOVENOX)  injection  40 mg Subcutaneous Q24H  . guaiFENesin  600 mg Oral BID  . insulin aspart  0-20 Units Subcutaneous TID WC  . insulin aspart  0-5 Units Subcutaneous QHS  . ipratropium  0.5 mg Nebulization Q4H  . levofloxacin (LEVAQUIN) IV  500 mg Intravenous Q24H  . levothyroxine  125 mcg Oral QAC breakfast  . methylPREDNISolone (SOLU-MEDROL) injection  125 mg Intravenous Q6H  . mometasone-formoterol  2 puff Inhalation BID  . pantoprazole  40 mg Oral Q1200  . sodium chloride  3 mL Intravenous Q12H  . terazosin  10 mg Oral QHS   Continuous:  AVW:UJWJXB chloride, acetaminophen, acetaminophen, ondansetron (ZOFRAN) IV, ondansetron, sodium chloride, traZODone  Assesment: He has acute respiratory failure. He has interstitial lung disease which looks unchanged on CT. He does not have pneumonia but may have bronchitis and is being treated for that. His blood sugar has been up and is probably related to steroids and his acute illness. Active Problems:  * No active hospital problems. *     Plan: I'm going to check him for influenza since he became acutely ill. Continue with all his other treatments.    LOS: 1 day   Cottrell Gentles L 03/28/2012, 8:59 AM

## 2012-03-28 NOTE — Progress Notes (Signed)
UR Chart Review Completed  

## 2012-03-28 NOTE — Care Management Note (Signed)
    Page 1 of 1   03/30/2012     11:48:16 AM   CARE MANAGEMENT NOTE 03/30/2012  Patient:  Raymond [name], Raymond [name]   Account Number:  [id number]  Date Initiated:  03/28/2012  Documentation initiated by:  Rosemary Holms  Subjective/Objective Assessment:   Pt admitted from home where he lives with his wife. No anticipated needs per pt and spouse.     Action/Plan:   Anticipated DC Date:  03/30/2012   Anticipated DC Plan:  HOME/SELF CARE         Choice offered to / List presented to:             Status of service:  Completed, signed off Medicare Important Message given?  YES (If response is "NO", the following Medicare IM given date fields will be blank) Date Medicare IM given:  03/30/2012 Date Additional Medicare IM given:    Discharge Disposition:  HOME/SELF CARE  Per UR Regulation:    If discussed at Long Length of Stay Meetings, dates discussed:    Comments:  03/30/12 1147 Arlyss Queen, RN BSN CM Pt potential discharge today. Pt has O2 with [employer]. No other CM or HH needs noted.  03/28/12 Rosemary Holms RN BSN CM

## 2012-03-28 NOTE — Progress Notes (Signed)
*  PRELIMINARY RESULTS* Echocardiogram 2D Echocardiogram has been performed.  Conrad [place] 03/28/2012, 9:57 AM

## 2012-03-28 NOTE — Progress Notes (Signed)
Patient with lab results positive Flu A, and H1N1 detected. Dr. Juanetta Gosling notified. New orders for Tamiflu 75mg  BID.

## 2012-03-29 LAB — GLUCOSE, CAPILLARY
Glucose-Capillary: 182 mg/dL — ABNORMAL HIGH (ref 70–99)
Glucose-Capillary: 185 mg/dL — ABNORMAL HIGH (ref 70–99)

## 2012-03-29 MED ORDER — METHYLPREDNISOLONE SODIUM SUCC 40 MG IJ SOLR
40.0000 mg | Freq: Four times a day (QID) | INTRAMUSCULAR | Status: DC
  Administered 2012-03-29 – 2012-03-30 (×4): 40 mg via INTRAVENOUS
  Filled 2012-03-29 (×3): qty 1

## 2012-03-29 NOTE — Progress Notes (Signed)
Subjective: He says he feels better. He did not sleep last night. He has no other new complaints  Objective: Vital signs in last 24 hours: Temp:  [97.2 F (36.2 C)-98 F (36.7 C)] 97.9 F (36.6 C) (01/01 0630) Pulse Rate:  [77-84] 84  (01/01 0630) Resp:  [18-20] 20  (01/01 0630) BP: (113-130)/(58-85) 130/85 mmHg (01/01 0630) SpO2:  [94 %-98 %] 97 % (01/01 1043) Weight change:  Last BM Date: 03/28/12  Intake/Output from previous day: 12/31 0701 - 01/01 0700 In: 240 [P.O.:240] Out: 300 [Urine:300]  PHYSICAL EXAM General appearance: alert, cooperative and mild distress Resp: rales bibasilar Cardio: regular rate and rhythm, S1, S2 normal, no murmur, click, rub or gallop GI: soft, non-tender; bowel sounds normal; no masses,  no organomegaly Extremities: extremities normal, atraumatic, no cyanosis or edema  Lab Results:    Basic Metabolic Panel:  Basename 03/27/12 1617  NA 136  K 3.9  CL 96  CO2 31  GLUCOSE 206*  BUN 14  CREATININE 0.85  CALCIUM 9.0  MG --  PHOS --   Liver Function Tests: No results found for this basename: AST:2,ALT:2,ALKPHOS:2,BILITOT:2,PROT:2,ALBUMIN:2 in the last 72 hours No results found for this basename: LIPASE:2,AMYLASE:2 in the last 72 hours No results found for this basename: AMMONIA:2 in the last 72 hours CBC:  Basename 03/27/12 1617  WBC 9.4  NEUTROABS 8.7*  HGB 13.1  HCT 40.0  MCV 90.9  PLT 160   Cardiac Enzymes: No results found for this basename: CKTOTAL:3,CKMB:3,CKMBINDEX:3,TROPONINI:3 in the last 72 hours BNP: No results found for this basename: PROBNP:3 in the last 72 hours D-Dimer: No results found for this basename: DDIMER:2 in the last 72 hours CBG:  Basename 03/28/12 2155 03/28/12 1654 03/28/12 1136 03/28/12 0735 03/27/12 2110  GLUCAP 123* 256* 178* 179* 216*   Hemoglobin A1C:  Basename 03/27/12 1617  HGBA1C 6.8*   Fasting Lipid Panel: No results found for this basename:  CHOL,HDL,LDLCALC,TRIG,CHOLHDL,LDLDIRECT in the last 72 hours Thyroid Function Tests: No results found for this basename: TSH,T4TOTAL,FREET4,T3FREE,THYROIDAB in the last 72 hours Anemia Panel: No results found for this basename: VITAMINB12,FOLATE,FERRITIN,TIBC,IRON,RETICCTPCT in the last 72 hours Coagulation: No results found for this basename: LABPROT:2,INR:2 in the last 72 hours Urine Drug Screen: Drugs of Abuse  No results found for this basename: labopia, cocainscrnur, labbenz, amphetmu, thcu, labbarb    Alcohol Level: No results found for this basename: ETH:2 in the last 72 hours Urinalysis: No results found for this basename: COLORURINE:2,APPERANCEUR:2,LABSPEC:2,PHURINE:2,GLUCOSEU:2,HGBUR:2,BILIRUBINUR:2,KETONESUR:2,PROTEINUR:2,UROBILINOGEN:2,NITRITE:2,LEUKOCYTESUR:2 in the last 72 hours Misc. Labs:  ABGS No results found for this basename: PHART,PCO2,PO2ART,TCO2,HCO3 in the last 72 hours CULTURES Recent Results (from the past 240 hour(s))  CULTURE, EXPECTORATED SPUTUM-ASSESSMENT     Status: Normal   Collection Time   03/28/12  9:52 AM      Component Value Range Status Comment   Specimen Description SPUTUM EXPECTORATED   Final    Special Requests NONE   Final    Sputum evaluation     Final    Value: THIS SPECIMEN IS ACCEPTABLE. RESPIRATORY CULTURE REPORT TO FOLLOW.     Performed at [facility]   Report Status 03/28/2012 FINAL   Final   CULTURE, RESPIRATORY     Status: Normal (Preliminary result)   Collection Time   03/28/12  9:52 AM      Component Value Range Status Comment   Specimen Description SPUTUM EXPECTORATED   Final    Special Requests NONE   Final    Gram Stain  Final    Value: ABUNDANT WBC PRESENT, PREDOMINANTLY PMN     FEW SQUAMOUS EPITHELIAL CELLS PRESENT     ABUNDANT GRAM POSITIVE COCCI IN PAIRS     IN CLUSTERS IN CHAINS MODERATE GRAM POSITIVE RODS     FEW GRAM NEGATIVE RODS   Culture Culture reincubated for better growth   Final    Report  Status PENDING   Incomplete    Studies/Results: Ct Chest W Contrast  03/27/2012  *RADIOLOGY REPORT*  Clinical Data: 79 year old male with pulmonary fibrosis. Productive cough.  Severe shortness of breath.  CT CHEST WITH CONTRAST  Technique:  Multidetector CT imaging of the chest was performed following the standard protocol during bolus administration of intravenous contrast.  Contrast: 80mL OMNIPAQUE IOHEXOL 300 MG/ML  SOLN  Comparison: 01/10/2012 and earlier.  Findings: Major airways are stable.  There is some tracheomegaly again noted.  There is perihilar bronchiectasis and peribronchial thickening.  There is extensive peripheral reticular opacity with honeycombing in the right lung.  Some associated traction major airways and architectural distortion again noted.  Overall, the appearance is unchanged since October, and no superimposed acute pulmonary opacity is identified.  Mediastinal lipomatosis.  No pericardial effusion.  Coronary artery calcified atherosclerosis.  No mediastinal lymphadenopathy.  No pleural effusion.  Stable elevation of the right hemidiaphragm. Visible aorta and great vessels are patent.  Stable visualized upper abdominal viscera.  No acute osseous abnormality identified.  IMPRESSION: 1.  Pulmonary fibrosis unchanged since October. No superimposed acute findings are identified. 2.  Atherosclerosis.   Original Report Authenticated By: Erskine Speed, M.D.     Medications:  Prior to Admission:  Prescriptions prior to admission  Medication Sig Dispense Refill  . albuterol (PROVENTIL) (2.5 MG/3ML) 0.083% nebulizer solution Take 2.5 mg by nebulization every 6 (six) hours as needed.      Marland Kitchen aspirin 325 MG tablet Take 325 mg by mouth daily.      . Cholecalciferol (VITAMIN D) 2000 UNITS CAPS Take 2,000-4,000 Units by mouth daily. Takes 2000 units everyday except for Fri, Sat, and Sun patient takes 4000 units.      Marland Kitchen levothyroxine (SYNTHROID, LEVOTHROID) 125 MCG tablet Take 125 mcg by  mouth daily. Takes 125 mg every day except for Sunday, takes 62.5 mg.      . levothyroxine (SYNTHROID, LEVOTHROID) 25 MCG tablet Take 1 every mon, wed, and fri       . omeprazole (PRILOSEC) 20 MG capsule Take 20 mg by mouth 2 (two) times daily before a meal.        . predniSONE (DELTASONE) 20 MG tablet Take 10-20 mg by mouth daily. Alternates between taking 20 mg and 10 mg.      . simvastatin (ZOCOR) 80 MG tablet Take 80 mg by mouth at bedtime.        Marland Kitchen terazosin (HYTRIN) 10 MG capsule Take 10 mg by mouth at bedtime.        Scheduled:   . albuterol  2.5 mg Nebulization Q4H  . antiseptic oral rinse  15 mL Mouth Rinse q12n4p  . atorvastatin  40 mg Oral q1800  . chlorhexidine  15 mL Mouth Rinse BID  . enoxaparin (LOVENOX) injection  40 mg Subcutaneous Q24H  . guaiFENesin  600 mg Oral BID  . insulin aspart  0-20 Units Subcutaneous TID WC  . insulin aspart  0-5 Units Subcutaneous QHS  . ipratropium  0.5 mg Nebulization Q4H  . levofloxacin (LEVAQUIN) IV  500 mg Intravenous Q24H  .  levothyroxine  125 mcg Oral QAC breakfast  . methylPREDNISolone (SOLU-MEDROL) injection  40 mg Intravenous Q6H  . mometasone-formoterol  2 puff Inhalation BID  . oseltamivir  75 mg Oral BID  . pantoprazole  40 mg Oral Q1200  . sodium chloride  3 mL Intravenous Q12H  . terazosin  10 mg Oral QHS   Continuous:  HYQ:MVHQIO chloride, acetaminophen, acetaminophen, ondansetron (ZOFRAN) IV, ondansetron, sodium chloride, traZODone  Assesment: He has influenza which is caused him to have an exacerbation of his chronic interstitial lung disease. His blood sugar has been up probably from steroids. He is improving. Active Problems:  * No active hospital problems. *     Plan: Decrease steroid dose. He will be reevaluated tomorrow possibly go home tomorrow or the next day    LOS: 2 days   Raymond [name] 03/29/2012, 11:18 AM

## 2012-03-30 LAB — CULTURE, RESPIRATORY W GRAM STAIN: Culture: NORMAL

## 2012-03-30 LAB — GLUCOSE, CAPILLARY: Glucose-Capillary: 157 mg/dL — ABNORMAL HIGH (ref 70–99)

## 2012-03-30 MED ORDER — OSELTAMIVIR PHOSPHATE 75 MG PO CAPS: 75.0000 mg | ORAL_CAPSULE | Freq: Two times a day (BID) | ORAL | Status: DC

## 2012-03-30 MED ORDER — PREDNISONE 20 MG PO TABS: 50.0000 mg | ORAL_TABLET | Freq: Every day | ORAL | Status: DC

## 2012-03-30 MED ORDER — FLUTICASONE-SALMETEROL 250-50 MCG/DOSE IN AEPB: 1.0000 | INHALATION_SPRAY | Freq: Two times a day (BID) | RESPIRATORY_TRACT | Status: DC

## 2012-03-30 MED ORDER — PREDNISONE 20 MG PO TABS
50.0000 mg | ORAL_TABLET | Freq: Every day | ORAL | Status: DC
  Administered 2012-03-30: 50 mg via ORAL
  Filled 2012-03-30: qty 1

## 2012-03-30 NOTE — Progress Notes (Signed)
Pt ambulated in halls tolerated without SOB

## 2012-03-30 NOTE — Plan of Care (Signed)
Problem: Discharge Progression Outcomes Goal: Dyspnea controlled Outcome: Completed/Met Date Met:  03/30/12 Ambulatory  Goal: O2 sats > or equal 90% or at baseline Outcome: Completed/Met Date Met:  03/30/12 96% with o2  Pt uses O2 at home Goal: Other Discharge Outcomes/Goals Outcome: Completed/Met Date Met:  03/30/12 Pt family spouse in room for discharge instructiions

## 2012-03-30 NOTE — Plan of Care (Signed)
Problem: Discharge Progression Outcomes Goal: Other Discharge Outcomes/Goals Outcome: Completed/Met Date Met:  03/30/12 Discharged to home with spouse

## 2012-03-31 NOTE — Discharge Summary (Signed)
Physician Discharge Summary  Patient ID: Raymond [name] MRN: [id number] DOB/AGE: 05-31-1929 79 y.o. Primary Care Physician:MOORE, DONALD, MD Admit date: 03/27/2012 Discharge date: 03/31/2012    Discharge Diagnoses:  Influenza A., H1N1 Active Problems:  * No active hospital problems. *   exacerbation of chronic interstitial lung disease secondary to #1 Hypothyroidism GERD Steroid-induced hyperglycemia BPH Hyperlipidemia    Medication List     As of 03/31/2012  9:01 PM    TAKE these medications         albuterol (2.5 MG/3ML) 0.083% nebulizer solution   Commonly known as: PROVENTIL   Take 2.5 mg by nebulization every 6 (six) hours as needed.      aspirin 325 MG tablet   Take 325 mg by mouth daily.      Fluticasone-Salmeterol 250-50 MCG/DOSE Aepb   Commonly known as: ADVAIR   Inhale 1 puff into the lungs 2 (two) times daily.      levothyroxine 25 MCG tablet   Commonly known as: SYNTHROID, LEVOTHROID   Take 1 every mon, wed, and fri      levothyroxine 125 MCG tablet   Commonly known as: SYNTHROID, LEVOTHROID   Take 125 mcg by mouth daily. Takes 125 mg every day except for Sunday, takes 62.5 mg.      omeprazole 20 MG capsule   Commonly known as: PRILOSEC   Take 20 mg by mouth 2 (two) times daily before a meal.      oseltamivir 75 MG capsule   Commonly known as: TAMIFLU   Take 1 capsule (75 mg total) by mouth 2 (two) times daily.      oseltamivir 75 MG capsule   Commonly known as: TAMIFLU   Take 1 capsule (75 mg total) by mouth 2 (two) times daily.      predniSONE 20 MG tablet   Commonly known as: DELTASONE   Take 10-20 mg by mouth daily. Alternates between taking 20 mg and 10 mg.      simvastatin 80 MG tablet   Commonly known as: ZOCOR   Take 80 mg by mouth at bedtime.      terazosin 10 MG capsule   Commonly known as: HYTRIN   Take 10 mg by mouth at bedtime.      Vitamin D 2000 UNITS Caps   Take 2,000-4,000 Units by mouth daily. Takes 2000 units  everyday except for Fri, Sat, and Sun patient takes 4000 units.        Discharged Condition: Improved    Consults: None  Significant Diagnostic Studies: Ct Chest W Contrast  03/27/2012  *RADIOLOGY REPORT*  Clinical Data: 79 year old male with pulmonary fibrosis. Productive cough.  Severe shortness of breath.  CT CHEST WITH CONTRAST  Technique:  Multidetector CT imaging of the chest was performed following the standard protocol during bolus administration of intravenous contrast.  Contrast: 80mL OMNIPAQUE IOHEXOL 300 MG/ML  SOLN  Comparison: 01/10/2012 and earlier.  Findings: Major airways are stable.  There is some tracheomegaly again noted.  There is perihilar bronchiectasis and peribronchial thickening.  There is extensive peripheral reticular opacity with honeycombing in the right lung.  Some associated traction major airways and architectural distortion again noted.  Overall, the appearance is unchanged since October, and no superimposed acute pulmonary opacity is identified.  Mediastinal lipomatosis.  No pericardial effusion.  Coronary artery calcified atherosclerosis.  No mediastinal lymphadenopathy.  No pleural effusion.  Stable elevation of the right hemidiaphragm. Visible aorta and great vessels are patent.  Stable visualized upper  abdominal viscera.  No acute osseous abnormality identified.  IMPRESSION: 1.  Pulmonary fibrosis unchanged since October. No superimposed acute findings are identified. 2.  Atherosclerosis.   Original Report Authenticated By: Erskine Speed, M.D.     Lab Results: Basic Metabolic Panel: No results found for this basename: NA:2,K:2,CL:2,CO2:2,GLUCOSE:2,BUN:2,CREATININE:2,CALCIUM:2,MG:2,PHOS:2 in the last 72 hours Liver Function Tests: No results found for this basename: AST:2,ALT:2,ALKPHOS:2,BILITOT:2,PROT:2,ALBUMIN:2 in the last 72 hours   CBC: No results found for this basename: WBC:2,NEUTROABS:2,HGB:2,HCT:2,MCV:2,PLT:2 in the last 72 hours  Recent Results  (from the past 240 hour(s))  CULTURE, EXPECTORATED SPUTUM-ASSESSMENT     Status: Normal   Collection Time   03/28/12  9:52 AM      Component Value Range Status Comment   Specimen Description SPUTUM EXPECTORATED   Final    Special Requests NONE   Final    Sputum evaluation     Final    Value: THIS SPECIMEN IS ACCEPTABLE. RESPIRATORY CULTURE REPORT TO FOLLOW.     Performed at [facility]   Report Status 03/28/2012 FINAL   Final   CULTURE, RESPIRATORY     Status: Normal   Collection Time   03/28/12  9:52 AM      Component Value Range Status Comment   Specimen Description SPUTUM EXPECTORATED   Final    Special Requests NONE   Final    Gram Stain     Final    Value: ABUNDANT WBC PRESENT, PREDOMINANTLY PMN     FEW SQUAMOUS EPITHELIAL CELLS PRESENT     ABUNDANT GRAM POSITIVE COCCI IN PAIRS     IN CLUSTERS IN CHAINS MODERATE GRAM POSITIVE RODS     FEW GRAM NEGATIVE RODS   Culture NORMAL OROPHARYNGEAL FLORA   Final    Report Status 03/30/2012 FINAL   Final      Hospital Course: He was sent to the hospital from his primary care physician's office where he was extremely short of breath. He had some aching and some cough. Chest x-ray did not show pneumonia. He was sent here because he was clearly in some respiratory distress. He was started on antibiotics and IV steroids. He had influenza markers done and was found to be positive for influenza A and it was felt that this was the cause of his exacerbation of his interstitial lung disease he improved over the next several days. He was found to be hyperglycemic and was felt this was due to steroids. He was back to baseline by the time of discharge  Discharge Exam: Blood pressure 146/76, pulse 78, temperature 97.6 F (36.4 C), temperature source Oral, resp. rate 19, height 5\' 6"  (1.676 m), weight 81.8 kg (180 lb 5.4 oz), SpO2 92.00%. He was awake and alert. His chest was much clearer. He was in no distress.  Disposition: Home he will  followup in my office on January 7 and may need treatment for his blood sugar but it is not clear that he will need that when he will be on a reduced dose of steroids. He will be referred to the [facility]      Discharge Orders    Future Orders Please Complete By Expires   Discharge patient           Signed: Fredirick Maudlin Pager [phone]  03/31/2012, 9:01 PM

## 2012-06-15 ENCOUNTER — Other Ambulatory Visit (HOSPITAL_COMMUNITY): Payer: Self-pay | Admitting: Family Medicine

## 2012-06-20 ENCOUNTER — Telehealth: Payer: Self-pay | Admitting: Family Medicine

## 2012-06-20 NOTE — Telephone Encounter (Signed)
Orders faxed over from physician at [facility] to our lab to have labwork drawn on patient before he starts him on a new medication.  Appt made for tomorrow morning and patient is aware.

## 2012-06-20 NOTE — Telephone Encounter (Signed)
Patient want's to know if he is suppose to come in this morning for the bloodwork and is he suppose to be fasting when he comes.

## 2012-06-21 ENCOUNTER — Other Ambulatory Visit (INDEPENDENT_AMBULATORY_CARE_PROVIDER_SITE_OTHER): Payer: Medicare Other

## 2012-06-21 ENCOUNTER — Other Ambulatory Visit: Payer: Self-pay | Admitting: Family Medicine

## 2012-06-21 DIAGNOSIS — Z79899 Other long term (current) drug therapy: Secondary | ICD-10-CM

## 2012-06-21 NOTE — Progress Notes (Signed)
Patient came in for labs but we had to send them out to [facility]. We will fax results to ordering doctor and have front office staff scan into epic.

## 2012-06-30 LAB — THIOPURINE METHYLTRANSFERASE (TPMT), RBC: Thiopurine Methyltransferase, RBC: 16

## 2012-07-01 ENCOUNTER — Telehealth: Payer: Self-pay | Admitting: *Deleted

## 2012-07-01 NOTE — Telephone Encounter (Signed)
Message copied by Baltazar Apo on Sat Jul 01, 2012 10:33 AM ------      Message from: Ernestina Penna      Created: Fri Jun 30, 2012  8:28 PM       Lab test is normal, send copy to physician that requested this, and call patient ------

## 2012-07-01 NOTE — Progress Notes (Signed)
PLEASE TAKE CARE OF THANKS  

## 2012-07-01 NOTE — Telephone Encounter (Signed)
PT NOTIFIED  

## 2012-07-10 ENCOUNTER — Other Ambulatory Visit (INDEPENDENT_AMBULATORY_CARE_PROVIDER_SITE_OTHER): Payer: Medicare Other

## 2012-07-10 DIAGNOSIS — J8410 Pulmonary fibrosis, unspecified: Secondary | ICD-10-CM

## 2012-07-10 DIAGNOSIS — Z79899 Other long term (current) drug therapy: Secondary | ICD-10-CM

## 2012-07-10 LAB — CBC WITH DIFFERENTIAL/PLATELET
Basophils Absolute: 0 10*3/uL (ref 0.0–0.1)
Basophils Relative: 0 % (ref 0–1)
Eosinophils Absolute: 0.1 10*3/uL (ref 0.0–0.7)
HCT: 41.2 % (ref 39.0–52.0)
Hemoglobin: 13.2 g/dL (ref 13.0–17.0)
MCH: 27.4 pg (ref 26.0–34.0)
MCHC: 32 g/dL (ref 30.0–36.0)
Monocytes Absolute: 0.4 10*3/uL (ref 0.1–1.0)
Monocytes Relative: 5 % (ref 3–12)
Neutro Abs: 6.2 10*3/uL (ref 1.7–7.7)
RDW: 15.3 % (ref 11.5–15.5)

## 2012-07-10 LAB — COMPREHENSIVE METABOLIC PANEL
AST: 19 U/L (ref 0–37)
Albumin: 3.5 g/dL (ref 3.5–5.2)
Alkaline Phosphatase: 61 U/L (ref 39–117)
BUN: 16 mg/dL (ref 6–23)
Creat: 0.76 mg/dL (ref 0.50–1.35)
Glucose, Bld: 149 mg/dL — ABNORMAL HIGH (ref 70–99)
Potassium: 4.1 mEq/L (ref 3.5–5.3)
Total Bilirubin: 0.7 mg/dL (ref 0.3–1.2)

## 2012-07-10 NOTE — Progress Notes (Signed)
Patient here today for labs only. Labs were ordered by doctor daniel gilstrap.

## 2012-07-13 ENCOUNTER — Encounter (HOSPITAL_COMMUNITY)
Admission: RE | Admit: 2012-07-13 | Discharge: 2012-07-13 | Disposition: A | Discharge status: Home / Self Care | Payer: Medicare Other | Source: Ambulatory Visit | Attending: Pulmonary Disease | Admitting: Pulmonary Disease

## 2012-07-13 ENCOUNTER — Encounter (HOSPITAL_COMMUNITY): Payer: Self-pay

## 2012-07-13 VITALS — BP 90/62 | HR 98 | Ht 65.0 in | Wt 178.0 lb

## 2012-07-13 DIAGNOSIS — J449 Chronic obstructive pulmonary disease, unspecified: Secondary | ICD-10-CM

## 2012-07-13 DIAGNOSIS — Z5189 Encounter for other specified aftercare: Secondary | ICD-10-CM | POA: Insufficient documentation

## 2012-07-13 DIAGNOSIS — J849 Interstitial pulmonary disease, unspecified: Secondary | ICD-10-CM

## 2012-07-13 DIAGNOSIS — J4489 Other specified chronic obstructive pulmonary disease: Secondary | ICD-10-CM | POA: Insufficient documentation

## 2012-07-13 NOTE — Patient Instructions (Signed)
Pt has finished orientation and is scheduled to start PR on 07/17/12 at 1 pm. Pt has been instructed to arrive to class 15 minutes early for scheduled class. Pt has been instructed to wear comfortable clothing and shoes with rubber soles. Pt has been told to take their medications 1 hour prior to coming to class.  If the patient is not going to attend class, he/she has been instructed to call.

## 2012-07-13 NOTE — Progress Notes (Signed)
Patient referred to [facility] by Dr. Shaune Pollack and Dr. Margaretha Seeds for COPD/ILD [phone]. During orientation advised patient on arrival and appointment times what to wear, what to do before, during and after exercise. Reviewed attendance and class policy. Talked about inclement weather and class consultation policy. Pt is scheduled to start Pulm Rehab on 07/17/12 at 1 pm. Pt was advised to come to class 5 minutes before class starts. He was also given instructions on meeting with the dietician and attending the Family Structure classes. Pt is eager to get started. Patient was given 6 minute pre-walk test. Discussed pursed-lip breathing.

## 2012-07-17 ENCOUNTER — Other Ambulatory Visit: Payer: Self-pay | Admitting: Family Medicine

## 2012-07-17 ENCOUNTER — Encounter (HOSPITAL_COMMUNITY)
Admission: RE | Admit: 2012-07-17 | Discharge: 2012-07-17 | Disposition: A | Discharge status: Home / Self Care | Payer: Medicare Other | Source: Ambulatory Visit | Attending: Pulmonary Disease | Admitting: Pulmonary Disease

## 2012-07-17 ENCOUNTER — Encounter (HOSPITAL_COMMUNITY): Payer: Self-pay | Admitting: *Deleted

## 2012-07-19 ENCOUNTER — Encounter (HOSPITAL_COMMUNITY)
Admission: RE | Admit: 2012-07-19 | Discharge: 2012-07-19 | Disposition: A | Discharge status: Home / Self Care | Payer: Medicare Other | Source: Ambulatory Visit | Attending: Pulmonary Disease | Admitting: Pulmonary Disease

## 2012-07-21 ENCOUNTER — Other Ambulatory Visit: Payer: Self-pay | Admitting: Family Medicine

## 2012-07-24 ENCOUNTER — Encounter (HOSPITAL_COMMUNITY)
Admission: RE | Admit: 2012-07-24 | Discharge: 2012-07-24 | Disposition: A | Discharge status: Home / Self Care | Payer: Medicare Other | Source: Ambulatory Visit | Attending: Pulmonary Disease | Admitting: Pulmonary Disease

## 2012-07-24 ENCOUNTER — Other Ambulatory Visit (INDEPENDENT_AMBULATORY_CARE_PROVIDER_SITE_OTHER): Payer: Medicare Other

## 2012-07-24 DIAGNOSIS — Z79899 Other long term (current) drug therapy: Secondary | ICD-10-CM

## 2012-07-24 LAB — COMPREHENSIVE METABOLIC PANEL
Albumin: 3.6 g/dL (ref 3.5–5.2)
BUN: 18 mg/dL (ref 6–23)
CO2: 30 mEq/L (ref 19–32)
Glucose, Bld: 134 mg/dL — ABNORMAL HIGH (ref 70–99)
Sodium: 140 mEq/L (ref 135–145)
Total Bilirubin: 0.8 mg/dL (ref 0.3–1.2)
Total Protein: 6.2 g/dL (ref 6.0–8.3)

## 2012-07-24 LAB — CBC WITH DIFFERENTIAL/PLATELET
Basophils Relative: 0 % (ref 0–1)
Eosinophils Absolute: 0.1 10*3/uL (ref 0.0–0.7)
HCT: 40.6 % (ref 39.0–52.0)
Hemoglobin: 13.4 g/dL (ref 13.0–17.0)
Lymphs Abs: 1.4 10*3/uL (ref 0.7–4.0)
MCH: 27.7 pg (ref 26.0–34.0)
MCHC: 33 g/dL (ref 30.0–36.0)
MCV: 83.9 fL (ref 78.0–100.0)
Monocytes Absolute: 0.5 10*3/uL (ref 0.1–1.0)
Monocytes Relative: 6 % (ref 3–12)
Neutrophils Relative %: 75 % (ref 43–77)
RBC: 4.84 MIL/uL (ref 4.22–5.81)

## 2012-07-24 NOTE — Progress Notes (Signed)
Patient came in for labs only.

## 2012-07-26 ENCOUNTER — Encounter (HOSPITAL_COMMUNITY): Payer: Medicare Other

## 2012-07-31 ENCOUNTER — Encounter (HOSPITAL_COMMUNITY)
Admission: RE | Admit: 2012-07-31 | Discharge: 2012-07-31 | Disposition: A | Discharge status: Home / Self Care | Payer: Medicare Other | Source: Ambulatory Visit | Attending: Pulmonary Disease | Admitting: Pulmonary Disease

## 2012-07-31 DIAGNOSIS — J4489 Other specified chronic obstructive pulmonary disease: Secondary | ICD-10-CM | POA: Insufficient documentation

## 2012-07-31 DIAGNOSIS — Z5189 Encounter for other specified aftercare: Secondary | ICD-10-CM | POA: Insufficient documentation

## 2012-07-31 DIAGNOSIS — J449 Chronic obstructive pulmonary disease, unspecified: Secondary | ICD-10-CM | POA: Insufficient documentation

## 2012-08-02 ENCOUNTER — Encounter (HOSPITAL_COMMUNITY)
Admission: RE | Admit: 2012-08-02 | Discharge: 2012-08-02 | Disposition: A | Discharge status: Home / Self Care | Payer: Medicare Other | Source: Ambulatory Visit | Attending: Pulmonary Disease | Admitting: Pulmonary Disease

## 2012-08-07 ENCOUNTER — Other Ambulatory Visit: Payer: Self-pay

## 2012-08-07 ENCOUNTER — Encounter (HOSPITAL_COMMUNITY)
Admission: RE | Admit: 2012-08-07 | Discharge: 2012-08-07 | Disposition: A | Discharge status: Home / Self Care | Payer: Medicare Other | Source: Ambulatory Visit | Attending: Pulmonary Disease | Admitting: Pulmonary Disease

## 2012-08-09 ENCOUNTER — Encounter (HOSPITAL_COMMUNITY)
Admission: RE | Admit: 2012-08-09 | Discharge: 2012-08-09 | Disposition: A | Discharge status: Home / Self Care | Payer: Medicare Other | Source: Ambulatory Visit | Attending: Pulmonary Disease | Admitting: Pulmonary Disease

## 2012-08-14 ENCOUNTER — Encounter (HOSPITAL_COMMUNITY)
Admission: RE | Admit: 2012-08-14 | Discharge: 2012-08-14 | Disposition: A | Discharge status: Home / Self Care | Payer: Medicare Other | Source: Ambulatory Visit | Attending: Pulmonary Disease | Admitting: Pulmonary Disease

## 2012-08-16 ENCOUNTER — Encounter (HOSPITAL_COMMUNITY)
Admission: RE | Admit: 2012-08-16 | Discharge: 2012-08-16 | Disposition: A | Discharge status: Home / Self Care | Payer: Medicare Other | Source: Ambulatory Visit | Attending: Pulmonary Disease | Admitting: Pulmonary Disease

## 2012-08-21 ENCOUNTER — Encounter (HOSPITAL_COMMUNITY): Payer: Medicare Other

## 2012-08-22 ENCOUNTER — Other Ambulatory Visit: Payer: Self-pay

## 2012-08-23 ENCOUNTER — Encounter (HOSPITAL_COMMUNITY): Payer: Medicare Other

## 2012-08-23 ENCOUNTER — Other Ambulatory Visit (INDEPENDENT_AMBULATORY_CARE_PROVIDER_SITE_OTHER): Payer: Medicare Other

## 2012-08-23 DIAGNOSIS — Z79899 Other long term (current) drug therapy: Secondary | ICD-10-CM

## 2012-08-23 LAB — CBC WITH DIFFERENTIAL/PLATELET
Hemoglobin: 13.1 g/dL (ref 13.0–17.0)
Lymphocytes Relative: 19 % (ref 12–46)
Lymphs Abs: 1.4 10*3/uL (ref 0.7–4.0)
MCV: 85.2 fL (ref 78.0–100.0)
Monocytes Relative: 6 % (ref 3–12)
Neutrophils Relative %: 74 % (ref 43–77)
Platelets: 241 10*3/uL (ref 150–400)
RBC: 4.67 MIL/uL (ref 4.22–5.81)
WBC: 7.4 10*3/uL (ref 4.0–10.5)

## 2012-08-23 LAB — COMPLETE METABOLIC PANEL WITH GFR
ALT: 24 U/L (ref 0–53)
Albumin: 3.7 g/dL (ref 3.5–5.2)
CO2: 31 mEq/L (ref 19–32)
Calcium: 9.3 mg/dL (ref 8.4–10.5)
Chloride: 99 mEq/L (ref 96–112)
GFR, Est African American: >89 mL/min
Sodium: 139 mEq/L (ref 135–145)
Total Protein: 6.7 g/dL (ref 6.0–8.3)

## 2012-08-28 ENCOUNTER — Encounter (HOSPITAL_COMMUNITY)
Admission: RE | Admit: 2012-08-28 | Discharge: 2012-08-28 | Disposition: A | Discharge status: Home / Self Care | Payer: Medicare Other | Source: Ambulatory Visit | Attending: Pulmonary Disease | Admitting: Pulmonary Disease

## 2012-08-28 DIAGNOSIS — J449 Chronic obstructive pulmonary disease, unspecified: Secondary | ICD-10-CM | POA: Insufficient documentation

## 2012-08-28 DIAGNOSIS — Z5189 Encounter for other specified aftercare: Secondary | ICD-10-CM | POA: Insufficient documentation

## 2012-08-28 DIAGNOSIS — J4489 Other specified chronic obstructive pulmonary disease: Secondary | ICD-10-CM | POA: Insufficient documentation

## 2012-08-30 ENCOUNTER — Encounter (HOSPITAL_COMMUNITY)
Admission: RE | Admit: 2012-08-30 | Discharge: 2012-08-30 | Disposition: A | Discharge status: Home / Self Care | Payer: Medicare Other | Source: Ambulatory Visit | Attending: Pulmonary Disease | Admitting: Pulmonary Disease

## 2012-09-04 ENCOUNTER — Encounter (HOSPITAL_COMMUNITY)
Admission: RE | Admit: 2012-09-04 | Discharge: 2012-09-04 | Disposition: A | Discharge status: Home / Self Care | Payer: Medicare Other | Source: Ambulatory Visit | Attending: Pulmonary Disease | Admitting: Pulmonary Disease

## 2012-09-04 ENCOUNTER — Ambulatory Visit (INDEPENDENT_AMBULATORY_CARE_PROVIDER_SITE_OTHER): Payer: Medicare Other | Admitting: Family Medicine

## 2012-09-04 ENCOUNTER — Encounter: Payer: Self-pay | Admitting: Family Medicine

## 2012-09-04 VITALS — BP 125/77 | HR 79 | Temp 97.2°F | Ht 65.0 in | Wt 179.8 lb

## 2012-09-04 DIAGNOSIS — E039 Hypothyroidism, unspecified: Secondary | ICD-10-CM

## 2012-09-04 DIAGNOSIS — R5383 Other fatigue: Secondary | ICD-10-CM

## 2012-09-04 DIAGNOSIS — I2510 Atherosclerotic heart disease of native coronary artery without angina pectoris: Secondary | ICD-10-CM

## 2012-09-04 DIAGNOSIS — R05 Cough: Secondary | ICD-10-CM

## 2012-09-04 DIAGNOSIS — R5381 Other malaise: Secondary | ICD-10-CM

## 2012-09-04 DIAGNOSIS — E785 Hyperlipidemia, unspecified: Secondary | ICD-10-CM

## 2012-09-04 DIAGNOSIS — J309 Allergic rhinitis, unspecified: Secondary | ICD-10-CM

## 2012-09-04 DIAGNOSIS — N400 Enlarged prostate without lower urinary tract symptoms: Secondary | ICD-10-CM | POA: Insufficient documentation

## 2012-09-04 DIAGNOSIS — E559 Vitamin D deficiency, unspecified: Secondary | ICD-10-CM

## 2012-09-04 DIAGNOSIS — J8410 Pulmonary fibrosis, unspecified: Secondary | ICD-10-CM

## 2012-09-04 LAB — POCT CBC
Granulocyte percent: 80.7 %G — AB (ref 37–80)
Lymph, poc: 1.7 (ref 0.6–3.4)
MCH, POC: 29 pg (ref 27–31.2)
MPV: 8.9 fL (ref 0–99.8)
POC Granulocyte: 7.2 — AB (ref 2–6.9)
POC LYMPH PERCENT: 18.7 %L (ref 10–50)
Platelet Count, POC: 208 10*3/uL (ref 142–424)
RBC: 4.8 M/uL (ref 4.69–6.13)

## 2012-09-04 LAB — BASIC METABOLIC PANEL WITH GFR
BUN: 13 mg/dL (ref 6–23)
Chloride: 101 mEq/L (ref 96–112)
GFR, Est Non African American: >89 mL/min
Potassium: 4.2 mEq/L (ref 3.5–5.3)

## 2012-09-04 LAB — HEPATIC FUNCTION PANEL
Indirect Bilirubin: 0.5 mg/dL (ref 0.0–0.9)
Total Protein: 6.4 g/dL (ref 6.0–8.3)

## 2012-09-04 LAB — THYROID PANEL WITH TSH: Free Thyroxine Index: 4.9 — ABNORMAL HIGH (ref 1.0–3.9)

## 2012-09-04 NOTE — Progress Notes (Addendum)
  Subjective:    Patient ID: Raymond [name], male    DOB: Mar 08, 1930, 79 y.o.   MRN: [id number]  HPI This patient presents for recheck of multiple medical problems. No one accompanies the patient today.  Patient Active Problem List   Diagnosis Date Noted  . BPH (benign prostatic hyperplasia) 09/04/2012  . Allergic rhinitis 09/24/2010  .  Cough, chronic 06/26/2010  . PULMONARY FIBROSIS ILD POST INFLAMMATORY CHRONIC 12/30/2009  . HYPOTHYROIDISM 12/29/2009  . HYPERLIPIDEMIA 12/29/2009  . CORONARY HEART DISEASE 12/29/2009    In addition, patient has been getting pulmonary rehabilitation for his pulmonary fibrosis. This was recommended by Dr. Juanetta Gosling.  The allergies, current medications, past medical history, surgical history, family and social history are reviewed.  Immunizations reviewed.  Health maintenance reviewed.  The following items are outstanding: None      Review of Systems  Constitutional: Positive for fatigue.  HENT: Positive for congestion (slight).   Eyes: Positive for discharge (watery).  Respiratory: Positive for cough (slightly prod, clear), shortness of breath (slightly increased) and wheezing. Negative for chest tightness.   Cardiovascular: Positive for palpitations (some). Negative for chest pain.  Gastrointestinal: Negative.   Endocrine: Negative.   Genitourinary: Negative.   Musculoskeletal: Positive for back pain (LBP, DDD) and arthralgias (bilateral hip).  Skin: Negative.   Allergic/Immunologic: Negative.   Neurological: Positive for dizziness (when exerted). Negative for headaches.  Hematological: Bruises/bleeds easily.  Psychiatric/Behavioral: Negative.        Objective:   Physical Exam BP 125/77  Pulse 79  Temp(Src) 97.2 F (36.2 C) (Oral)  Ht 5\' 5"  (1.651 m)  Wt 179 lb 12.8 oz (81.557 kg)  BMI 29.92 kg/m2  The patient appeared well nourished and normally developed, alert and oriented to time and place. He has his portable oxygen  with him Speech, behavior and judgement appear normal. Vital signs as documented.  Head exam is unremarkable. No scleral icterus or pallor noted. There is some nasal congestion bilaterally right greater than left.. TMs are normal throat is normal. Neck is without jugular venous distension, thyromegally, or carotid bruits. Carotid upstrokes are brisk bilaterally. No cervical adenopathy. Lungs have minimal congestion  anteriorly and posteriorly to auscultation. Normal respiratory effort. With coughing more congestion is apparent bilaterally. Cardiac exam reveals regular rate and rhythm at 84 per minute. First and second heart sounds normal.  No murmurs, rubs or gallops.  Abdominal exam reveals  no masses, no organomegaly and no aortic enlargement. No inguinal adenopathy. Rectal exam was done. Prostate was enlarged and smooth. There were no rectal masses. And there was no hernia. Extremities are nonedematous and both femoral pulses are normal. Skin without pallor or jaundice.  Warm and dry, without rash. Neurologic exam reveals normal deep tendon reflexes and normal sensation.          Assessment & Plan:  1. Hypothyroid  2. BPH (benign prostatic hyperplasia)  3. Allergic rhinitis  4.  Cough, chronic  5. CORONARY HEART DISEASE  6. HYPERLIPIDEMIA  7. HYPOTHYROIDISM  8. PULMONARY FIBROSIS ILD POST INFLAMMATORY CHRONIC  Labs will be drawn today. These will include lipids renal function and thyroid.  Patient Instructions  Fall prevention discussed Continue pulmonary rehabilitation as prescribed by Dr. Juanetta Gosling Continue current medications Continue followup with Dr. Juanetta Gosling and Dr. Glynda Jaeger at [facility]

## 2012-09-04 NOTE — Addendum Note (Signed)
Addended by: Bearl Mulberry on: 09/04/2012 10:06 AM   Modules accepted: Orders

## 2012-09-04 NOTE — Patient Instructions (Addendum)
Fall prevention discussed Continue pulmonary rehabilitation as prescribed by Dr. Juanetta Gosling Continue current medications Continue followup with Dr. Juanetta Gosling and Dr. Glynda Jaeger at [facility]

## 2012-09-04 NOTE — Addendum Note (Signed)
Addended by: Tommas Olp on: 09/04/2012 10:26 AM   Modules accepted: Orders

## 2012-09-05 ENCOUNTER — Other Ambulatory Visit (INDEPENDENT_AMBULATORY_CARE_PROVIDER_SITE_OTHER): Payer: Medicare Other

## 2012-09-05 DIAGNOSIS — Z1212 Encounter for screening for malignant neoplasm of rectum: Secondary | ICD-10-CM

## 2012-09-05 LAB — NMR LIPOPROFILE WITH LIPIDS
HDL Size: 10.7 nm (ref >=9.2)
HDL-C: 72 mg/dL (ref >=40)
LDL Particle Number: 828 nmol/L (ref <1000)
LP-IR Score: <25 (ref <=45)
Large HDL-P: 16.8 umol/L (ref >=4.8)
Triglycerides: 80 mg/dL (ref <150)
VLDL Size: 48.5 nm — ABNORMAL HIGH (ref <=46.6)

## 2012-09-05 LAB — VITAMIN D 25 HYDROXY (VIT D DEFICIENCY, FRACTURES): Vit D, 25-Hydroxy: 53 ng/mL (ref 30–89)

## 2012-09-05 NOTE — Progress Notes (Signed)
Patient dropped off fobt 

## 2012-09-06 ENCOUNTER — Encounter (HOSPITAL_COMMUNITY)
Admission: RE | Admit: 2012-09-06 | Discharge: 2012-09-06 | Disposition: A | Discharge status: Home / Self Care | Payer: Medicare Other | Source: Ambulatory Visit | Attending: Pulmonary Disease | Admitting: Pulmonary Disease

## 2012-09-06 LAB — FECAL OCCULT BLOOD, IMMUNOCHEMICAL: Fecal Occult Blood: NEGATIVE

## 2012-09-11 ENCOUNTER — Encounter (HOSPITAL_COMMUNITY)
Admission: RE | Admit: 2012-09-11 | Discharge: 2012-09-11 | Disposition: A | Discharge status: Home / Self Care | Payer: Medicare Other | Source: Ambulatory Visit | Attending: Pulmonary Disease | Admitting: Pulmonary Disease

## 2012-09-13 ENCOUNTER — Encounter (HOSPITAL_COMMUNITY)
Admission: RE | Admit: 2012-09-13 | Discharge: 2012-09-13 | Disposition: A | Discharge status: Home / Self Care | Payer: Medicare Other | Source: Ambulatory Visit | Attending: Pulmonary Disease | Admitting: Pulmonary Disease

## 2012-09-18 ENCOUNTER — Encounter (HOSPITAL_COMMUNITY)
Admission: RE | Admit: 2012-09-18 | Discharge: 2012-09-18 | Disposition: A | Discharge status: Home / Self Care | Payer: Medicare Other | Source: Ambulatory Visit | Attending: Pulmonary Disease | Admitting: Pulmonary Disease

## 2012-09-20 ENCOUNTER — Other Ambulatory Visit: Payer: Self-pay | Admitting: Family Medicine

## 2012-09-20 ENCOUNTER — Other Ambulatory Visit: Payer: Self-pay | Admitting: *Deleted

## 2012-09-20 ENCOUNTER — Encounter (HOSPITAL_COMMUNITY)
Admission: RE | Admit: 2012-09-20 | Discharge: 2012-09-20 | Disposition: A | Discharge status: Home / Self Care | Payer: Medicare Other | Source: Ambulatory Visit | Attending: Pulmonary Disease | Admitting: Pulmonary Disease

## 2012-09-20 MED ORDER — OMEPRAZOLE 20 MG PO CPDR: 20.0000 mg | DELAYED_RELEASE_CAPSULE | Freq: Two times a day (BID) | ORAL | Status: DC

## 2012-09-21 ENCOUNTER — Other Ambulatory Visit (HOSPITAL_COMMUNITY): Payer: Self-pay | Admitting: Family Medicine

## 2012-09-25 ENCOUNTER — Encounter (HOSPITAL_COMMUNITY)
Admission: RE | Admit: 2012-09-25 | Discharge: 2012-09-25 | Disposition: A | Discharge status: Home / Self Care | Payer: Medicare Other | Source: Ambulatory Visit | Attending: Pulmonary Disease | Admitting: Pulmonary Disease

## 2012-09-25 ENCOUNTER — Other Ambulatory Visit (INDEPENDENT_AMBULATORY_CARE_PROVIDER_SITE_OTHER): Payer: Medicare Other

## 2012-09-25 DIAGNOSIS — Z79899 Other long term (current) drug therapy: Secondary | ICD-10-CM

## 2012-09-25 LAB — CBC WITH DIFFERENTIAL/PLATELET
Eosinophils Relative: 1 % (ref 0–5)
HCT: 39.6 % (ref 39.0–52.0)
Hemoglobin: 12.9 g/dL — ABNORMAL LOW (ref 13.0–17.0)
Lymphocytes Relative: 16 % (ref 12–46)
Lymphs Abs: 1.4 10*3/uL (ref 0.7–4.0)
MCV: 85 fL (ref 78.0–100.0)
Monocytes Relative: 6 % (ref 3–12)
Platelets: 241 10*3/uL (ref 150–400)
RBC: 4.66 MIL/uL (ref 4.22–5.81)
WBC: 8.6 10*3/uL (ref 4.0–10.5)

## 2012-09-25 LAB — COMPREHENSIVE METABOLIC PANEL
ALT: 24 U/L (ref 0–53)
CO2: 29 mEq/L (ref 19–32)
Calcium: 9.3 mg/dL (ref 8.4–10.5)
Chloride: 100 mEq/L (ref 96–112)
Creat: 0.65 mg/dL (ref 0.50–1.35)
Total Protein: 6.3 g/dL (ref 6.0–8.3)

## 2012-09-25 NOTE — Progress Notes (Signed)
Pt for labs per dr.d.gilstrap,md [facility] fax [phone]

## 2012-09-27 ENCOUNTER — Encounter (HOSPITAL_COMMUNITY)
Admission: RE | Admit: 2012-09-27 | Discharge: 2012-09-27 | Disposition: A | Discharge status: Home / Self Care | Payer: Medicare Other | Source: Ambulatory Visit | Attending: Pulmonary Disease | Admitting: Pulmonary Disease

## 2012-09-27 DIAGNOSIS — J449 Chronic obstructive pulmonary disease, unspecified: Secondary | ICD-10-CM | POA: Insufficient documentation

## 2012-09-27 DIAGNOSIS — J4489 Other specified chronic obstructive pulmonary disease: Secondary | ICD-10-CM | POA: Insufficient documentation

## 2012-09-27 DIAGNOSIS — Z5189 Encounter for other specified aftercare: Secondary | ICD-10-CM | POA: Insufficient documentation

## 2012-09-28 ENCOUNTER — Telehealth: Payer: Self-pay | Admitting: *Deleted

## 2012-09-28 NOTE — Telephone Encounter (Signed)
Pt notified of results Will come in for repeat CBC in 6 weeks

## 2012-09-28 NOTE — Telephone Encounter (Signed)
Message copied by Bearl Mulberry on Thu Sep 28, 2012  5:34 PM ------      Message from: Magdalene River      Created: Tue Sep 26, 2012  8:08 AM                   ----- Message -----         From: Ernestina Penna, MD         Sent: 09/26/2012   7:41 AM           To: Fredirick Maudlin, MD, #            CBC had a normal white blood cell count. Hemoglobin was slightly decreased from what it was 3 weeks ago when it was 14.0. This time it is 12.9. Platelets are adequate.------- recheck hemoglobin and CBC in 6 weeks      Electrolytes within normal limits. Blood sugar is slightly elevated at 119. Kidney function tests are good. Liver function tests within normal limit      Make sure that we send this to him ever it is supposed to be sent to.       ------

## 2012-10-02 ENCOUNTER — Encounter (HOSPITAL_COMMUNITY)
Admission: RE | Admit: 2012-10-02 | Discharge: 2012-10-02 | Disposition: A | Discharge status: Home / Self Care | Payer: Medicare Other | Source: Ambulatory Visit | Attending: Pulmonary Disease | Admitting: Pulmonary Disease

## 2012-10-04 ENCOUNTER — Encounter (HOSPITAL_COMMUNITY)
Admission: RE | Admit: 2012-10-04 | Discharge: 2012-10-04 | Disposition: A | Discharge status: Home / Self Care | Payer: Medicare Other | Source: Ambulatory Visit | Attending: Pulmonary Disease | Admitting: Pulmonary Disease

## 2012-10-09 ENCOUNTER — Encounter (HOSPITAL_COMMUNITY)
Admission: RE | Admit: 2012-10-09 | Discharge: 2012-10-09 | Disposition: A | Discharge status: Home / Self Care | Payer: Medicare Other | Source: Ambulatory Visit | Attending: Pulmonary Disease | Admitting: Pulmonary Disease

## 2012-10-11 ENCOUNTER — Encounter (HOSPITAL_COMMUNITY)
Admission: RE | Admit: 2012-10-11 | Discharge: 2012-10-11 | Disposition: A | Discharge status: Home / Self Care | Payer: Medicare Other | Source: Ambulatory Visit | Attending: Pulmonary Disease | Admitting: Pulmonary Disease

## 2012-10-13 ENCOUNTER — Other Ambulatory Visit: Payer: Self-pay | Admitting: Family Medicine

## 2012-10-16 ENCOUNTER — Encounter (HOSPITAL_COMMUNITY)
Admission: RE | Admit: 2012-10-16 | Discharge: 2012-10-16 | Disposition: A | Discharge status: Home / Self Care | Payer: Medicare Other | Source: Ambulatory Visit | Attending: Pulmonary Disease | Admitting: Pulmonary Disease

## 2012-10-18 ENCOUNTER — Encounter (HOSPITAL_COMMUNITY)
Admission: RE | Admit: 2012-10-18 | Discharge: 2012-10-18 | Disposition: A | Discharge status: Home / Self Care | Payer: Medicare Other | Source: Ambulatory Visit | Attending: Pulmonary Disease | Admitting: Pulmonary Disease

## 2012-10-23 ENCOUNTER — Other Ambulatory Visit: Payer: Self-pay | Admitting: Family Medicine

## 2012-10-23 ENCOUNTER — Encounter (HOSPITAL_COMMUNITY): Payer: Medicare Other

## 2012-10-24 ENCOUNTER — Other Ambulatory Visit: Payer: Self-pay | Admitting: *Deleted

## 2012-10-24 MED ORDER — LEVOTHYROXINE SODIUM 25 MCG PO CAPS: 1.0000 | ORAL_CAPSULE | Freq: Every day | ORAL | Status: DC

## 2012-10-25 ENCOUNTER — Encounter (HOSPITAL_COMMUNITY): Payer: Medicare Other

## 2012-10-27 ENCOUNTER — Other Ambulatory Visit: Payer: Self-pay

## 2012-10-27 MED ORDER — SIMVASTATIN 80 MG PO TABS: 80.0000 mg | ORAL_TABLET | Freq: Every day | ORAL | Status: DC

## 2012-10-30 ENCOUNTER — Encounter (HOSPITAL_COMMUNITY)
Admission: RE | Admit: 2012-10-30 | Discharge: 2012-10-30 | Disposition: A | Discharge status: Home / Self Care | Payer: Medicare Other | Source: Ambulatory Visit | Attending: Pulmonary Disease | Admitting: Pulmonary Disease

## 2012-10-30 DIAGNOSIS — Z5189 Encounter for other specified aftercare: Secondary | ICD-10-CM | POA: Insufficient documentation

## 2012-10-30 DIAGNOSIS — J4489 Other specified chronic obstructive pulmonary disease: Secondary | ICD-10-CM | POA: Insufficient documentation

## 2012-10-30 DIAGNOSIS — J449 Chronic obstructive pulmonary disease, unspecified: Secondary | ICD-10-CM | POA: Insufficient documentation

## 2012-10-31 ENCOUNTER — Other Ambulatory Visit: Payer: Self-pay | Admitting: Family Medicine

## 2012-11-01 ENCOUNTER — Encounter (HOSPITAL_COMMUNITY)
Admission: RE | Admit: 2012-11-01 | Discharge: 2012-11-01 | Disposition: A | Discharge status: Home / Self Care | Payer: Medicare Other | Source: Ambulatory Visit | Attending: Pulmonary Disease | Admitting: Pulmonary Disease

## 2012-11-06 ENCOUNTER — Encounter (HOSPITAL_COMMUNITY)
Admission: RE | Admit: 2012-11-06 | Discharge: 2012-11-06 | Disposition: A | Discharge status: Home / Self Care | Payer: Medicare Other | Source: Ambulatory Visit | Attending: Pulmonary Disease | Admitting: Pulmonary Disease

## 2012-11-08 ENCOUNTER — Encounter (HOSPITAL_COMMUNITY)
Admission: RE | Admit: 2012-11-08 | Discharge: 2012-11-08 | Disposition: A | Discharge status: Home / Self Care | Payer: Medicare Other | Source: Ambulatory Visit | Attending: Pulmonary Disease | Admitting: Pulmonary Disease

## 2012-11-09 ENCOUNTER — Other Ambulatory Visit: Payer: Self-pay | Admitting: Family Medicine

## 2012-11-10 ENCOUNTER — Other Ambulatory Visit (INDEPENDENT_AMBULATORY_CARE_PROVIDER_SITE_OTHER): Payer: Medicare Other

## 2012-11-10 DIAGNOSIS — R7989 Other specified abnormal findings of blood chemistry: Secondary | ICD-10-CM

## 2012-11-10 DIAGNOSIS — R05 Cough: Secondary | ICD-10-CM

## 2012-11-10 DIAGNOSIS — R5383 Other fatigue: Secondary | ICD-10-CM

## 2012-11-10 DIAGNOSIS — R5381 Other malaise: Secondary | ICD-10-CM

## 2012-11-10 LAB — POCT CBC
Hemoglobin: 13.4 g/dL — AB (ref 14.1–18.1)
MCH, POC: 28.3 pg (ref 27–31.2)
MCV: 86.9 fL (ref 80–97)
RBC: 4.7 M/uL (ref 4.69–6.13)
WBC: 7.2 10*3/uL (ref 4.6–10.2)

## 2012-11-10 NOTE — Progress Notes (Signed)
Pt came in for recheck on his CBC only

## 2012-11-13 ENCOUNTER — Encounter (HOSPITAL_COMMUNITY)
Admission: RE | Admit: 2012-11-13 | Discharge: 2012-11-13 | Disposition: A | Discharge status: Home / Self Care | Payer: Medicare Other | Source: Ambulatory Visit | Attending: Pulmonary Disease | Admitting: Pulmonary Disease

## 2012-11-13 NOTE — Telephone Encounter (Signed)
Last seen 05/01/12  DWM

## 2012-11-13 NOTE — Telephone Encounter (Signed)
Moore to address 

## 2012-11-15 ENCOUNTER — Encounter (HOSPITAL_COMMUNITY)
Admission: RE | Admit: 2012-11-15 | Discharge: 2012-11-15 | Disposition: A | Discharge status: Home / Self Care | Payer: Medicare Other | Source: Ambulatory Visit | Attending: Pulmonary Disease | Admitting: Pulmonary Disease

## 2012-11-20 ENCOUNTER — Encounter (HOSPITAL_COMMUNITY)
Admission: RE | Admit: 2012-11-20 | Discharge: 2012-11-20 | Disposition: A | Discharge status: Home / Self Care | Payer: Medicare Other | Source: Ambulatory Visit | Attending: Pulmonary Disease | Admitting: Pulmonary Disease

## 2012-11-22 ENCOUNTER — Encounter (HOSPITAL_COMMUNITY)
Admission: RE | Admit: 2012-11-22 | Discharge: 2012-11-22 | Disposition: A | Discharge status: Home / Self Care | Payer: Medicare Other | Source: Ambulatory Visit | Attending: Pulmonary Disease | Admitting: Pulmonary Disease

## 2012-11-27 ENCOUNTER — Encounter (HOSPITAL_COMMUNITY): Payer: Medicare Other

## 2012-11-29 ENCOUNTER — Encounter (HOSPITAL_COMMUNITY)
Admission: RE | Admit: 2012-11-29 | Discharge: 2012-11-29 | Disposition: A | Discharge status: Home / Self Care | Payer: Medicare Other | Source: Ambulatory Visit | Attending: Pulmonary Disease | Admitting: Pulmonary Disease

## 2012-11-29 DIAGNOSIS — J449 Chronic obstructive pulmonary disease, unspecified: Secondary | ICD-10-CM | POA: Insufficient documentation

## 2012-11-29 DIAGNOSIS — Z5189 Encounter for other specified aftercare: Secondary | ICD-10-CM | POA: Insufficient documentation

## 2012-11-29 DIAGNOSIS — J4489 Other specified chronic obstructive pulmonary disease: Secondary | ICD-10-CM | POA: Insufficient documentation

## 2012-12-04 ENCOUNTER — Encounter (HOSPITAL_COMMUNITY)
Admission: RE | Admit: 2012-12-04 | Discharge: 2012-12-04 | Disposition: A | Discharge status: Home / Self Care | Payer: Medicare Other | Source: Ambulatory Visit | Attending: Pulmonary Disease | Admitting: Pulmonary Disease

## 2012-12-06 ENCOUNTER — Encounter (HOSPITAL_COMMUNITY)
Admission: RE | Admit: 2012-12-06 | Discharge: 2012-12-06 | Disposition: A | Discharge status: Home / Self Care | Payer: Medicare Other | Source: Ambulatory Visit | Attending: Pulmonary Disease | Admitting: Pulmonary Disease

## 2012-12-11 ENCOUNTER — Encounter (HOSPITAL_COMMUNITY): Payer: Medicare Other

## 2012-12-13 ENCOUNTER — Encounter (HOSPITAL_COMMUNITY): Payer: Medicare Other

## 2013-01-06 ENCOUNTER — Other Ambulatory Visit: Payer: Self-pay | Admitting: Family Medicine

## 2013-01-30 ENCOUNTER — Other Ambulatory Visit: Payer: Self-pay

## 2013-01-30 MED ORDER — SIMVASTATIN 80 MG PO TABS: 80.0000 mg | ORAL_TABLET | Freq: Every day | ORAL | Status: DC

## 2013-02-13 NOTE — Addendum Note (Signed)
Encounter addended by: Angelica Pou, RN on: 02/13/2013  2:20 PM<BR>     Documentation filed: Notes Section

## 2013-02-13 NOTE — Progress Notes (Signed)
Pulmonary Rehabilitation Program Outcomes Report   Orientation:  07/13/2012 Graduate Date:  12/06/2012  Discharge Date:  12/06/2012 # of sessions completed: 36 DX: ILD  Pulmonologist: Kallie Locks MD:  Benedetto Goad Time:  13:00  A.  Exercise Program:  Tolerates exercise @ 3.84 METS for 15 minutes, Walk Test Results:  Pre: No walk test done Pre or Post and Discharged to home exercise program.  Anticipated compliance:  good  B.  Mental Health:  Good mental attitude  C.  Education/Instruction/Skills  Accurately checks own pulse.  Rest:  73  Exercise: 95, Knows THR for exercise, Uses Perceived Exertion Scale and/or Dyspnea Scale and Attended 12 education classes  Uses Perceived Exertion Scale and/or Dyspnea Scale  D.  Nutrition/Weight Control/Body Composition:  Adherence to prescribed nutrition program: good    E.  Blood Lipids    Lab Results  Component Value Date   LDLCALC 92 09/04/2012   TRIG 80 09/04/2012    F.  Lifestyle Changes:  Making positive lifestyle changes  G.  Symptoms noted with exercise:  Asymptomatic  Report Completed By:  Lelon Huh. Presten Joost RN   Comments:  This is patients Graduation report. He did well in rehab. HE achieved a peak METS of 3.84. His resting HR was 73 and resting BP was 120/60.  Peak HR was 95 and peak BP was 138/78. Will call patient upon being out of rehab at 1 month, 6 months and 1 year.

## 2013-02-13 NOTE — Addendum Note (Signed)
Encounter addended by: Angelica Pou, RN on: 02/13/2013  2:24 PM<BR>     Documentation filed: Clinical Notes

## 2013-02-19 ENCOUNTER — Encounter: Payer: Self-pay | Admitting: Family Medicine

## 2013-02-19 ENCOUNTER — Ambulatory Visit (INDEPENDENT_AMBULATORY_CARE_PROVIDER_SITE_OTHER): Payer: Medicare Other | Admitting: Family Medicine

## 2013-02-19 ENCOUNTER — Encounter (INDEPENDENT_AMBULATORY_CARE_PROVIDER_SITE_OTHER): Payer: Self-pay

## 2013-02-19 VITALS — BP 119/75 | HR 76 | Temp 97.5°F | Ht 65.0 in | Wt 181.0 lb

## 2013-02-19 DIAGNOSIS — Z23 Encounter for immunization: Secondary | ICD-10-CM

## 2013-02-19 DIAGNOSIS — I2510 Atherosclerotic heart disease of native coronary artery without angina pectoris: Secondary | ICD-10-CM

## 2013-02-19 DIAGNOSIS — E039 Hypothyroidism, unspecified: Secondary | ICD-10-CM

## 2013-02-19 DIAGNOSIS — J849 Interstitial pulmonary disease, unspecified: Secondary | ICD-10-CM

## 2013-02-19 DIAGNOSIS — N400 Enlarged prostate without lower urinary tract symptoms: Secondary | ICD-10-CM

## 2013-02-19 DIAGNOSIS — E785 Hyperlipidemia, unspecified: Secondary | ICD-10-CM

## 2013-02-19 DIAGNOSIS — E559 Vitamin D deficiency, unspecified: Secondary | ICD-10-CM

## 2013-02-19 DIAGNOSIS — J8410 Pulmonary fibrosis, unspecified: Secondary | ICD-10-CM

## 2013-02-19 LAB — POCT CBC
Granulocyte percent: 71.8 %G (ref 37–80)
HCT, POC: 41.1 % — AB (ref 43.5–53.7)
Lymph, poc: 2.1 (ref 0.6–3.4)
MCHC: 32.2 g/dL (ref 31.8–35.4)
MCV: 87.9 fL (ref 80–97)
POC Granulocyte: 6.4 (ref 2–6.9)
Platelet Count, POC: 202 10*3/uL (ref 142–424)
RDW, POC: 15.3 %
WBC: 8.9 10*3/uL (ref 4.6–10.2)

## 2013-02-19 MED ORDER — OMEPRAZOLE 20 MG PO CPDR: DELAYED_RELEASE_CAPSULE | ORAL | Status: DC

## 2013-02-19 MED ORDER — TERAZOSIN HCL 10 MG PO CAPS: ORAL_CAPSULE | ORAL | Status: DC

## 2013-02-19 MED ORDER — SIMVASTATIN 80 MG PO TABS: 80.0000 mg | ORAL_TABLET | Freq: Every day | ORAL | Status: DC

## 2013-02-19 MED ORDER — LEVOTHYROXINE SODIUM 25 MCG PO CAPS: 1.0000 | ORAL_CAPSULE | Freq: Every day | ORAL | Status: DC

## 2013-02-19 MED ORDER — LEVOTHYROXINE SODIUM 125 MCG PO TABS: ORAL_TABLET | ORAL | Status: DC

## 2013-02-19 NOTE — Progress Notes (Signed)
Subjective:    Patient ID: Raymond [name], male    DOB: 1929/06/10, 79 y.o.   MRN: [id number]  HPI Pt here for follow up and management of chronic medical problems. Patient sees Dr. Shaune Pollack for his pulmonary fibrosis. Health maintenance we will need a Prevnar today. Will get lab work today. The patient has had 3 visits at [facility] with a pulmonary lung specialist. His name is Dr. Glynda Jaeger. He has the patient on CellCept, and Bactrim 1 by mouth on Monday Wednesday and Friday for his diagnosis of interstitial lung disease. The patient uses O2 regularly. He also sustained Dr. Juanetta Gosling       Patient Active Problem List   Diagnosis Date Noted  . BPH (benign prostatic hyperplasia) 09/04/2012  . Allergic rhinitis 09/24/2010  .  Cough, chronic 06/26/2010  . PULMONARY FIBROSIS ILD POST INFLAMMATORY CHRONIC 12/30/2009  . HYPOTHYROIDISM 12/29/2009  . HYPERLIPIDEMIA 12/29/2009  . CORONARY HEART DISEASE 12/29/2009   Outpatient Encounter Prescriptions as of 02/19/2013  Medication Sig  . albuterol (PROVENTIL) (2.5 MG/3ML) 0.083% nebulizer solution Take 2.5 mg by nebulization every 6 (six) hours as needed.  Marland Kitchen aspirin 325 MG tablet Take 325 mg by mouth daily.  . Cholecalciferol (VITAMIN D) 2000 UNITS CAPS Take 2,000-4,000 Units by mouth daily. Takes 2000 units everyday except for Fri, Sat, and Sun patient takes 4000 units.  Marland Kitchen levothyroxine (SYNTHROID, LEVOTHROID) 125 MCG tablet TAKE ONE TABLET BY MOUTH EVERY DAY EXCEPT TAKE ONE-HALF TABLET BY MOUTH ON SATURDAY AND SUNDAY  . Levothyroxine Sodium 25 MCG CAPS Take 1 capsule (25 mcg total) by mouth daily before breakfast.  . mycophenolate (CELLCEPT) 500 MG tablet 1,000 mg 2 (two) times daily.   Marland Kitchen omeprazole (PRILOSEC) 20 MG capsule TAKE ONE CAPSULE BY MOUTH TWICE DAILY BEFORE  A  MEAL  . predniSONE (DELTASONE) 20 MG tablet Take 10 mg by mouth daily.   . simvastatin (ZOCOR) 80 MG tablet Take 1 tablet (80 mg total) by mouth daily.  Marland Kitchen  sulfamethoxazole-trimethoprim (BACTRIM DS) 800-160 MG per tablet Take 1 tablet by mouth every Monday, Wednesday, and Friday.  . terazosin (HYTRIN) 10 MG capsule TAKE ONE CAPSULE BY MOUTH ONCE DAILY  . [DISCONTINUED] Fluticasone-Salmeterol (ADVAIR DISKUS) 250-50 MCG/DOSE AEPB Inhale 1 puff into the lungs 2 (two) times daily.    Review of Systems  Constitutional: Negative.   HENT: Positive for congestion.   Eyes: Negative.   Respiratory: Negative.   Cardiovascular: Negative.   Gastrointestinal: Negative.   Endocrine: Negative.   Genitourinary: Negative.   Musculoskeletal: Negative.   Skin: Negative.   Allergic/Immunologic: Negative.   Neurological: Negative.   Hematological: Negative.   Psychiatric/Behavioral: Negative.        Objective:   Physical Exam  Nursing note and vitals reviewed. Constitutional: He is oriented to person, place, and time. He appears well-developed and well-nourished. No distress.  Pleasant  HENT:  Head: Normocephalic and atraumatic.  Right Ear: External ear normal.  Left Ear: External ear normal.  Mouth/Throat: Oropharynx is clear and moist. No oropharyngeal exudate.  Patient has a lot of intranasal irritation in the right nostril secondary to the oxygen cannula  Eyes: Conjunctivae and EOM are normal. Pupils are equal, round, and reactive to light. Right eye exhibits no discharge. Left eye exhibits no discharge. No scleral icterus.  Neck: Normal range of motion. Neck supple. No thyromegaly present.  No carotid bruits  Cardiovascular: Normal rate, regular rhythm, normal heart sounds and intact distal pulses.  Exam reveals no gallop and  no friction rub.   No murmur heard. At 60 per minute  Pulmonary/Chest: Effort normal. No respiratory distress. He has no wheezes. He has no rales. He exhibits no tenderness.  Breath sounds are generally diminished. There were no crackles or rales or wheezes today, which is good for patient.  Abdominal: Soft. Bowel sounds  are normal. He exhibits no mass. There is no tenderness. There is no rebound and no guarding.   There is an umbilical hernia that is small and patient has been aware of this  Musculoskeletal: Normal range of motion. He exhibits no edema and no tenderness.  Lymphadenopathy:    He has no cervical adenopathy.  Neurological: He is alert and oriented to person, place, and time. He has normal reflexes. No cranial nerve deficit.  Skin: Skin is warm and dry. No rash noted. No erythema. No pallor.  Psychiatric: He has a normal mood and affect. His behavior is normal. Judgment and thought content normal.   BP 119/75  Pulse 76  Temp(Src) 97.5 F (36.4 C) (Oral)  Ht 5\' 5"  (1.651 m)  Wt 181 lb (82.101 kg)  BMI 30.12 kg/m2        Assessment & Plan:   1. HYPERLIPIDEMIA   2. BPH (benign prostatic hyperplasia)   3. CORONARY HEART DISEASE   4. HYPOTHYROIDISM   5. Vitamin D deficiency   6. Need for prophylactic vaccination against Streptococcus pneumoniae (pneumococcus)   7. Interstitial lung disease    Orders Placed This Encounter  Procedures  . Pneumococcal conjugate vaccine 13-valent  . BMP8+EGFR  . Hepatic function panel  . NMR, lipoprofile  . Vit D  25 hydroxy (rtn osteoporosis monitoring)  . POCT CBC   Meds ordered this encounter  Medications  . sulfamethoxazole-trimethoprim (BACTRIM DS) 800-160 MG per tablet    Sig: Take 1 tablet by mouth every Monday, Wednesday, and Friday.  . simvastatin (ZOCOR) 80 MG tablet    Sig: Take 1 tablet (80 mg total) by mouth daily.    Dispense:  90 tablet    Refill:  2  . Levothyroxine Sodium 25 MCG CAPS    Sig: Take 1 capsule (25 mcg total) by mouth daily before breakfast.    Dispense:  90 capsule    Refill:  2  . levothyroxine (SYNTHROID, LEVOTHROID) 125 MCG tablet    Sig: TAKE ONE TABLET BY MOUTH EVERY DAY EXCEPT TAKE ONE-HALF TABLET BY MOUTH ON SATURDAY AND SUNDAY    Dispense:  90 tablet    Refill:  2  . omeprazole (PRILOSEC) 20 MG capsule     Sig: TAKE ONE CAPSULE BY MOUTH TWICE DAILY BEFORE  A  MEAL    Dispense:  180 capsule    Refill:  2  . terazosin (HYTRIN) 10 MG capsule    Sig: TAKE ONE CAPSULE BY MOUTH ONCE DAILY    Dispense:  90 capsule    Refill:  2   Patient Instructions  Continue current medications. Continue good therapeutic lifestyle changes which include good diet and exercise. Fall precautions discussed with patient. Schedule your flu vaccine if you haven't had it yet If you are over 57 years old - you may need Prevnar 13 or the adult Pneumonia vaccine. Drink plenty of fluids Keep house cooler Use a cool mist humidifier Use Mucinex maximum strength one twice daily with a large glass of water if needed for increased congestion Use the saline nose spray and gel to keep nasal irritation to a minimum   Don W.  Laurance Flatten MD

## 2013-02-19 NOTE — Patient Instructions (Addendum)
Continue current medications. Continue good therapeutic lifestyle changes which include good diet and exercise. Fall precautions discussed with patient. Schedule your flu vaccine if you haven't had it yet If you are over 77 years old - you may need Prevnar 13 or the adult Pneumonia vaccine. Drink plenty of fluids Keep house cooler Use a cool mist humidifier Use Mucinex maximum strength one twice daily with a large glass of water if needed for increased congestion Use the saline nose spray and gel to keep nasal irritation to a minimum

## 2013-02-21 LAB — NMR, LIPOPROFILE
Cholesterol: 173 mg/dL (ref <200)
HDL Cholesterol by NMR: 79 mg/dL (ref >=40)
HDL Particle Number: 38 umol/L (ref >=30.5)
LDL Particle Number: 774 nmol/L (ref <1000)
LDLC SERPL CALC-MCNC: 70 mg/dL (ref <100)
Small LDL Particle Number: 94 nmol/L (ref <=527)
Triglycerides by NMR: 121 mg/dL (ref <150)

## 2013-02-21 LAB — BMP8+EGFR
BUN/Creatinine Ratio: 28 — ABNORMAL HIGH (ref 10–22)
BUN: 21 mg/dL (ref 8–27)
CO2: 26 mmol/L (ref 18–29)
Calcium: 9.6 mg/dL (ref 8.6–10.2)
Chloride: 97 mmol/L (ref 97–108)
GFR calc non Af Amer: 87 mL/min/{1.73_m2} (ref >59)
Sodium: 141 mmol/L (ref 134–144)

## 2013-02-21 LAB — SPECIMEN STATUS REPORT

## 2013-02-21 LAB — HEPATIC FUNCTION PANEL
ALT: 23 IU/L (ref 0–44)
AST: 21 IU/L (ref 0–40)
Albumin: 4.2 g/dL (ref 3.5–4.7)

## 2013-02-21 LAB — VITAMIN D 25 HYDROXY (VIT D DEFICIENCY, FRACTURES): Vit D, 25-Hydroxy: 43.4 ng/mL (ref 30.0–100.0)

## 2013-03-13 ENCOUNTER — Other Ambulatory Visit: Payer: Self-pay | Admitting: Family Medicine

## 2013-04-02 ENCOUNTER — Telehealth: Payer: Self-pay | Admitting: Family Medicine

## 2013-04-11 ENCOUNTER — Ambulatory Visit (INDEPENDENT_AMBULATORY_CARE_PROVIDER_SITE_OTHER): Payer: Medicare Other | Admitting: Family Medicine

## 2013-04-11 ENCOUNTER — Encounter: Payer: Self-pay | Admitting: Family Medicine

## 2013-04-11 VITALS — BP 129/79 | HR 93 | Temp 97.9°F | Ht 65.0 in | Wt 181.0 lb

## 2013-04-11 DIAGNOSIS — J849 Interstitial pulmonary disease, unspecified: Secondary | ICD-10-CM

## 2013-04-11 DIAGNOSIS — J8410 Pulmonary fibrosis, unspecified: Secondary | ICD-10-CM

## 2013-04-11 NOTE — Patient Instructions (Addendum)
Continue current medications. Continue good therapeutic lifestyle changes which include good diet and exercise. [facility] and patient is okay to drive using his oxygen

## 2013-04-11 NOTE — Telephone Encounter (Signed)
Pt has appt today

## 2013-04-11 NOTE — Progress Notes (Signed)
Subjective:    Patient ID: Raymond [name], male    DOB: 11-07-1929, 80 y.o.   MRN: [id number]  HPI Patient here today for DMV papers to be filled out. The only section on the BMI papers that need to be completed within the respiratory section. He has a diagnosis of interstitial lung disease or pulmonary fibrosis. He is currently doing well with this and has his own portable oxygen. He sees a pulmonary specialist regularly and uses his inhalers appropriately. His review of systems are negative.      Patient Active Problem List   Diagnosis Date Noted  . Vitamin D deficiency 02/19/2013  . Interstitial lung disease 02/19/2013  . BPH (benign prostatic hyperplasia) 09/04/2012  . Allergic rhinitis 09/24/2010  .  Cough, chronic 06/26/2010  . PULMONARY FIBROSIS ILD POST INFLAMMATORY CHRONIC 12/30/2009  . HYPOTHYROIDISM 12/29/2009  . HYPERLIPIDEMIA 12/29/2009  . CORONARY HEART DISEASE 12/29/2009   Outpatient Encounter Prescriptions as of 04/11/2013  Medication Sig  . albuterol (PROVENTIL) (2.5 MG/3ML) 0.083% nebulizer solution Take 2.5 mg by nebulization every 6 (six) hours as needed.  Marland Kitchen. aspirin 325 MG tablet Take 325 mg by mouth daily.  . Cholecalciferol (VITAMIN D) 2000 UNITS CAPS Take 2,000-4,000 Units by mouth daily. Takes 2000 units everyday except for Fri, Sat, and Sun patient takes 4000 units.  Marland Kitchen. levothyroxine (SYNTHROID, LEVOTHROID) 125 MCG tablet TAKE ONE TABLET BY MOUTH EVERY DAY EXCEPT TAKE ONE-HALF TABLET BY MOUTH ON SATURDAY AND SUNDAY  . Levothyroxine Sodium 25 MCG CAPS Take 1 capsule (25 mcg total) by mouth daily before breakfast.  . mycophenolate (CELLCEPT) 500 MG tablet 1,000 mg 2 (two) times daily.   . predniSONE (DELTASONE) 20 MG tablet Take 10 mg by mouth daily.   . simvastatin (ZOCOR) 80 MG tablet Take 1 tablet (80 mg total) by mouth daily.  Marland Kitchen. terazosin (HYTRIN) 10 MG capsule TAKE ONE CAPSULE BY MOUTH ONCE DAILY  . [DISCONTINUED] omeprazole (PRILOSEC) 20 MG capsule  TAKE ONE CAPSULE BY MOUTH TWICE DAILY BEFORE  A  MEAL  . [DISCONTINUED] sulfamethoxazole-trimethoprim (BACTRIM DS) 800-160 MG per tablet Take 1 tablet by mouth every Monday, Wednesday, and Friday.  . [DISCONTINUED] terazosin (HYTRIN) 10 MG capsule TAKE ONE CAPSULE BY MOUTH ONCE DAILY    Review of Systems  Constitutional: Negative.   HENT: Negative.   Eyes: Negative.   Respiratory: Negative.   Cardiovascular: Negative.   Gastrointestinal: Negative.   Endocrine: Negative.   Genitourinary: Negative.   Musculoskeletal: Negative.   Skin: Negative.   Allergic/Immunologic: Negative.   Neurological: Negative.   Hematological: Negative.   Psychiatric/Behavioral: Negative.        Objective:   Physical Exam  Nursing note and vitals reviewed. Constitutional: He is oriented to person, place, and time. He appears well-developed and well-nourished. No distress.  HENT:  Head: Normocephalic and atraumatic.  Eyes: Conjunctivae and EOM are normal. Pupils are equal, round, and reactive to light. Right eye exhibits no discharge. Left eye exhibits no discharge. No scleral icterus.  Neck: Normal range of motion. Neck supple. No thyromegaly present.  Cardiovascular: Normal rate, regular rhythm and normal heart sounds.   Regular rate and rhythm at 72 per minute  Pulmonary/Chest: Effort normal. No respiratory distress. He has no wheezes. He has no rales. He exhibits no tenderness.  A few sparse crackles in both lung bases--- no change from the past  Musculoskeletal: Normal range of motion.  Neurological: He is alert and oriented to person, place, and time.  Skin:  Skin is warm and dry. No rash noted. He is not diaphoretic.  Psychiatric: He has a normal mood and affect. His behavior is normal. Judgment and thought content normal.   BP 129/79  Pulse 93  Temp(Src) 97.9 F (36.6 C) (Oral)  Ht 5\' 5"  (1.651 m)  Wt 181 lb (82.101 kg)  BMI 30.12 kg/m2  In terms of anatomy scan and the patient's record  and my conclusion therefore the patient is that he should have no restrictions for driving other than he must wear his oxygen while he is driving      Assessment & Plan:  1. PULMONARY FIBROSIS ILD POST INFLAMMATORY CHRONIC  2. Interstitial lung disease  Patient Instructions  Continue current medications. Continue good therapeutic lifestyle changes which include good diet and exercise. Forms completed for the [facility] and patient is okay to drive using his oxygen   Raymond Capes MD

## 2013-06-26 ENCOUNTER — Encounter (INDEPENDENT_AMBULATORY_CARE_PROVIDER_SITE_OTHER): Payer: Self-pay

## 2013-06-26 ENCOUNTER — Encounter: Payer: Self-pay | Admitting: Family Medicine

## 2013-06-26 ENCOUNTER — Ambulatory Visit (INDEPENDENT_AMBULATORY_CARE_PROVIDER_SITE_OTHER): Payer: Medicare Other | Admitting: Family Medicine

## 2013-06-26 VITALS — BP 124/77 | HR 85 | Temp 97.0°F | Ht 65.0 in | Wt 179.0 lb

## 2013-06-26 DIAGNOSIS — J8410 Pulmonary fibrosis, unspecified: Secondary | ICD-10-CM

## 2013-06-26 DIAGNOSIS — J849 Interstitial pulmonary disease, unspecified: Secondary | ICD-10-CM

## 2013-06-26 DIAGNOSIS — E559 Vitamin D deficiency, unspecified: Secondary | ICD-10-CM

## 2013-06-26 DIAGNOSIS — E785 Hyperlipidemia, unspecified: Secondary | ICD-10-CM

## 2013-06-26 DIAGNOSIS — N400 Enlarged prostate without lower urinary tract symptoms: Secondary | ICD-10-CM

## 2013-06-26 DIAGNOSIS — E039 Hypothyroidism, unspecified: Secondary | ICD-10-CM

## 2013-06-26 LAB — POCT CBC
Granulocyte percent: 78 %G (ref 37–80)
HEMATOCRIT: 44.6 % (ref 43.5–53.7)
Hemoglobin: 13.8 g/dL — AB (ref 14.1–18.1)
Lymph, poc: 1.9 (ref 0.6–3.4)
MCH, POC: 28.1 pg (ref 27–31.2)
MCHC: 30.9 g/dL — AB (ref 31.8–35.4)
MCV: 91 fL (ref 80–97)
MPV: 7.8 fL (ref 0–99.8)
PLATELET COUNT, POC: 219 10*3/uL (ref 142–424)
POC Granulocyte: 7.7 — AB (ref 2–6.9)
POC LYMPH PERCENT: 19 %L (ref 10–50)
RBC: 4.9 M/uL (ref 4.69–6.13)
RDW, POC: 14.8 %
WBC: 9.9 10*3/uL (ref 4.6–10.2)

## 2013-06-26 NOTE — Patient Instructions (Addendum)
Medicare Annual Wellness Visit  [place] and the medical providers at [facility] strive to bring you the best medical care.  In doing so we not only want to address your current medical conditions and concerns but also to detect new conditions early and prevent illness, disease and health-related problems.    Medicare offers a yearly Wellness Visit which allows our clinical staff to assess your need for preventative services including immunizations, lifestyle education, counseling to decrease risk of preventable diseases and screening for fall risk and other medical concerns.    This visit is provided free of charge (no copay) for all Medicare recipients. The clinical pharmacists at [facility] have begun to conduct these Wellness Visits which will also include a thorough review of all your medications.    As you primary medical provider recommend that you make an appointment for your Annual Wellness Visit if you have not done so already this year.  You may set up this appointment before you leave today or you may call back [phone] and schedule an appointment.  Please make sure when you call that you mention that you are scheduling your Annual Wellness Visit with the clinical pharmacist so that the appointment may be made for the proper length of time.      Continue current medications. Continue good therapeutic lifestyle changes which include good diet and exercise. Fall precautions discussed with patient. If an FOBT was given today- please return it to our front desk. If you are over 78 years old - you may need Prevnar 13 or the adult Pneumonia vaccine.  Continue physical therapy Mood slightly to keep herself from falling Drink plenty of fluids We will call you with the results of the lab work once those results are available

## 2013-06-26 NOTE — Progress Notes (Signed)
Subjective:    Patient ID: Raymond [name], male    DOB: 12/07/1929, 80 y.o.   MRN: [id number]  HPI Pt here for follow up and management of chronic medical problems. The patient's biggest problem is his weakness. He is stable with his lung disease. He sees 2 different pulmonary specialist. He is getting physical therapy and rehabilitation on a regular basis and this has helped him keep his strength.        Patient Active Problem List   Diagnosis Date Noted  . Vitamin D deficiency 02/19/2013  . Interstitial lung disease 02/19/2013  . BPH (benign prostatic hyperplasia) 09/04/2012  . Allergic rhinitis 09/24/2010  .  Cough, chronic 06/26/2010  . PULMONARY FIBROSIS ILD POST INFLAMMATORY CHRONIC 12/30/2009  . HYPOTHYROIDISM 12/29/2009  . HYPERLIPIDEMIA 12/29/2009  . CORONARY HEART DISEASE 12/29/2009   Outpatient Encounter Prescriptions as of 06/26/2013  Medication Sig  . albuterol (PROVENTIL) (2.5 MG/3ML) 0.083% nebulizer solution Take 2.5 mg by nebulization every 6 (six) hours as needed.  Marland Kitchen aspirin 325 MG tablet Take 325 mg by mouth daily.  . Cholecalciferol (VITAMIN D) 2000 UNITS CAPS Take 2,000-4,000 Units by mouth daily. Takes 2000 units everyday except for Fri, Sat, and Sun patient takes 4000 units.  Marland Kitchen levothyroxine (SYNTHROID, LEVOTHROID) 125 MCG tablet TAKE ONE TABLET BY MOUTH EVERY DAY EXCEPT TAKE ONE-HALF TABLET BY MOUTH ON SATURDAY AND SUNDAY  . Levothyroxine Sodium 25 MCG CAPS Take 1 capsule by mouth daily before breakfast. Mon, wed, fri  . mycophenolate (CELLCEPT) 500 MG tablet 1,000 mg 2 (two) times daily.   Marland Kitchen omeprazole (PRILOSEC) 20 MG capsule Take 1 capsule by mouth daily.  . predniSONE (DELTASONE) 20 MG tablet Take 10 mg by mouth daily.   . simvastatin (ZOCOR) 80 MG tablet Take 1 tablet (80 mg total) by mouth daily.  Marland Kitchen sulfamethoxazole-trimethoprim (BACTRIM DS) 800-160 MG per tablet Mon, wed, fri- ONLY  . terazosin (HYTRIN) 10 MG capsule TAKE ONE CAPSULE BY MOUTH  ONCE DAILY  . [DISCONTINUED] Levothyroxine Sodium 25 MCG CAPS Take 1 capsule (25 mcg total) by mouth daily before breakfast.    Review of Systems  HENT: Negative.   Eyes: Negative.   Respiratory: Negative.   Cardiovascular: Negative.   Gastrointestinal: Negative.   Endocrine: Negative.   Genitourinary: Negative.   Musculoskeletal: Negative.   Skin: Negative.   Allergic/Immunologic: Negative.   Neurological: Positive for weakness.  Hematological: Negative.   Psychiatric/Behavioral: Negative.        Objective:   Physical Exam  Nursing note and vitals reviewed. Constitutional: He is oriented to person, place, and time. He appears well-developed and well-nourished. No distress.  The patient is alert and cooperative and comes to the visit today with his portable oxygen because of his interstitial lung disease.  HENT:  Head: Normocephalic and atraumatic.  Right Ear: External ear normal.  Left Ear: External ear normal.  Nose: Nose normal.  Mouth/Throat: Oropharynx is clear and moist. No oropharyngeal exudate.  Eyes: Conjunctivae and EOM are normal. Pupils are equal, round, and reactive to light. Right eye exhibits no discharge. Left eye exhibits no discharge. No scleral icterus.  Neck: Normal range of motion. Neck supple. No thyromegaly present.  No carotid bruits  Cardiovascular: Normal rate, regular rhythm, normal heart sounds and intact distal pulses.  Exam reveals no gallop and no friction rub.   No murmur heard. At 72 per minute  Pulmonary/Chest: Effort normal. No respiratory distress. He has no wheezes. He has rales. He exhibits no  tenderness.  A few crackles in the left lung base posteriorly. Breath sounds are much better than usual. No axillary nodes  Abdominal: Soft. Bowel sounds are normal. He exhibits no mass. There is no tenderness. There is no rebound and no guarding.  Patient has umbilical hernia which is not causing him any pain.  Genitourinary: Rectum normal.    Musculoskeletal: Normal range of motion. He exhibits no edema and no tenderness.  Lymphadenopathy:    He has no cervical adenopathy.  Neurological: He is alert and oriented to person, place, and time. He has normal reflexes. No cranial nerve deficit.  Skin: Skin is warm and dry. No rash noted. No erythema. No pallor.  Psychiatric: He has a normal mood and affect. His behavior is normal. Judgment and thought content normal.   BP 124/77  Pulse 85  Temp(Src) 97 F (36.1 C) (Oral)  Ht $R'5\' 5"'rx$  (1.651 m)  Wt 179 lb (81.194 kg)  BMI 29.79 kg/m2        Assessment & Plan:  1. BPH (benign prostatic hyperplasia) - POCT CBC  2. HYPERLIPIDEMIA - POCT CBC - BMP8+EGFR - Hepatic function panel - NMR, lipoprofile  3. HYPOTHYROIDISM - POCT CBC - Thyroid Panel With TSH  4. Vitamin D deficiency - POCT CBC - Vit D  25 hydroxy (rtn osteoporosis monitoring)  5. Interstitial lung disease -Continued followup with a lung specialists  Patient Instructions                       Medicare Annual Wellness Visit  [place] and the medical providers at [place] strive to bring you the best medical care.  In doing so we not only want to address your current medical conditions and concerns but also to detect new conditions early and prevent illness, disease and health-related problems.    Medicare offers a yearly Wellness Visit which allows our clinical staff to assess your need for preventative services including immunizations, lifestyle education, counseling to decrease risk of preventable diseases and screening for fall risk and other medical concerns.    This visit is provided free of charge (no copay) for all Medicare recipients. The clinical pharmacists at [facility] have begun to conduct these Wellness Visits which will also include a thorough review of all your medications.    As you primary medical provider recommend that you make an  appointment for your Annual Wellness Visit if you have not done so already this year.  You may set up this appointment before you leave today or you may call back [phone]) and schedule an appointment.  Please make sure when you call that you mention that you are scheduling your Annual Wellness Visit with the clinical pharmacist so that the appointment may be made for the proper length of time.      Continue current medications. Continue good therapeutic lifestyle changes which include good diet and exercise. Fall precautions discussed with patient. If an FOBT was given today- please return it to our front desk. If you are over 98 years old - you may need Prevnar 35 or the adult Pneumonia vaccine.  Continue physical therapy Mood slightly to keep herself from falling Drink plenty of fluids We will call you with the results of the lab work once those results are available   Arrie Senate MD

## 2013-06-27 LAB — BMP8+EGFR
BUN/Creatinine Ratio: 21 (ref 10–22)
BUN: 16 mg/dL (ref 8–27)
CHLORIDE: 95 mmol/L — AB (ref 97–108)
CO2: 27 mmol/L (ref 18–29)
Calcium: 9.6 mg/dL (ref 8.6–10.2)
Creatinine, Ser: 0.78 mg/dL (ref 0.76–1.27)
GFR calc Af Amer: 97 mL/min/{1.73_m2} (ref >59)
GFR calc non Af Amer: 84 mL/min/{1.73_m2} (ref >59)
GLUCOSE: 114 mg/dL — AB (ref 65–99)
Potassium: 4.5 mmol/L (ref 3.5–5.2)
Sodium: 138 mmol/L (ref 134–144)

## 2013-06-27 LAB — NMR, LIPOPROFILE
CHOLESTEROL: 187 mg/dL (ref <200)
HDL CHOLESTEROL BY NMR: 74 mg/dL (ref >=40)
HDL PARTICLE NUMBER: 37.8 umol/L (ref >=30.5)
LDL PARTICLE NUMBER: 849 nmol/L (ref <1000)
LDL Size: 21.1 nm (ref >20.5)
LDLC SERPL CALC-MCNC: 83 mg/dL (ref <100)
LP-IR Score: <25 (ref <=45)
Small LDL Particle Number: <90 nmol/L (ref <=527)
Triglycerides by NMR: 152 mg/dL — ABNORMAL HIGH (ref <150)

## 2013-06-27 LAB — HEPATIC FUNCTION PANEL
ALT: 50 IU/L — ABNORMAL HIGH (ref 0–44)
AST: 66 IU/L — ABNORMAL HIGH (ref 0–40)
Albumin: 4 g/dL (ref 3.5–4.7)
Alkaline Phosphatase: 50 IU/L (ref 39–117)
Bilirubin, Direct: 0.27 mg/dL (ref 0.00–0.40)
TOTAL PROTEIN: 6.2 g/dL (ref 6.0–8.5)
Total Bilirubin: 0.8 mg/dL (ref 0.0–1.2)

## 2013-06-27 LAB — THYROID PANEL WITH TSH
FREE THYROXINE INDEX: 3.6 (ref 1.2–4.9)
T3 UPTAKE RATIO: 34 % (ref 24–39)
T4 TOTAL: 10.6 ug/dL (ref 4.5–12.0)
TSH: 1.39 u[IU]/mL (ref 0.450–4.500)

## 2013-06-27 LAB — VITAMIN D 25 HYDROXY (VIT D DEFICIENCY, FRACTURES): Vit D, 25-Hydroxy: 40.4 ng/mL (ref 30.0–100.0)

## 2013-07-20 ENCOUNTER — Encounter: Payer: Self-pay | Admitting: *Deleted

## 2013-08-09 ENCOUNTER — Inpatient Hospital Stay
Admission: RE | Admit: 2013-08-09 | Discharge: 2013-08-09 | Disposition: A | Discharge status: Home / Self Care | Payer: Self-pay | Source: Ambulatory Visit | Attending: Family Medicine | Admitting: Family Medicine

## 2013-08-09 ENCOUNTER — Ambulatory Visit (INDEPENDENT_AMBULATORY_CARE_PROVIDER_SITE_OTHER): Payer: Medicare Other | Admitting: Pharmacist

## 2013-08-09 ENCOUNTER — Encounter: Payer: Self-pay | Admitting: Pharmacist

## 2013-08-09 VITALS — BP 108/62 | HR 74 | Ht 65.0 in | Wt 180.0 lb

## 2013-08-09 DIAGNOSIS — Z0000 Encounter for general adult medical examination without abnormal findings: Secondary | ICD-10-CM

## 2013-08-09 DIAGNOSIS — Z79899 Other long term (current) drug therapy: Secondary | ICD-10-CM

## 2013-08-09 NOTE — Progress Notes (Signed)
Subjective:    Raymond SkainsWilliam H [name] is a 80 y.o. male who presents for Medicare Initial preventive examination.   Preventive Screening-Counseling & Management  Tobacco History  Smoking status  . Former Smoker -- 2.00 packs/day for 30 years  . Quit date: 03/29/1980  Smokeless tobacco  . Not on file   Current Problems (verified) Patient Active Problem List   Diagnosis Date Noted  . Vitamin D deficiency 02/19/2013  . Interstitial lung disease 02/19/2013  . BPH (benign prostatic hyperplasia) 09/04/2012  . Allergic rhinitis 09/24/2010  .  Cough, chronic 06/26/2010  . PULMONARY FIBROSIS ILD POST INFLAMMATORY CHRONIC 12/30/2009  . HYPOTHYROIDISM 12/29/2009  . HYPERLIPIDEMIA 12/29/2009  . CORONARY HEART DISEASE 12/29/2009    Medications Prior to Visit Current Outpatient Prescriptions on File Prior to Visit  Medication Sig Dispense Refill  . albuterol (PROVENTIL) (2.5 MG/3ML) 0.083% nebulizer solution Take 2.5 mg by nebulization every 6 (six) hours as needed.      Marland Kitchen. aspirin 325 MG tablet Take 325 mg by mouth daily.      . Cholecalciferol (VITAMIN D) 2000 UNITS CAPS Take 2,000-4,000 Units by mouth daily. Takes 2000 units everyday except for Fri, Sat, and Sun patient takes 4000 units.      Marland Kitchen. levothyroxine (SYNTHROID, LEVOTHROID) 125 MCG tablet TAKE ONE TABLET BY MOUTH EVERY DAY EXCEPT TAKE ONE-HALF TABLET BY MOUTH ON SATURDAY AND SUNDAY  90 tablet  2  . Levothyroxine Sodium 25 MCG CAPS Take 1 capsule by mouth daily before breakfast. Mon, wed, fri      . mycophenolate (CELLCEPT) 500 MG tablet 1,000 mg 2 (two) times daily.       Marland Kitchen. omeprazole (PRILOSEC) 20 MG capsule Take 1 capsule by mouth daily.      . predniSONE (DELTASONE) 20 MG tablet Take 10 mg by mouth daily.       . simvastatin (ZOCOR) 80 MG tablet Take 1 tablet (80 mg total) by mouth daily.  90 tablet  2  . sulfamethoxazole-trimethoprim (BACTRIM DS) 800-160 MG per tablet Mon, wed, fri- ONLY      . terazosin (HYTRIN) 10 MG capsule  TAKE ONE CAPSULE BY MOUTH ONCE DAILY  90 capsule  2   No current facility-administered medications on file prior to visit.    Current Medications (verified) Current Outpatient Prescriptions  Medication Sig Dispense Refill  . albuterol (PROVENTIL) (2.5 MG/3ML) 0.083% nebulizer solution Take 2.5 mg by nebulization every 6 (six) hours as needed.      Marland Kitchen. aspirin 325 MG tablet Take 325 mg by mouth daily.      . Cholecalciferol (VITAMIN D) 2000 UNITS CAPS Take 2,000-4,000 Units by mouth daily. Takes 2000 units everyday except for Fri, Sat, and Sun patient takes 4000 units.      Marland Kitchen. levothyroxine (SYNTHROID, LEVOTHROID) 125 MCG tablet TAKE ONE TABLET BY MOUTH EVERY DAY EXCEPT TAKE ONE-HALF TABLET BY MOUTH ON SATURDAY AND SUNDAY  90 tablet  2  . Levothyroxine Sodium 25 MCG CAPS Take 1 capsule by mouth daily before breakfast. Mon, wed, fri      . mycophenolate (CELLCEPT) 500 MG tablet 1,000 mg 2 (two) times daily.       Marland Kitchen. omeprazole (PRILOSEC) 20 MG capsule Take 1 capsule by mouth daily.      . predniSONE (DELTASONE) 20 MG tablet Take 10 mg by mouth daily.       . simvastatin (ZOCOR) 80 MG tablet Take 1 tablet (80 mg total) by mouth daily.  90 tablet  2  .  sulfamethoxazole-trimethoprim (BACTRIM DS) 800-160 MG per tablet Mon, wed, fri- ONLY      . terazosin (HYTRIN) 10 MG capsule TAKE ONE CAPSULE BY MOUTH ONCE DAILY  90 capsule  2   No current facility-administered medications for this visit.     Allergies (verified) Codeine and Levaquin   PAST HISTORY  Family History No family history on file.  Social History History  Substance Use Topics  . Smoking status: Former Smoker -- 2.00 packs/day for 30 years    Quit date: 03/29/1980  . Smokeless tobacco: Not on file  . Alcohol Use: No    Are there smokers in your home (other than you)?  No  Risk Factors Current exercise habits: Gym/ health club routine includes light weights, stationary bike, treadmill and works out with [place] (PT for [employer]).  Dietary issues discussed: none   Cardiac risk factors: advanced age (older than 47 for men, 66 for women), dyslipidemia, family history of premature cardiovascular disease and male gender.  Depression Screen (Note: if answer to either of the following is "Yes", a more complete depression screening is indicated)   Q1: Over the past two weeks, have you felt down, depressed or hopeless? No  Q2: Over the past two weeks, have you felt little interest or pleasure in doing things? No  Have you lost interest or pleasure in daily life? No  Do you often feel hopeless? No  Do you cry easily over simple problems? No  Activities of Daily Living In your present state of health, do you have any difficulty performing the following activities?:  Driving? No Managing money?  No Feeding yourself? No Getting from bed to chair? No Climbing a flight of stairs? Yes Preparing food and eating?: No Bathing or showering? No Getting dressed: No Getting to the toilet? No Using the toilet:No Moving around from place to place: No In the past year have you fallen or had a near fall?:Yes   Are you sexually active?  No  Do you have more than one partner?  No  Hearing Difficulties: No Do you often ask people to speak up or repeat themselves? No Do you experience ringing or noises in your ears? No Do you have difficulty understanding soft or whispered voices? No   Do you feel that you have a problem with memory? No  Do you often misplace items? No  Do you feel safe at home?  Yes  Cognitive Testing  Alert? Yes  Normal Appearance?Yes  Oriented to person? Yes  Place? Yes   Time? Yes  Recall of three objects?  Yes  Can perform simple calculations? Yes  Displays appropriate judgment?Yes  Can read the correct time from a watch face?Yes   Advanced Directives have been discussed with the patient? Yes   List the Names of Other Physician/Practitioners you currently use: 1.  Pulmonologist at [facility] 2.   [facility] - Dr Elizebeth Brooking any recent Medical Services you may have received from other than [facility] providers in the past year (date may be approximate).  Immunization History  Administered Date(s) Administered  . Influenza Whole 12/09/2009, 12/28/2010  . Pneumococcal Conjugate-13 02/19/2013  . Pneumococcal Polysaccharide-23 12/29/1998  . Td 03/29/2005  . Zoster 09/17/2010    Screening Tests Health Maintenance  Topic Date Due  . Influenza Vaccine  10/27/2013  . Tetanus/tdap  03/30/2015  . Colonoscopy  10/27/2017  . Pneumococcal Polysaccharide Vaccine Age 78 And Over  Completed  . Zostavax  Completed  Has never has DEXA and on long term prednisone therapy Last eye exam was earlier this year - requesting records from Dr Irwin BrakemanLe's office  All answers were reviewed with the patient and necessary referrals were made:  Henrene Pastorckard, Andretta Ergle, [facility]   08/09/2013   History reviewed: allergies, current medications, past family history, past medical history, past social history, past surgical history and problem list   Objective:      There were no vitals taken for this visit. There is no weight on file to calculate BMI.      Assessment:     Annual Medicare Wellness Visit     Plan:     During the course of the visit the patient was educated and counseled about appropriate screening and preventive services including:    Pneumococcal vaccine   Influenza vaccine  Hepatitis B vaccine  Td vaccine  Prostate cancer screening  Colorectal cancer screening  Diabetes screening  Glaucoma screening  Nutrition counseling   Advanced directives: discussed with patient - he already has packet with information at home  contacted Dr Irwin BrakemanLe's office and requested copy of last eye exam  Diet review for nutrition referral? Not indicated Orders Placed This Encounter  Procedures  . DG Bone Density    Order Specific Question:  Reason for Exam (SYMPTOM  OR DIAGNOSIS REQUIRED)     Answer:  long term prednisone use    Order Specific Question:  Preferred imaging location?    Answer:  Internal     Patient Instructions (the written plan) was given to the patient.  Medicare Attestation I have personally reviewed: The patient's medical and social history Their use of alcohol, tobacco or illicit drugs Their current medications and supplements The patient's functional ability including ADLs,fall risks, home safety risks, cognitive, and hearing and visual impairment Diet and physical activities Evidence for depression or mood disorders  The patient's weight, height, BMI, and BP/HR have been recorded in the chart.  I have made referrals, counseling, and provided education to the patient based on review of the above and I have provided the patient with a written personalized care plan for preventive services.     Henrene Pastorckard, Bronwyn Belasco, [facility]   08/09/2013

## 2013-08-09 NOTE — Patient Instructions (Addendum)
Health Maintenance Summary    INFLUENZA VACCINE Next Due 10/27/2013  Last Fall 2014    TETANUS/TDAP Next Due 03/30/2015  Last 2007   Pneumonia Completed   Last 01/2013   DEXA / Bone density Due Now Ordered today    Shingles / Zoster Completed  Last 08/2010          COLONOSCOPY Next Due 10/27/2017  Last 2009        Preventive Care for Adults, Male A healthy lifestyle and preventive care can promote health and wellness. Preventive health guidelines for men include the following key practices:  A routine yearly physical is a good way to check with your health care provider about your health and preventative screening. It is a chance to share any concerns and updates on your health and to receive a thorough exam.  Visit your dentist for a routine exam and preventative care every 6 months. Brush your teeth twice a day and floss once a day. Good oral hygiene prevents tooth decay and gum disease.  The frequency of eye exams is based on your age, health, family medical history, use of contact lenses, and other factors. Follow your health care provider's recommendations for frequency of eye exams.  Eat a healthy diet. Foods such as vegetables, fruits, whole grains, low-fat dairy products, and lean protein foods contain the nutrients you need without too many calories. Decrease your intake of foods high in solid fats, added sugars, and salt. Eat the right amount of calories for you.Get information about a proper diet from your health care provider, if necessary.  Regular physical exercise is one of the most important things you can do for your health. Most adults should get at least 150 minutes of moderate-intensity exercise (any activity that increases your heart rate and causes you to sweat) each week. In addition, most adults need muscle-strengthening exercises on 2 or more days a week.  Maintain a healthy weight. The body mass index (BMI) is a screening tool to identify possible weight problems. It  provides an estimate of body fat based on height and weight. Your health care provider can find your BMI and can help you achieve or maintain a healthy weight.For adults 20 years and older:  A BMI below 18.5 is considered underweight.  A BMI of 18.5 to 24.9 is normal.  A BMI of 25 to 29.9 is considered overweight.  A BMI of 30 and above is considered obese.  Maintain normal blood lipids and cholesterol levels by exercising and minimizing your intake of saturated fat. Eat a balanced diet with plenty of fruit and vegetables. Blood tests for lipids and cholesterol should begin at age 67 and be repeated every 5 years. If your lipid or cholesterol levels are high, you are over 50, or you are at high risk for heart disease, you may need your cholesterol levels checked more frequently.Ongoing high lipid and cholesterol levels should be treated with medicines if diet and exercise are not working.  If you smoke, find out from your health care provider how to quit. If you do not use tobacco, do not start.  Lung cancer screening is recommended for adults aged 17 80 years who are at high risk for developing lung cancer because of a history of smoking. A yearly low-dose CT scan of the lungs is recommended for people who have at least a 30-pack-year history of smoking and are a current smoker or have quit within the past 15 years. A pack year of smoking is  smoking an average of 1 pack of cigarettes a day for 1 year (for example: 1 pack a day for 30 years or 2 packs a day for 15 years). Yearly screening should continue until the smoker has stopped smoking for at least 15 years. Yearly screening should be stopped for people who develop a health problem that would prevent them from having lung cancer treatment.  If you choose to drink alcohol, do not have more than 2 drinks per day. One drink is considered to be 12 ounces (355 mL) of beer, 5 ounces (148 mL) of wine, or 1.5 ounces (44 mL) of liquor.  Avoid use of  street drugs. Do not share needles with anyone. Ask for help if you need support or instructions about stopping the use of drugs.  High blood pressure causes heart disease and increases the risk of stroke. Your blood pressure should be checked at least every 1 2 years. Ongoing high blood pressure should be treated with medicines, if weight loss and exercise are not effective.  If you are 67 78 years old, ask your health care provider if you should take aspirin to prevent heart disease.  Diabetes screening involves taking a blood sample to check your fasting blood sugar level. This should be done once every 3 years, after age 44, if you are within normal weight and without risk factors for diabetes. Testing should be considered at a younger age or be carried out more frequently if you are overweight and have at least 1 risk factor for diabetes.  Colorectal cancer can be detected and often prevented. Most routine colorectal cancer screening begins at the age of 59 and continues through age 51. However, your health care provider may recommend screening at an earlier age if you have risk factors for colon cancer. On a yearly basis, your health care provider may provide home test kits to check for hidden blood in the stool. Use of a small camera at the end of a tube to directly examine the colon (sigmoidoscopy or colonoscopy) can detect the earliest forms of colorectal cancer. Talk to your health care provider about this at age 70, when routine screening begins. Direct exam of the colon should be repeated every 5 10 years through age 83, unless early forms of precancerous polyps or small growths are found.  People who are at an increased risk for hepatitis B should be screened for this virus. You are considered at high risk for hepatitis B if:  You were born in a country where hepatitis B occurs often. Talk with your health care provider about which countries are considered high-risk.  Your parents were  born in a high-risk country and you have not received a shot to protect against hepatitis B (hepatitis B vaccine).  You have HIV or AIDS.  You use needles to inject street drugs.  You live with, or have sex with, someone who has hepatitis B.  You are a man who has sex with other men (MSM).  You get hemodialysis treatment.  You take certain medicines for conditions such as cancer, organ transplantation, and autoimmune conditions.  Hepatitis C blood testing is recommended for all people born from [birth year] through 1965 and any individual with known risks for hepatitis C.  Practice safe sex. Use condoms and avoid high-risk sexual practices to reduce the spread of sexually transmitted infections (STIs). STIs include gonorrhea, chlamydia, syphilis, trichomonas, herpes, HPV, and human immunodeficiency virus (HIV). Herpes, HIV, and HPV are viral illnesses that have no  cure. They can result in disability, cancer, and death.  A one-time screening for abdominal aortic aneurysm (AAA) and surgical repair of large AAAs by ultrasound are recommended for men ages 13 to 6 years who are current or former smokers.  Healthy men should no longer receive prostate-specific antigen (PSA) blood tests as part of routine cancer screening. Talk with your health care provider about prostate cancer screening.  Testicular cancer screening is not recommended for adult males who have no symptoms. Screening includes self-exam, a health care provider exam, and other screening tests. Consult with your health care provider about any symptoms you have or any concerns you have about testicular cancer.  Use sunscreen. Apply sunscreen liberally and repeatedly throughout the day. You should seek shade when your shadow is shorter than you. Protect yourself by wearing long sleeves, pants, a wide-brimmed hat, and sunglasses year round, whenever you are outdoors.  Once a month, do a whole-body skin exam, using a mirror to look at the skin  on your back. Tell your health care provider about new moles, moles that have irregular borders, moles that are larger than a pencil eraser, or moles that have changed in shape or color.  Stay current with required vaccines (immunizations).  Influenza vaccine. All adults should be immunized every year.  Tetanus, diphtheria, and acellular pertussis (Td, Tdap) vaccine. An adult who has not previously received Tdap or who does not know his vaccine status should receive 1 dose of Tdap. This initial dose should be followed by tetanus and diphtheria toxoids (Td) booster doses every 10 years. Adults with an unknown or incomplete history of completing a 3-dose immunization series with Td-containing vaccines should begin or complete a primary immunization series including a Tdap dose. Adults should receive a Td booster every 10 years..  Zoster vaccine. One dose is recommended for adults aged 24 years or older unless certain conditions are present.  Measles, mumps, and rubella (MMR) vaccine. Adults born before [birth year] generally are considered immune to measles and mumps. Adults born in [birth year] or later should have 1 or more doses of MMR vaccine unless there is a contraindication to the vaccine or there is laboratory evidence of immunity to each of the three diseases. A routine second dose of MMR vaccine should be obtained at least 28 days after the first dose for students attending postsecondary schools, health care workers, or international travelers. People who received inactivated measles vaccine or an unknown type of measles vaccine during 1963 1967 should receive 2 doses of MMR vaccine. People who received inactivated mumps vaccine or an unknown type of mumps vaccine before 1979 and are at high risk for mumps infection should consider immunization with 2 doses of MMR vaccine. Unvaccinated health care workers born before [birth year] who lack laboratory evidence of measles, mumps, or rubella immunity or laboratory  confirmation of disease should consider measles and mumps immunization with 2 doses of MMR vaccine or rubella immunization with 1 dose of MMR vaccine.  Pneumococcal 13-valent conjugate (PCV13) vaccine. When indicated, a person who is uncertain of his immunization history and has no record of immunization should receive the PCV13 vaccine. An adult aged 55 years or older who has certain medical conditions and has not been previously immunized should receive 1 dose of PCV13 vaccine. This PCV13 should be followed with a dose of pneumococcal polysaccharide (PPSV23) vaccine. The PPSV23 vaccine dose should be obtained at least 8 weeks after the dose of PCV13 vaccine. An adult aged 23 years or older who  has certain medical conditions and previously received 1 or more doses of PPSV23 vaccine should receive 1 dose of PCV13. The PCV13 vaccine dose should be obtained 1 or more years after the last PPSV23 vaccine dose.  Pneumococcal polysaccharide (PPSV23) vaccine. When PCV13 is also indicated, PCV13 should be obtained first. All adults aged 60 years and older should be immunized. An adult younger than age 26 years who has certain medical conditions should be immunized. Any person who resides in a nursing home or long-term care facility should be immunized. An adult smoker should be immunized. People with an immunocompromised condition and certain other conditions should receive both PCV13 and PPSV23 vaccines. People with human immunodeficiency virus (HIV) infection should be immunized as soon as possible after diagnosis. Immunization during chemotherapy or radiation therapy should be avoided. Routine use of PPSV23 vaccine is not recommended for American Indians, [place] Natives, or people younger than 65 years unless there are medical conditions that require PPSV23 vaccine. When indicated, people who have unknown immunization and have no record of immunization should receive PPSV23 vaccine. One-time revaccination 5 years  after the first dose of PPSV23 is recommended for people aged 53 64 years who have chronic kidney failure, nephrotic syndrome, asplenia, or immunocompromised conditions. People who received 1 2 doses of PPSV23 before age 36 years should receive another dose of PPSV23 vaccine at age 47 years or later if at least 5 years have passed since the previous dose. Doses of PPSV23 are not needed for people immunized with PPSV23 at or after age 73 years. Hepatitis A vaccine. Adults who wish to be protected from this disease, have certain high-risk conditions, work with hepatitis A-infected animals, work in hepatitis A research labs, or travel to or work in countries with a high rate of hepatitis A should be immunized. Adults who were previously unvaccinated and who anticipate close contact with an international adoptee during the first 60 days after arrival in the [place] from a country with a high rate of hepatitis A should be immunized.  Hepatitis B vaccine. Adults who wish to be protected from this disease, have certain high-risk conditions, may be exposed to blood or other infectious body fluids, are household contacts or sex partners of hepatitis B positive people, are clients or workers in certain care facilities, or travel to or work in countries with a high rate of hepatitis B should be immunized.  Ages 55 and over  Blood pressure check.** / Every 1 to 2 years.  Lipid and cholesterol check.**/ Every 5 years beginning at age 74.  Lung cancer screening. / Every year if you are aged 34 80 years and have a 30-pack-year history of smoking and currently smoke or have quit within the past 15 years. Yearly screening is stopped once you have quit smoking for at least 15 years or develop a health problem that would prevent you from having lung cancer treatment.  Fecal occult blood test (FOBT) of stool. / Every year beginning at age 27 and continuing until age 30. You may not have to do this test if you get a  colonoscopy every 10 years.  Flexible sigmoidoscopy** or colonoscopy.** / Every 5 years for a flexible sigmoidoscopy or every 10 years for a colonoscopy beginning at age 49 and continuing until age 56.  Hepatitis C blood test.** / For all people born from [birth year] through 1965 and any individual with known risks for hepatitis C.  Abdominal aortic aneurysm (AAA) screening.** / A one-time screening for ages  65 to 75 years who are current or former smokers.  Skin self-exam. / Monthly.  Influenza vaccine. / Every year.  Tetanus, diphtheria, and acellular pertussis (Tdap/Td) vaccine.** / 1 dose of Td every 10 years.  Varicella vaccine.** / Consult your health care provider.  Zoster vaccine.** / 1 dose for adults aged 90 years or older.  Pneumococcal 13-valent conjugate (PCV13) vaccine.** / Consult your health care provider.  Pneumococcal polysaccharide (PPSV23) vaccine.** / 1 dose for all adults aged 6 years and older.  Meningococcal vaccine.** / Consult your health care provider.  Hepatitis A vaccine.** / Consult your health care provider.  Hepatitis B vaccine.** / Consult your health care provider.  Haemophilus influenzae type b (Hib) vaccine.** / Consult your health care provider. **Family history and personal history of risk and conditions may change your health care provider's recommendations. Document Released: 05/11/2001 Document Revised: 01/03/2013 Document Reviewed: 08/10/2010 [facility] Patient Information 2014 [place], [place].

## 2013-09-13 ENCOUNTER — Encounter: Payer: Self-pay | Admitting: *Deleted

## 2013-10-30 ENCOUNTER — Other Ambulatory Visit (INDEPENDENT_AMBULATORY_CARE_PROVIDER_SITE_OTHER): Payer: Medicare Other

## 2013-10-30 DIAGNOSIS — E039 Hypothyroidism, unspecified: Secondary | ICD-10-CM

## 2013-10-30 DIAGNOSIS — E559 Vitamin D deficiency, unspecified: Secondary | ICD-10-CM

## 2013-10-30 DIAGNOSIS — R7309 Other abnormal glucose: Secondary | ICD-10-CM

## 2013-10-30 DIAGNOSIS — I10 Essential (primary) hypertension: Secondary | ICD-10-CM

## 2013-10-30 DIAGNOSIS — E785 Hyperlipidemia, unspecified: Secondary | ICD-10-CM

## 2013-10-30 DIAGNOSIS — R7989 Other specified abnormal findings of blood chemistry: Secondary | ICD-10-CM

## 2013-10-30 LAB — POCT CBC
Granulocyte percent: 78.9 %G (ref 37–80)
HEMATOCRIT: 40.5 % — AB (ref 43.5–53.7)
Hemoglobin: 13.3 g/dL — AB (ref 14.1–18.1)
LYMPH, POC: 1.6 (ref 0.6–3.4)
MCH: 29.4 pg (ref 27–31.2)
MCHC: 32.8 g/dL (ref 31.8–35.4)
MCV: 89.6 fL (ref 80–97)
MPV: 8.3 fL (ref 0–99.8)
PLATELET COUNT, POC: 223 10*3/uL (ref 142–424)
POC Granulocyte: 6.6 (ref 2–6.9)
POC LYMPH PERCENT: 19.1 %L (ref 10–50)
RBC: 4.5 M/uL — AB (ref 4.69–6.13)
RDW, POC: 14.1 %
WBC: 8.4 10*3/uL (ref 4.6–10.2)

## 2013-10-30 NOTE — Progress Notes (Signed)
Pt came in for lab  only 

## 2013-10-31 LAB — THYROID PANEL WITH TSH
Free Thyroxine Index: 4.1 (ref 1.2–4.9)
T3 Uptake Ratio: 36 % (ref 24–39)
T4, Total: 11.3 ug/dL (ref 4.5–12.0)
TSH: 0.524 u[IU]/mL (ref 0.450–4.500)

## 2013-10-31 LAB — HEPATIC FUNCTION PANEL
ALBUMIN: 4 g/dL (ref 3.5–4.7)
ALK PHOS: 51 IU/L (ref 39–117)
ALT: 16 IU/L (ref 0–44)
AST: 18 IU/L (ref 0–40)
BILIRUBIN TOTAL: 0.8 mg/dL (ref 0.0–1.2)
Bilirubin, Direct: 0.36 mg/dL (ref 0.00–0.40)
Total Protein: 6.1 g/dL (ref 6.0–8.5)

## 2013-10-31 LAB — NMR, LIPOPROFILE
Cholesterol: 168 mg/dL (ref 100–199)
HDL CHOLESTEROL BY NMR: 80 mg/dL (ref >39)
HDL PARTICLE NUMBER: 36.8 umol/L (ref >=30.5)
LDL Particle Number: 630 nmol/L (ref <1000)
LDL Size: 21 nm (ref >20.5)
LDLC SERPL CALC-MCNC: 70 mg/dL (ref 0–99)
Triglycerides by NMR: 92 mg/dL (ref 0–149)

## 2013-10-31 LAB — BMP8+EGFR
BUN/Creatinine Ratio: 17 (ref 10–22)
BUN: 16 mg/dL (ref 8–27)
CHLORIDE: 96 mmol/L — AB (ref 97–108)
CO2: 26 mmol/L (ref 18–29)
Calcium: 9.4 mg/dL (ref 8.6–10.2)
Creatinine, Ser: 0.92 mg/dL (ref 0.76–1.27)
GFR calc Af Amer: 89 mL/min/{1.73_m2} (ref >59)
GFR calc non Af Amer: 77 mL/min/{1.73_m2} (ref >59)
Glucose: 116 mg/dL — ABNORMAL HIGH (ref 65–99)
Potassium: 4 mmol/L (ref 3.5–5.2)
SODIUM: 139 mmol/L (ref 134–144)

## 2013-10-31 LAB — VITAMIN D 25 HYDROXY (VIT D DEFICIENCY, FRACTURES): Vit D, 25-Hydroxy: 35.7 ng/mL (ref 30.0–100.0)

## 2013-11-05 LAB — POCT GLYCOSYLATED HEMOGLOBIN (HGB A1C)

## 2013-11-05 NOTE — Addendum Note (Signed)
Addended by: Monica BectonHODGES, Sariyah Corcino F on: 11/05/2013 06:48 PM   Modules accepted: Orders

## 2013-11-06 ENCOUNTER — Ambulatory Visit (INDEPENDENT_AMBULATORY_CARE_PROVIDER_SITE_OTHER): Payer: Medicare Other | Admitting: Family Medicine

## 2013-11-06 ENCOUNTER — Encounter: Payer: Self-pay | Admitting: Family Medicine

## 2013-11-06 VITALS — BP 101/65 | HR 76 | Temp 99.1°F | Ht 65.0 in | Wt 179.0 lb

## 2013-11-06 DIAGNOSIS — N400 Enlarged prostate without lower urinary tract symptoms: Secondary | ICD-10-CM

## 2013-11-06 DIAGNOSIS — K429 Umbilical hernia without obstruction or gangrene: Secondary | ICD-10-CM

## 2013-11-06 DIAGNOSIS — E039 Hypothyroidism, unspecified: Secondary | ICD-10-CM

## 2013-11-06 DIAGNOSIS — E559 Vitamin D deficiency, unspecified: Secondary | ICD-10-CM

## 2013-11-06 DIAGNOSIS — E785 Hyperlipidemia, unspecified: Secondary | ICD-10-CM

## 2013-11-06 DIAGNOSIS — J8410 Pulmonary fibrosis, unspecified: Secondary | ICD-10-CM

## 2013-11-06 MED ORDER — SIMVASTATIN 80 MG PO TABS: 80.0000 mg | ORAL_TABLET | Freq: Every evening | ORAL | Status: DC

## 2013-11-06 MED ORDER — OMEPRAZOLE 20 MG PO CPDR: 20.0000 mg | DELAYED_RELEASE_CAPSULE | Freq: Every evening | ORAL | Status: DC

## 2013-11-06 MED ORDER — LEVOTHYROXINE SODIUM 25 MCG PO TABS: 25.0000 ug | ORAL_TABLET | Freq: Every day | ORAL | Status: DC

## 2013-11-06 MED ORDER — LEVOTHYROXINE SODIUM 125 MCG PO TABS: ORAL_TABLET | ORAL | Status: DC

## 2013-11-06 MED ORDER — TERAZOSIN HCL 10 MG PO CAPS: 10.0000 mg | ORAL_CAPSULE | Freq: Every day | ORAL | Status: DC

## 2013-11-06 NOTE — Patient Instructions (Addendum)
Medicare Annual Wellness Visit  [place] and the medical providers at [facility] strive to bring you the best medical care.  In doing so we not only want to address your current medical conditions and concerns but also to detect new conditions early and prevent illness, disease and health-related problems.    Medicare offers a yearly Wellness Visit which allows our clinical staff to assess your need for preventative services including immunizations, lifestyle education, counseling to decrease risk of preventable diseases and screening for fall risk and other medical concerns.    This visit is provided free of charge (no copay) for all Medicare recipients. The clinical pharmacists at [facility] have begun to conduct these Wellness Visits which will also include a thorough review of all your medications.    As you primary medical provider recommend that you make an appointment for your Annual Wellness Visit if you have not done so already this year.  You may set up this appointment before you leave today or you may call back [phone] and schedule an appointment.  Please make sure when you call that you mention that you are scheduling your Annual Wellness Visit with the clinical pharmacist so that the appointment may be made for the proper length of time.     Continue current medications. Continue good therapeutic lifestyle changes which include good diet and exercise. Fall precautions discussed with patient. If an FOBT was given today- please return it to our front desk. If you are over 78 years old - you may need Prevnar 13 or the adult Pneumonia vaccine.  Continued followup with a pulmonologist Return  the FOBT Study as active as possible Ask the pulmonologist about the need for a repeat CT scan

## 2013-11-06 NOTE — Progress Notes (Signed)
Subjective:    Patient ID: Raymond [name], male    DOB: [birth year], 80 y.o.   MRN: [id number]  HPI Pt here for follow up and management of chronic medical problems. The patient has had recent lab work done and this will be reviewed with him today at the visit. Of his lab work looked good except for a slightly decreased hemoglobin but that was consistent with past readings. An elevated blood sugar was noted at 116. A hemoglobin A 1C was done and this was normal. The patient sees the pulmonologist because of his persistent pulmonary fibrosis. He is seen by a local pulmonologist and [facility] He stays active according to his wife he gets about and he exercises frequently. He does complain of some nervousness and jitteriness. All of his prescriptions will be refilled today also. On health maintenance parameters he is due to return in FOBT and we will not get a chest x-ray because he will most likely get a CT scan indeed in October of this year.         Patient Active Problem List   Diagnosis Date Noted  . Vitamin D deficiency 02/19/2013  . Interstitial lung disease 02/19/2013  . BPH (benign prostatic hyperplasia) 09/04/2012  . Allergic rhinitis 09/24/2010  .  Cough, chronic 06/26/2010  . PULMONARY FIBROSIS ILD POST INFLAMMATORY CHRONIC 12/30/2009  . HYPOTHYROIDISM 12/29/2009  . HYPERLIPIDEMIA 12/29/2009  . CORONARY HEART DISEASE 12/29/2009   Outpatient Encounter Prescriptions as of 11/06/2013  Medication Sig  . aspirin 325 MG tablet Take 325 mg by mouth daily.  . Cholecalciferol (VITAMIN D) 2000 UNITS CAPS Take 2,000-4,000 Units by mouth daily. Takes 2000 units everyday except for Fri, Sat, and Sun patient takes 4000 units.  . citalopram (CELEXA) 20 MG tablet Take 1 tablet by mouth daily.  Marland Kitchen. levothyroxine (SYNTHROID, LEVOTHROID) 125 MCG tablet TAKE ONE TABLET BY MOUTH EVERY DAY EXCEPT TAKE ONE-HALF TABLET BY MOUTH ON SATURDAY AND SUNDAY  . levothyroxine (SYNTHROID,  LEVOTHROID) 25 MCG tablet Take 25 mcg by mouth daily before breakfast.  . mycophenolate (CELLCEPT) 500 MG tablet Take 2 tablets by mouth 2 (two) times daily.  Marland Kitchen. omeprazole (PRILOSEC) 20 MG capsule Take 1 capsule by mouth every evening.   . predniSONE (DELTASONE) 10 MG tablet Take 10 mg by mouth daily.  . simvastatin (ZOCOR) 80 MG tablet Take 80 mg by mouth every evening.  . sulfamethoxazole-trimethoprim (BACTRIM DS) 800-160 MG per tablet Mon, wed, fri- ONLY  . terazosin (HYTRIN) 10 MG capsule Take 10 mg by mouth at bedtime. TAKE ONE CAPSULE BY MOUTH ONCE DAILY  . [DISCONTINUED] albuterol (PROVENTIL) (2.5 MG/3ML) 0.083% nebulizer solution Take 2.5 mg by nebulization every 6 (six) hours as needed.  . [DISCONTINUED] sulfamethoxazole-trimethoprim (BACTRIM DS) 800-160 MG per tablet Take 1 tablet by mouth daily.  . [DISCONTINUED] mycophenolate (CELLCEPT) 500 MG tablet 1,000 mg 2 (two) times daily.     Review of Systems  Constitutional: Negative.   HENT: Negative.   Eyes: Negative.   Respiratory: Negative.   Cardiovascular: Negative.   Gastrointestinal: Negative.   Endocrine: Negative.   Genitourinary: Negative.   Musculoskeletal: Negative.   Skin: Negative.   Allergic/Immunologic: Negative.   Neurological: Negative.        Nervousness and jittery   Hematological: Negative.   Psychiatric/Behavioral: Negative.        Objective:   Physical Exam  Nursing note and vitals reviewed. Constitutional: He is oriented to person, place, and time. He appears  well-developed and well-nourished. No distress.  HENT:  Head: Normocephalic and atraumatic.  Right Ear: External ear normal.  Left Ear: External ear normal.  Mouth/Throat: Oropharynx is clear and moist. No oropharyngeal exudate.  Irritated blood vessel right nasal septum  Eyes: Conjunctivae and EOM are normal. Pupils are equal, round, and reactive to light. Right eye exhibits no discharge. Left eye exhibits no discharge. No scleral icterus.    Neck: Normal range of motion. Neck supple. No thyromegaly present.  Cardiovascular: Normal rate, regular rhythm, normal heart sounds and intact distal pulses.   No murmur heard. Pulmonary/Chest: Effort normal. No respiratory distress. He has wheezes. He has no rales. He exhibits no tenderness.   There are scattered wheezes and a few crackles but otherwise good breath sounds   Abdominal: Soft. Bowel sounds are normal. He exhibits no mass. There is no tenderness. There is no rebound and no guarding.  There is a small umbilical hernia but it has not increased in size from the previous visit  Genitourinary: Rectum normal and penis normal.  The prostate is enlarged but soft and smooth and there are no rectal masses. There are no inguinal hernias. There is no inguinal nodes. The external genitalia were normal  Musculoskeletal: Normal range of motion. He exhibits no edema and no tenderness.  Lymphadenopathy:    He has no cervical adenopathy.  Neurological: He is alert and oriented to person, place, and time. He has normal reflexes. No cranial nerve deficit.  Skin: Skin is warm and dry. No rash noted. No erythema. No pallor.  The patient has been scanned and multiple bruises secondary taking prednisone appear  Psychiatric: He has a normal mood and affect. His behavior is normal. Judgment and thought content normal.  His mood and affect are positive   BP 101/65  Pulse 76  Temp(Src) 99.1 F (37.3 C) (Oral)  Ht 5\' 5"  (1.651 m)  Wt 179 lb (81.194 kg)  BMI 29.79 kg/m2        Assessment & Plan:  1. BPH (benign prostatic hyperplasia)  2. HYPERLIPIDEMIA  3. HYPOTHYROIDISM  4. Vitamin D deficiency  5. Postinflammatory pulmonary fibrosis  6. Umbilical hernia, recurrence not specified   Meds ordered this encounter  Medications  . citalopram (CELEXA) 20 MG tablet    Sig: Take 1 tablet by mouth daily.  Marland Kitchen DISCONTD: sulfamethoxazole-trimethoprim (BACTRIM DS) 800-160 MG per tablet    Sig:  Take 1 tablet by mouth daily.  . mycophenolate (CELLCEPT) 500 MG tablet    Sig: Take 2 tablets by mouth 2 (two) times daily.  Marland Kitchen terazosin (HYTRIN) 10 MG capsule    Sig: Take 1 capsule (10 mg total) by mouth at bedtime.    Dispense:  90 capsule    Refill:  3  . simvastatin (ZOCOR) 80 MG tablet    Sig: Take 1 tablet (80 mg total) by mouth every evening.    Dispense:  90 tablet    Refill:  3  . levothyroxine (SYNTHROID, LEVOTHROID) 125 MCG tablet    Sig: TAKE ONE TABLET BY MOUTH EVERY DAY as directed    Dispense:  90 tablet    Refill:  3  . levothyroxine (SYNTHROID, LEVOTHROID) 25 MCG tablet    Sig: Take 1 tablet (25 mcg total) by mouth daily before breakfast.    Dispense:  90 tablet    Refill:  3  . omeprazole (PRILOSEC) 20 MG capsule    Sig: Take 1 capsule (20 mg total) by mouth every evening.  Dispense:  90 capsule    Refill:  3   Patient Instructions                       Medicare Annual Wellness Visit  [place] and the medical providers at [facility] strive to bring you the best medical care.  In doing so we not only want to address your current medical conditions and concerns but also to detect new conditions early and prevent illness, disease and health-related problems.    Medicare offers a yearly Wellness Visit which allows our clinical staff to assess your need for preventative services including immunizations, lifestyle education, counseling to decrease risk of preventable diseases and screening for fall risk and other medical concerns.    This visit is provided free of charge (no copay) for all Medicare recipients. The clinical pharmacists at [facility] have begun to conduct these Wellness Visits which will also include a thorough review of all your medications.    As you primary medical provider recommend that you make an appointment for your Annual Wellness Visit if you have not done so already this year.  You may set up  this appointment before you leave today or you may call back ([phone]) and schedule an appointment.  Please make sure when you call that you mention that you are scheduling your Annual Wellness Visit with the clinical pharmacist so that the appointment may be made for the proper length of time.     Continue current medications. Continue good therapeutic lifestyle changes which include good diet and exercise. Fall precautions discussed with patient. If an FOBT was given today- please return it to our front desk. If you are over 76 years old - you may need Prevnar 13 or the adult Pneumonia vaccine.  Continued followup with a pulmonologist Return  the FOBT Study as active as possible Ask the pulmonologist about the need for a repeat CT scan   Nyra Capes MD

## 2013-11-07 ENCOUNTER — Ambulatory Visit: Payer: Medicare Other | Admitting: Family Medicine

## 2013-11-09 ENCOUNTER — Ambulatory Visit: Payer: Medicare Other | Admitting: Family Medicine

## 2014-03-13 ENCOUNTER — Ambulatory Visit (INDEPENDENT_AMBULATORY_CARE_PROVIDER_SITE_OTHER): Payer: Medicare Other | Admitting: Family Medicine

## 2014-03-13 ENCOUNTER — Ambulatory Visit (INDEPENDENT_AMBULATORY_CARE_PROVIDER_SITE_OTHER): Payer: Medicare Other

## 2014-03-13 ENCOUNTER — Encounter: Payer: Self-pay | Admitting: Family Medicine

## 2014-03-13 VITALS — BP 100/51 | HR 97 | Temp 96.6°F | Ht 65.0 in | Wt 180.0 lb

## 2014-03-13 DIAGNOSIS — R0602 Shortness of breath: Secondary | ICD-10-CM

## 2014-03-13 DIAGNOSIS — Z79899 Other long term (current) drug therapy: Secondary | ICD-10-CM

## 2014-03-13 DIAGNOSIS — N400 Enlarged prostate without lower urinary tract symptoms: Secondary | ICD-10-CM

## 2014-03-13 DIAGNOSIS — E559 Vitamin D deficiency, unspecified: Secondary | ICD-10-CM

## 2014-03-13 DIAGNOSIS — E785 Hyperlipidemia, unspecified: Secondary | ICD-10-CM

## 2014-03-13 DIAGNOSIS — E039 Hypothyroidism, unspecified: Secondary | ICD-10-CM

## 2014-03-13 DIAGNOSIS — J8410 Pulmonary fibrosis, unspecified: Secondary | ICD-10-CM

## 2014-03-13 LAB — POCT CBC
Granulocyte percent: 69 %G (ref 37–80)
HCT, POC: 38.6 % — AB (ref 43.5–53.7)
Hemoglobin: 11.9 g/dL — AB (ref 14.1–18.1)
Lymph, poc: 1.9 (ref 0.6–3.4)
MCH, POC: 27.2 pg (ref 27–31.2)
MCHC: 31 g/dL — AB (ref 31.8–35.4)
MCV: 87.8 fL (ref 80–97)
MPV: 7.9 fL (ref 0–99.8)
POC GRANULOCYTE: 5.5 (ref 2–6.9)
POC LYMPH PERCENT: 23.8 %L (ref 10–50)
Platelet Count, POC: 258 10*3/uL (ref 142–424)
RBC: 4.4 M/uL — AB (ref 4.69–6.13)
RDW, POC: 13.8 %
WBC: 8 10*3/uL (ref 4.6–10.2)

## 2014-03-13 MED ORDER — LEVOTHYROXINE SODIUM 25 MCG PO TABS: 25.0000 ug | ORAL_TABLET | Freq: Every day | ORAL | Status: DC

## 2014-03-13 NOTE — Progress Notes (Signed)
Subjective:    Patient ID: Raymond [name], male    DOB: 07/03/1929, 80 y.o.   MRN: [id number]  HPI Pt here for follow up and management of chronic medical problems. This patient has a history of pulmonary fibrosis and he is followed by the pulmonologist on a regular basis. He does complain of more swelling in his ankles. He recently went off of his prednisone and is now back on his prednisone again.         Patient Active Problem List   Diagnosis Date Noted  . Vitamin D deficiency 02/19/2013  . Interstitial lung disease 02/19/2013  . BPH (benign prostatic hyperplasia) 09/04/2012  . Allergic rhinitis 09/24/2010  .  Cough, chronic 06/26/2010  . PULMONARY FIBROSIS ILD POST INFLAMMATORY CHRONIC 12/30/2009  . HYPOTHYROIDISM 12/29/2009  . HYPERLIPIDEMIA 12/29/2009  . CORONARY HEART DISEASE 12/29/2009   Outpatient Encounter Prescriptions as of 03/13/2014  Medication Sig  . Cholecalciferol (VITAMIN D) 2000 UNITS CAPS Take 2,000-4,000 Units by mouth daily. Takes 2000 units everyday except for Fri, Sat, and Sun patient takes 4000 units.  . citalopram (CELEXA) 20 MG tablet Take 1 tablet by mouth daily.  Marland Kitchen levothyroxine (SYNTHROID, LEVOTHROID) 125 MCG tablet TAKE ONE TABLET BY MOUTH EVERY DAY as directed  . levothyroxine (SYNTHROID, LEVOTHROID) 25 MCG tablet Take 1 tablet (25 mcg total) by mouth daily before breakfast.  . mycophenolate (CELLCEPT) 500 MG tablet Take 2 tablets by mouth 2 (two) times daily.  Marland Kitchen omeprazole (PRILOSEC) 20 MG capsule Take 1 capsule (20 mg total) by mouth every evening.  . predniSONE (DELTASONE) 10 MG tablet Take 5 mg by mouth daily.   . simvastatin (ZOCOR) 80 MG tablet Take 1 tablet (80 mg total) by mouth every evening.  . sulfamethoxazole-trimethoprim (BACTRIM DS) 800-160 MG per tablet Mon, wed, fri- ONLY  . terazosin (HYTRIN) 10 MG capsule Take 1 capsule (10 mg total) by mouth at bedtime.  . [DISCONTINUED] aspirin 325 MG tablet Take 325 mg by mouth daily.      Review of Systems  Constitutional: Negative.   HENT: Negative.   Eyes: Negative.   Respiratory: Negative.   Cardiovascular: Positive for leg swelling (bilat ankle swelling).  Gastrointestinal: Negative.   Endocrine: Negative.   Genitourinary: Negative.   Musculoskeletal: Negative.   Skin: Negative.   Allergic/Immunologic: Negative.   Neurological: Negative.   Hematological: Negative.   Psychiatric/Behavioral: Negative.        Objective:   Physical Exam  Constitutional: He is oriented to person, place, and time. He appears well-developed and well-nourished. No distress.  The patient is alert and pleasant. He is wearing his and using his portable O2.  HENT:  Head: Normocephalic and atraumatic.  Right Ear: External ear normal.  Left Ear: External ear normal.  Mouth/Throat: Oropharynx is clear and moist. No oropharyngeal exudate.  There is nasal congestion bilaterally right greater than left  Eyes: Conjunctivae and EOM are normal. Pupils are equal, round, and reactive to light. Right eye exhibits no discharge. Left eye exhibits no discharge. No scleral icterus.  Neck: Normal range of motion. Neck supple. No thyromegaly present.  Neck without adenopathy or bruits  Cardiovascular: Normal rate, regular rhythm, normal heart sounds and intact distal pulses.  Exam reveals no gallop and no friction rub.   No murmur heard. At 72/m  Pulmonary/Chest: Effort normal. No respiratory distress. He has no wheezes. He has no rales. He exhibits no tenderness.  There are crackles in each lung  and diminished breath sounds  in the right lung base posteriorly.  Abdominal: Soft. Bowel sounds are normal. He exhibits no mass. There is no tenderness. There is no rebound and no guarding.  There is a small umbilical hernia present that is giving the patient was at this time.  Musculoskeletal: Normal range of motion. He exhibits no edema or tenderness.  Lymphadenopathy:    He has no cervical adenopathy.   Neurological: He is alert and oriented to person, place, and time. He has normal reflexes. No cranial nerve deficit.  Skin: Skin is warm and dry. No rash noted. No erythema. No pallor.  Psychiatric: He has a normal mood and affect. His behavior is normal. Judgment and thought content normal.  Nursing note and vitals reviewed.  BP 100/51 mmHg  Pulse 97  Temp(Src) 96.6 F (35.9 C) (Oral)  Ht $R'5\' 5"'oX$  (1.651 m)  Wt 180 lb (81.647 kg)  BMI 29.95 kg/m2  [facility] reading (PRIMARY) by  Dr. Brunilda Payor x-ray-   changes of pulmonary fibrosis                                    Assessment & Plan:  1. BPH (benign prostatic hyperplasia) - POCT CBC  2. Vitamin D deficiency - POCT CBC - Vit D  25 hydroxy (rtn osteoporosis monitoring)  3. High risk medication use - POCT CBC - BMP8+EGFR - Hepatic function panel - NMR, lipoprofile - Vit D  25 hydroxy (rtn osteoporosis monitoring) - Thyroid Panel With TSH - DG Chest 2 View; Future  4. Hypothyroidism, unspecified hypothyroidism type - POCT CBC - Thyroid Panel With TSH  5. Hyperlipidemia - POCT CBC - BMP8+EGFR - Hepatic function panel - NMR, lipoprofile  6. SOB (shortness of breath) - DG Chest 2 View; Future  7. Pulmonary fibrosis   Meds ordered this encounter  Medications  . levothyroxine (SYNTHROID, LEVOTHROID) 25 MCG tablet    Sig: Take 1 tablet (25 mcg total) by mouth daily before breakfast.    Dispense:  90 tablet    Refill:  3   Patient Instructions                       Medicare Annual Wellness Visit  [place] and the medical providers at [facility] strive to bring you the best medical care.  In doing so we not only want to address your current medical conditions and concerns but also to detect new conditions early and prevent illness, disease and health-related problems.    Medicare offers a yearly Wellness Visit which allows our clinical staff to assess your need for preventative services  including immunizations, lifestyle education, counseling to decrease risk of preventable diseases and screening for fall risk and other medical concerns.    This visit is provided free of charge (no copay) for all Medicare recipients. The clinical pharmacists at [facility] have begun to conduct these Wellness Visits which will also include a thorough review of all your medications.    As you primary medical provider recommend that you make an appointment for your Annual Wellness Visit if you have not done so already this year.  You may set up this appointment before you leave today or you may call back ([phone]) and schedule an appointment.  Please make sure when you call that you mention that you are scheduling your Annual Wellness Visit with the clinical pharmacist so that the  appointment may be made for the proper length of time.     Continue current medications. Continue good therapeutic lifestyle changes which include good diet and exercise. Fall precautions discussed with patient. If an FOBT was given today- please return it to our front desk. If you are over 37 years old - you may need Prevnar 102 or the adult Pneumonia vaccine.  Flu Shots will be available at our office starting mid- September. Please call and schedule a FLU CLINIC APPOINTMENT.   Elevate feet when possible Try to reduce the sodium in your diet Continue to follow-up with Dr. Luan Pulling and Dr. Lauris Chroman, pulmonologist    Arrie Senate MD

## 2014-03-13 NOTE — Patient Instructions (Addendum)
Medicare Annual Wellness Visit  [place] and the medical providers at [facility] strive to bring you the best medical care.  In doing so we not only want to address your current medical conditions and concerns but also to detect new conditions early and prevent illness, disease and health-related problems.    Medicare offers a yearly Wellness Visit which allows our clinical staff to assess your need for preventative services including immunizations, lifestyle education, counseling to decrease risk of preventable diseases and screening for fall risk and other medical concerns.    This visit is provided free of charge (no copay) for all Medicare recipients. The clinical pharmacists at [facility] have begun to conduct these Wellness Visits which will also include a thorough review of all your medications.    As you primary medical provider recommend that you make an appointment for your Annual Wellness Visit if you have not done so already this year.  You may set up this appointment before you leave today or you may call back [phone] and schedule an appointment.  Please make sure when you call that you mention that you are scheduling your Annual Wellness Visit with the clinical pharmacist so that the appointment may be made for the proper length of time.     Continue current medications. Continue good therapeutic lifestyle changes which include good diet and exercise. Fall precautions discussed with patient. If an FOBT was given today- please return it to our front desk. If you are over 78 years old - you may need Prevnar 13 or the adult Pneumonia vaccine.  Flu Shots will be available at our office starting mid- September. Please call and schedule a FLU CLINIC APPOINTMENT.   Elevate feet when possible Try to reduce the sodium in your diet Continue to follow-up with Dr. Juanetta GoslingHawkins and Dr. Glynda JaegerGilstrap, pulmonologist

## 2014-03-14 LAB — BMP8+EGFR
BUN/Creatinine Ratio: 15 (ref 10–22)
BUN: 14 mg/dL (ref 8–27)
CALCIUM: 9.5 mg/dL (ref 8.6–10.2)
CHLORIDE: 106 mmol/L (ref 97–108)
CO2: 26 mmol/L (ref 18–29)
CREATININE: 0.94 mg/dL (ref 0.76–1.27)
GFR calc Af Amer: 87 mL/min/{1.73_m2} (ref >59)
GFR calc non Af Amer: 75 mL/min/{1.73_m2} (ref >59)
Glucose: 99 mg/dL (ref 65–99)
Potassium: 4.8 mmol/L (ref 3.5–5.2)
Sodium: 143 mmol/L (ref 134–144)

## 2014-03-14 LAB — HEPATIC FUNCTION PANEL
ALBUMIN: 4.4 g/dL (ref 3.5–4.7)
ALT: 15 IU/L (ref 0–44)
AST: 21 IU/L (ref 0–40)
Alkaline Phosphatase: 73 IU/L (ref 39–117)
BILIRUBIN TOTAL: 0.4 mg/dL (ref 0.0–1.2)
Bilirubin, Direct: 0.12 mg/dL (ref 0.00–0.40)
Total Protein: 6.3 g/dL (ref 6.0–8.5)

## 2014-03-14 LAB — NMR, LIPOPROFILE
Cholesterol: 137 mg/dL (ref 100–199)
HDL Cholesterol by NMR: 58 mg/dL (ref >39)
HDL PARTICLE NUMBER: 27.5 umol/L — AB (ref >=30.5)
LDL Particle Number: 618 nmol/L (ref <1000)
LDL Size: 21.1 nm (ref >20.5)
LDL-C: 63 mg/dL (ref 0–99)
LP-IR Score: <25 (ref <=45)
TRIGLYCERIDES BY NMR: 80 mg/dL (ref 0–149)

## 2014-03-14 LAB — THYROID PANEL WITH TSH
FREE THYROXINE INDEX: 1.5 (ref 1.2–4.9)
T3 Uptake Ratio: 28 % (ref 24–39)
T4, Total: 5.5 ug/dL (ref 4.5–12.0)
TSH: 2.71 u[IU]/mL (ref 0.450–4.500)

## 2014-03-14 LAB — VITAMIN D 25 HYDROXY (VIT D DEFICIENCY, FRACTURES): Vit D, 25-Hydroxy: 52.7 ng/mL (ref 30.0–100.0)

## 2014-03-20 ENCOUNTER — Other Ambulatory Visit: Payer: Medicare Other

## 2014-03-20 DIAGNOSIS — Z1212 Encounter for screening for malignant neoplasm of rectum: Secondary | ICD-10-CM

## 2014-03-20 NOTE — Progress Notes (Signed)
Lab only 

## 2014-03-22 LAB — FECAL OCCULT BLOOD, IMMUNOCHEMICAL: Fecal Occult Bld: NEGATIVE

## 2014-04-11 ENCOUNTER — Other Ambulatory Visit (INDEPENDENT_AMBULATORY_CARE_PROVIDER_SITE_OTHER): Payer: Medicare Other

## 2014-04-11 DIAGNOSIS — R799 Abnormal finding of blood chemistry, unspecified: Secondary | ICD-10-CM

## 2014-04-11 LAB — POCT CBC
GRANULOCYTE PERCENT: 68.5 % (ref 37–80)
HCT, POC: 41.1 % — AB (ref 43.5–53.7)
HEMOGLOBIN: 12.7 g/dL — AB (ref 14.1–18.1)
Lymph, poc: 1.9 (ref 0.6–3.4)
MCH: 26.6 pg — AB (ref 27–31.2)
MCHC: 30.9 g/dL — AB (ref 31.8–35.4)
MCV: 86.1 fL (ref 80–97)
MPV: 7.9 fL (ref 0–99.8)
PLATELET COUNT, POC: 224 10*3/uL (ref 142–424)
POC GRANULOCYTE: 4.7 (ref 2–6.9)
POC LYMPH PERCENT: 27.6 %L (ref 10–50)
RBC: 4.8 M/uL (ref 4.69–6.13)
RDW, POC: 14.4 %
WBC: 6.9 10*3/uL (ref 4.6–10.2)

## 2014-04-30 LAB — PULMONARY FUNCTION TEST

## 2014-06-05 ENCOUNTER — Other Ambulatory Visit (INDEPENDENT_AMBULATORY_CARE_PROVIDER_SITE_OTHER): Payer: Medicare Other

## 2014-06-05 DIAGNOSIS — R799 Abnormal finding of blood chemistry, unspecified: Secondary | ICD-10-CM | POA: Diagnosis not present

## 2014-06-05 LAB — POCT CBC
Granulocyte percent: 71.2 %G (ref 37–80)
HCT, POC: 39.2 % — AB (ref 43.5–53.7)
Hemoglobin: 12.2 g/dL — AB (ref 14.1–18.1)
LYMPH, POC: 1.8 (ref 0.6–3.4)
MCH: 26.9 pg — AB (ref 27–31.2)
MCHC: 31.2 g/dL — AB (ref 31.8–35.4)
MCV: 86 fL (ref 80–97)
MPV: 7.6 fL (ref 0–99.8)
PLATELET COUNT, POC: 272 10*3/uL (ref 142–424)
POC Granulocyte: 5.2 (ref 2–6.9)
POC LYMPH PERCENT: 25.3 %L (ref 10–50)
RBC: 4.56 M/uL — AB (ref 4.69–6.13)
RDW, POC: 15 %
WBC: 7.3 10*3/uL (ref 4.6–10.2)

## 2014-06-05 NOTE — Progress Notes (Signed)
Lab only 

## 2014-06-13 IMAGING — CR DG SHOULDER 2+V*L*
3 series · 3 of 3 positions shown · non-contrast
Comparison: None.

CLINICAL DATA: Injury, pain.

LEFT SHOULDER - 2+ VIEW

[view not recorded (1 of 3)]
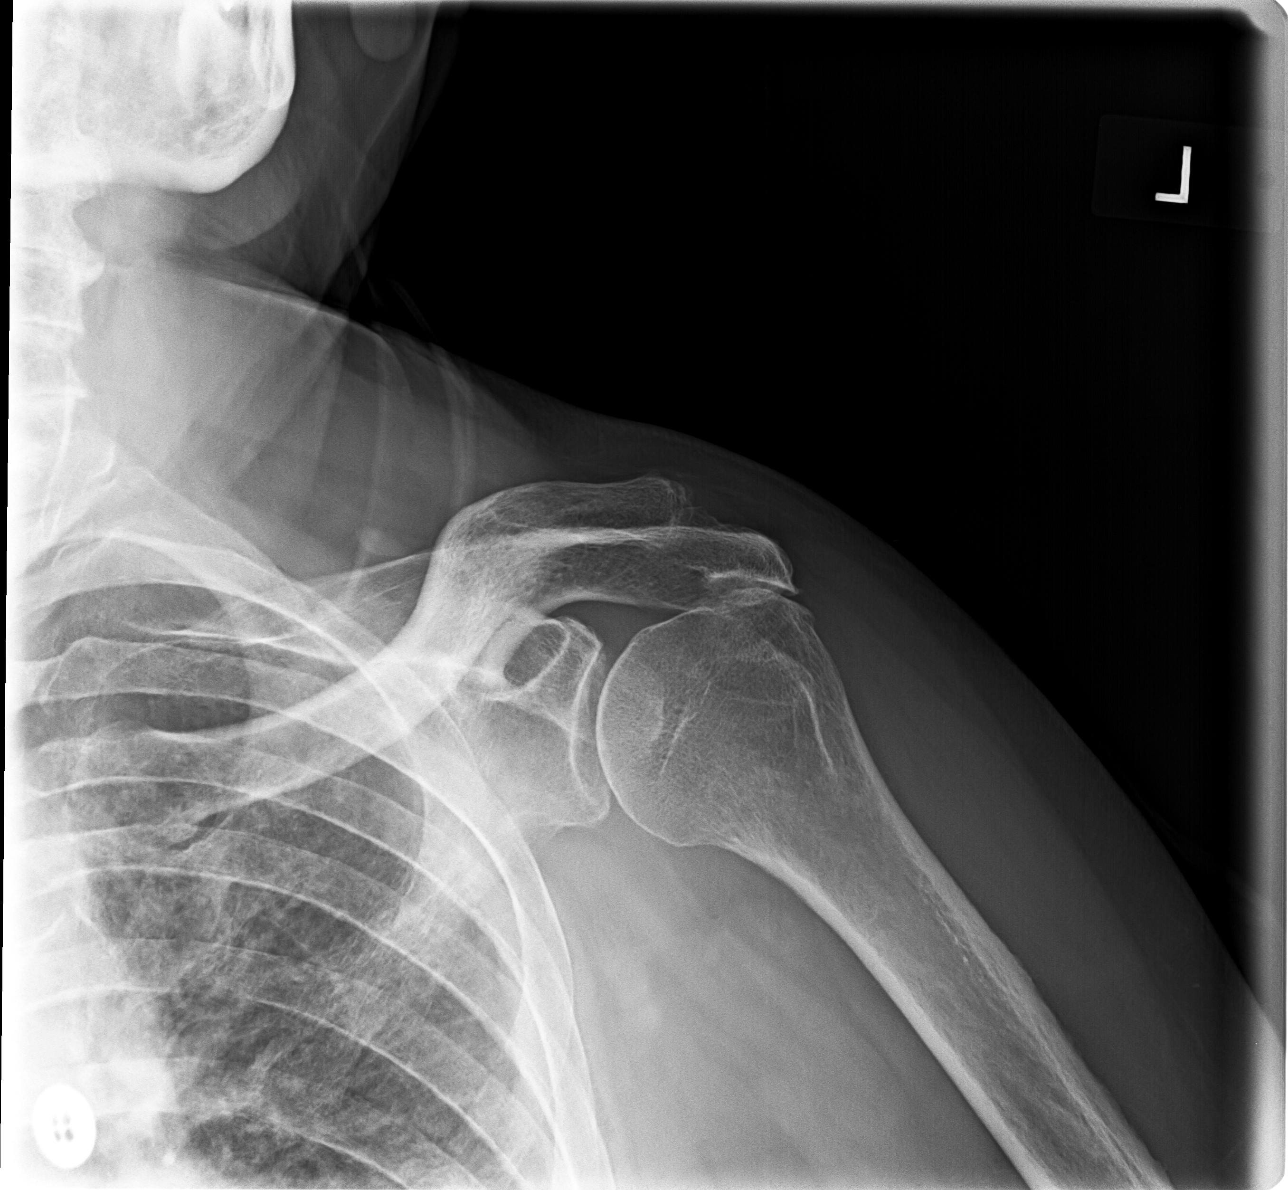

[view not recorded (2 of 3)]
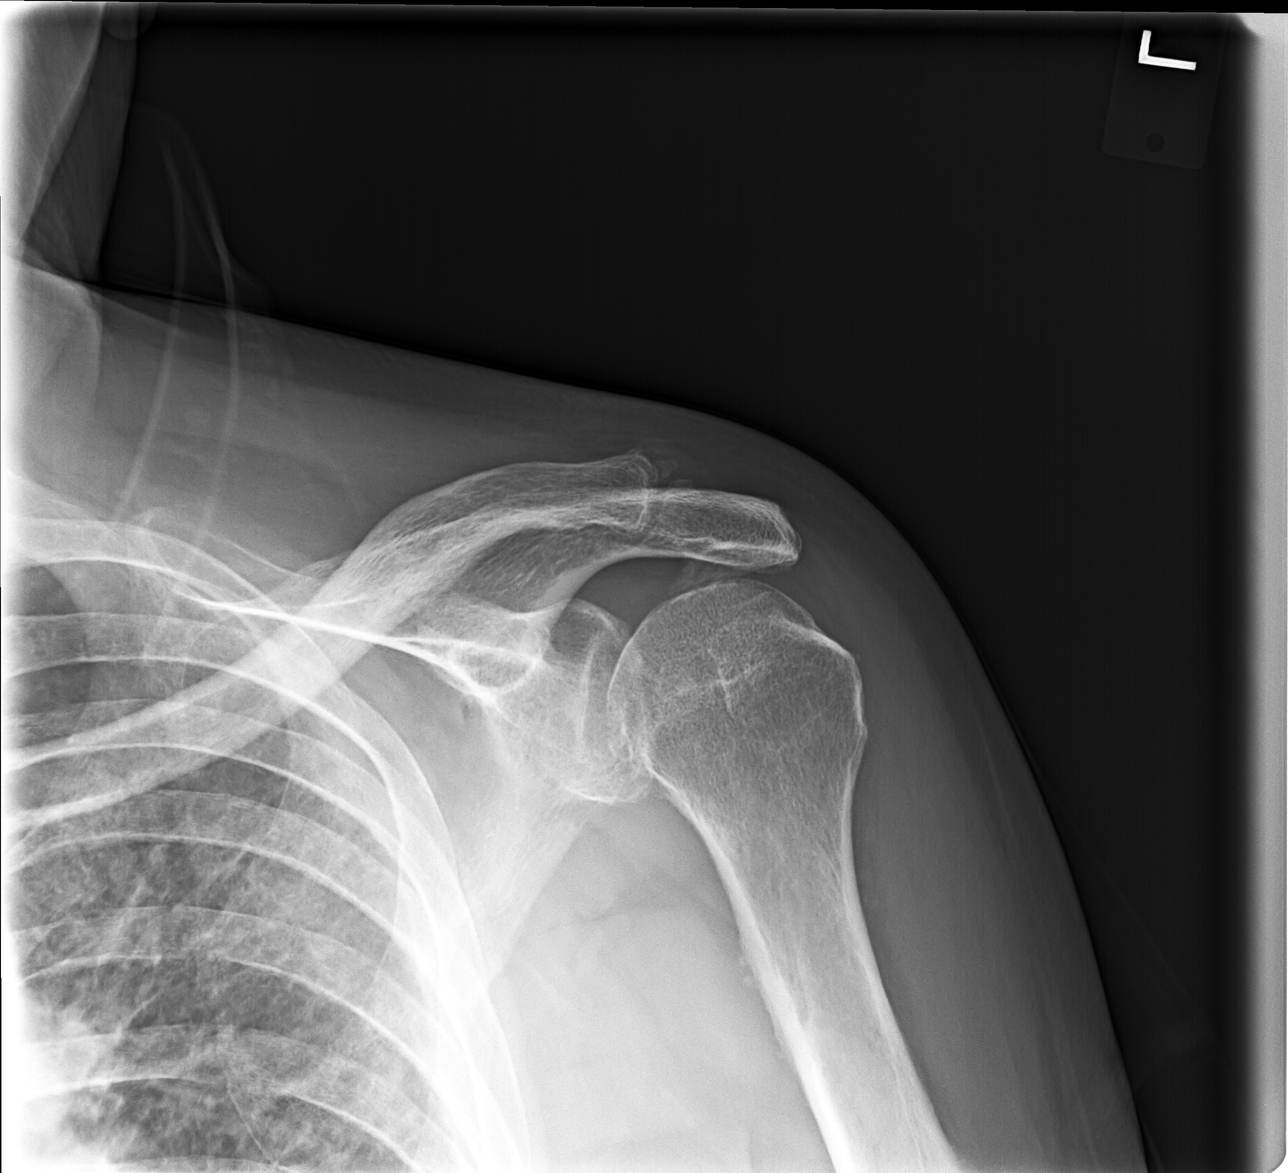

[view not recorded (3 of 3)]
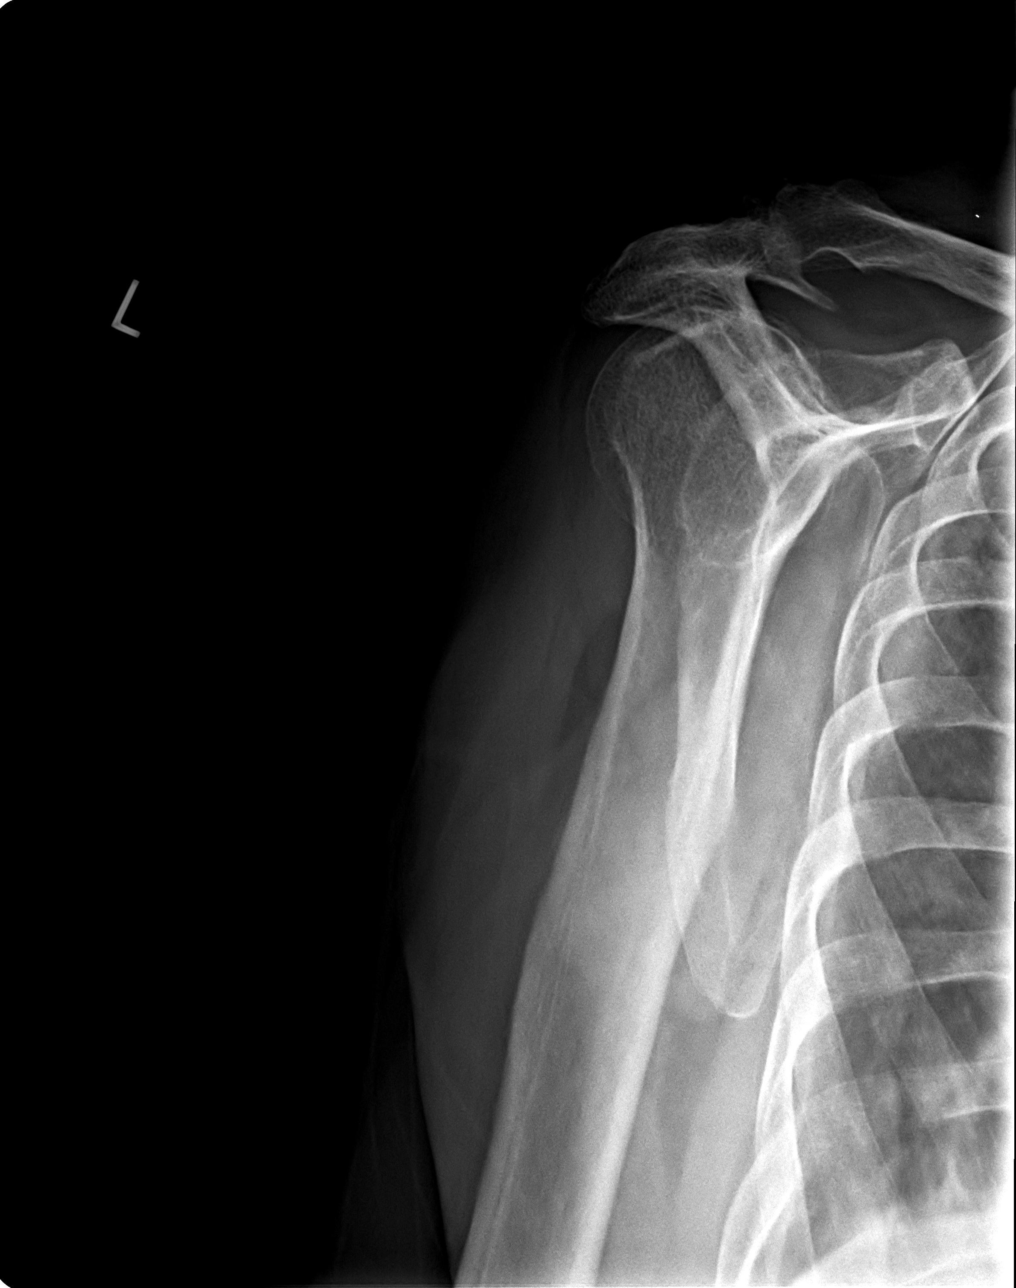

[3 of 3 positions shown; findings below may reference images not displayed]

FINDINGS: The humerus is located and the acromioclavicular joint is
intact. There is no fracture.  The patient has acromioclavicular
osteoarthritis and prominent subacromial spurring.  Imaged left
lung demonstrates peripheral fibrosis. No evidence of acute
process.
IMPRESSION: 1.  No acute finding.
2.  Acromioclavicular osteoarthritis and subacromial spurring.

## 2014-07-26 ENCOUNTER — Ambulatory Visit (INDEPENDENT_AMBULATORY_CARE_PROVIDER_SITE_OTHER): Payer: Medicare Other | Admitting: Family Medicine

## 2014-07-26 ENCOUNTER — Encounter: Payer: Self-pay | Admitting: Family Medicine

## 2014-07-26 VITALS — BP 104/65 | HR 70 | Temp 97.6°F | Ht 65.0 in | Wt 175.0 lb

## 2014-07-26 DIAGNOSIS — Z79899 Other long term (current) drug therapy: Secondary | ICD-10-CM

## 2014-07-26 DIAGNOSIS — E039 Hypothyroidism, unspecified: Secondary | ICD-10-CM | POA: Diagnosis not present

## 2014-07-26 DIAGNOSIS — R0602 Shortness of breath: Secondary | ICD-10-CM | POA: Diagnosis not present

## 2014-07-26 DIAGNOSIS — E785 Hyperlipidemia, unspecified: Secondary | ICD-10-CM

## 2014-07-26 DIAGNOSIS — N400 Enlarged prostate without lower urinary tract symptoms: Secondary | ICD-10-CM | POA: Diagnosis not present

## 2014-07-26 DIAGNOSIS — J8410 Pulmonary fibrosis, unspecified: Secondary | ICD-10-CM

## 2014-07-26 DIAGNOSIS — E559 Vitamin D deficiency, unspecified: Secondary | ICD-10-CM | POA: Diagnosis not present

## 2014-07-26 LAB — POCT CBC
Granulocyte percent: 68.4 %G (ref 37–80)
HCT, POC: 41 % — AB (ref 43.5–53.7)
Hemoglobin: 12.6 g/dL — AB (ref 14.1–18.1)
LYMPH, POC: 2 (ref 0.6–3.4)
MCH: 27.1 pg (ref 27–31.2)
MCHC: 30.6 g/dL — AB (ref 31.8–35.4)
MCV: 88.6 fL (ref 80–97)
MPV: 7.3 fL (ref 0–99.8)
POC Granulocyte: 5.5 (ref 2–6.9)
POC LYMPH %: 25.6 % (ref 10–50)
Platelet Count, POC: 242 10*3/uL (ref 142–424)
RBC: 4.63 M/uL — AB (ref 4.69–6.13)
RDW, POC: 16.1 %
WBC: 8 10*3/uL (ref 4.6–10.2)

## 2014-07-26 MED ORDER — LEVOTHYROXINE SODIUM 125 MCG PO TABS: ORAL_TABLET | ORAL | Status: DC

## 2014-07-26 MED ORDER — LEVOTHYROXINE SODIUM 25 MCG PO TABS: 25.0000 ug | ORAL_TABLET | Freq: Every day | ORAL | Status: DC

## 2014-07-26 MED ORDER — SIMVASTATIN 80 MG PO TABS: 80.0000 mg | ORAL_TABLET | Freq: Every evening | ORAL | Status: DC

## 2014-07-26 MED ORDER — TERAZOSIN HCL 10 MG PO CAPS: 10.0000 mg | ORAL_CAPSULE | Freq: Every day | ORAL | Status: DC

## 2014-07-26 MED ORDER — OMEPRAZOLE 20 MG PO CPDR: 20.0000 mg | DELAYED_RELEASE_CAPSULE | Freq: Every evening | ORAL | Status: DC

## 2014-07-26 NOTE — Progress Notes (Signed)
Subjective:    Patient ID: Raymond [name], male    DOB: 11-Jun-1929, 81 y.o.   MRN: [id number]  HPI Pt here for follow up and management of chronic medical problems which includes hyperlipidemia and hypothyroid. He is taking medications regularly. The patient has had a recent intestinal blood and this was last week with loose bowel movements and he is doing better from this. He sees the pulmonologist regularly because of his pulmonary interstitial fibrosis. The patient sees Dr. Glynda Jaeger at [facility] every 6 months and last saw him 3 months ago and sees Dr. Juanetta Gosling every 6-9 months. He has a request for certain lab work that he will sun today that will be sent to Dr. Glynda Jaeger. He is on portable oxygen 24 hours a day at 3 L/m.       Patient Active Problem List   Diagnosis Date Noted  . Vitamin D deficiency 02/19/2013  . Interstitial lung disease 02/19/2013  . BPH (benign prostatic hyperplasia) 09/04/2012  . Allergic rhinitis 09/24/2010  .  Cough, chronic 06/26/2010  . PULMONARY FIBROSIS ILD POST INFLAMMATORY CHRONIC 12/30/2009  . HYPOTHYROIDISM 12/29/2009  . HYPERLIPIDEMIA 12/29/2009  . CORONARY HEART DISEASE 12/29/2009   Outpatient Encounter Prescriptions as of 07/26/2014  Medication Sig  . Cholecalciferol (VITAMIN D) 2000 UNITS CAPS Take 2,000-4,000 Units by mouth daily. Takes 2000 units everyday except for Fri, Sat, and Sun patient takes 4000 units.  . citalopram (CELEXA) 20 MG tablet Take 1 tablet by mouth daily.  Marland Kitchen levothyroxine (SYNTHROID, LEVOTHROID) 125 MCG tablet TAKE ONE TABLET BY MOUTH EVERY DAY as directed  . levothyroxine (SYNTHROID, LEVOTHROID) 25 MCG tablet Take 1 tablet (25 mcg total) by mouth daily before breakfast.  . mycophenolate (CELLCEPT) 500 MG tablet Take 2 tablets by mouth 2 (two) times daily.  Marland Kitchen omeprazole (PRILOSEC) 20 MG capsule Take 1 capsule (20 mg total) by mouth every evening.  . predniSONE (DELTASONE) 10 MG tablet Take 5 mg by mouth daily.   .  simvastatin (ZOCOR) 80 MG tablet Take 1 tablet (80 mg total) by mouth every evening.  . sulfamethoxazole-trimethoprim (BACTRIM DS) 800-160 MG per tablet Mon, wed, fri- ONLY  . terazosin (HYTRIN) 10 MG capsule Take 1 capsule (10 mg total) by mouth at bedtime.     Review of Systems  Constitutional: Negative.   HENT: Negative.   Eyes: Negative.   Respiratory: Negative.   Cardiovascular: Negative.   Gastrointestinal: Positive for diarrhea (recent few days of diarrhea.).  Endocrine: Negative.   Genitourinary: Negative.   Musculoskeletal: Negative.   Skin: Negative.   Allergic/Immunologic: Negative.   Neurological: Negative.   Hematological: Negative.   Psychiatric/Behavioral: Negative.        Objective:   Physical Exam  Constitutional: He is oriented to person, place, and time. He appears well-developed and well-nourished. No distress.  The patient is pleasant and alert with his nasal oxygen and in no acute distress  HENT:  Head: Normocephalic and atraumatic.  Right Ear: External ear normal.  Left Ear: External ear normal.  Nose: Nose normal.  Mouth/Throat: Oropharynx is clear and moist. No oropharyngeal exudate.  Eyes: Conjunctivae and EOM are normal. Pupils are equal, round, and reactive to light. Right eye exhibits no discharge. Left eye exhibits no discharge. No scleral icterus.  Neck: Normal range of motion. Neck supple. No tracheal deviation present. No thyromegaly present.  Cardiovascular: Normal rate, regular rhythm, normal heart sounds and intact distal pulses.   No murmur heard. At 72/m  Pulmonary/Chest: Effort normal.  No respiratory distress. He has no wheezes. He has rales. He exhibits no tenderness.  The patient has basilar crackles bilaterally and posteriorly. No worse than in the past. There is good air movement. His pulse ox is 97% on 3 L of O2.  Abdominal: Soft. Bowel sounds are normal. He exhibits distension. He exhibits no mass. There is no tenderness. There is  no rebound and no guarding.  Increased gas and tympany  Musculoskeletal: Normal range of motion. He exhibits no edema or tenderness.  Lymphadenopathy:    He has no cervical adenopathy.  Neurological: He is alert and oriented to person, place, and time. He has normal reflexes. No cranial nerve deficit.  Skin: Skin is warm and dry. No rash noted. No erythema. No pallor.  Psychiatric: He has a normal mood and affect. His behavior is normal. Judgment and thought content normal.  Nursing note and vitals reviewed.  BP 104/65 mmHg  Pulse 70  Temp(Src) 97.6 F (36.4 C) (Oral)  Ht $R'5\' 5"'zd$  (1.651 m)  Wt 175 lb (79.379 kg)  BMI 29.12 kg/m2  SpO2 97%        Assessment & Plan:  1. BPH (benign prostatic hyperplasia) -The patient is not having any complaints with affording today and is not due for his rectal exam until the next visit. - POCT CBC  2. Vitamin D deficiency -Continue current vitamin D replacement pending results of lab work - POCT CBC - Vit D  25 hydroxy (rtn osteoporosis monitoring)  3. High risk medication use - POCT CBC - BMP8+EGFR - Hepatic function panel  4. Hyperlipidemia -Continue current treatment pending results of lab work - POCT CBC - NMR, lipoprofile  5. Hypothyroidism, unspecified hypothyroidism type -Continue current treatment pending results of lab work - POCT CBC - Thyroid Panel With TSH  6. Pulmonary fibrosis -Follow-up with pulmonologist as planned - POCT CBC  7. SOB (shortness of breath) -The patient is stable at the present time on 3 L of O2 24 hours daily - POCT CBC - BMP8+EGFR - Hepatic function panel  8. PULMONARY FIBROSIS ILD POST INFLAMMATORY CHRONIC -Follow-up with pulmonology  Meds ordered this encounter  Medications  . DISCONTD: mycophenolate (CELLCEPT) 500 MG tablet    Sig: Take by mouth.  . levothyroxine (SYNTHROID, LEVOTHROID) 25 MCG tablet    Sig: Take 1 tablet (25 mcg total) by mouth daily before breakfast.    Dispense:   90 tablet    Refill:  3  . levothyroxine (SYNTHROID, LEVOTHROID) 125 MCG tablet    Sig: TAKE ONE TABLET BY MOUTH EVERY DAY as directed    Dispense:  90 tablet    Refill:  3  . omeprazole (PRILOSEC) 20 MG capsule    Sig: Take 1 capsule (20 mg total) by mouth every evening.    Dispense:  90 capsule    Refill:  3  . simvastatin (ZOCOR) 80 MG tablet    Sig: Take 1 tablet (80 mg total) by mouth every evening.    Dispense:  90 tablet    Refill:  3  . terazosin (HYTRIN) 10 MG capsule    Sig: Take 1 capsule (10 mg total) by mouth at bedtime.    Dispense:  90 capsule    Refill:  3   Patient Instructions                       Medicare Annual Wellness Visit  [place] and the medical providers at [facility] strive to bring you the best medical care.  In doing so we not only want to address your current medical conditions and concerns but also to detect new conditions early and prevent illness, disease and health-related problems.    Medicare offers a yearly Wellness Visit which allows our clinical staff to assess your need for preventative services including immunizations, lifestyle education, counseling to decrease risk of preventable diseases and screening for fall risk and other medical concerns.    This visit is provided free of charge (no copay) for all Medicare recipients. The clinical pharmacists at [facility] have begun to conduct these Wellness Visits which will also include a thorough review of all your medications.    As you primary medical provider recommend that you make an appointment for your Annual Wellness Visit if you have not done so already this year.  You may set up this appointment before you leave today or you may call back [phone]) and schedule an appointment.  Please make sure when you call that you mention that you are scheduling your Annual Wellness Visit with the clinical pharmacist so that the appointment may be made  for the proper length of time.     Continue current medications. Continue good therapeutic lifestyle changes which include good diet and exercise. Fall precautions discussed with patient. If an FOBT was given today- please return it to our front desk. If you are over 73 years old - you may need Prevnar 58 or the adult Pneumonia vaccine.  Flu Shots are still available at our office. If you still haven't had one please call to set up a nurse visit to get one.   After your visit with [place] today you will receive a survey in the mail or online from [employer] regarding your care with [place]. Please take a moment to fill this out. Your feedback is very important to [place] as you can help [place] better understand your patient needs as well as improve your experience and satisfaction. WE CARE ABOUT YOU!!!   Continue portable oxygen Continue follow-up with pulmonologists in [place] and [place] Continue to practice good pulmonary hygiene with protective mask and deep breathing exercises Continue to work with your diet over the next few days and drink plenty of fluids, avoiding caffeine and milk cheese ice cream and dairy products You can purchase saline nasal spray over-the-counter and this may be good for you to use during the daytime   Arrie Senate MD

## 2014-07-26 NOTE — Patient Instructions (Addendum)
Medicare Annual Wellness Visit  [place] and the medical providers at [facility] strive to bring you the best medical care.  In doing so we not only want to address your current medical conditions and concerns but also to detect new conditions early and prevent illness, disease and health-related problems.    Medicare offers a yearly Wellness Visit which allows our clinical staff to assess your need for preventative services including immunizations, lifestyle education, counseling to decrease risk of preventable diseases and screening for fall risk and other medical concerns.    This visit is provided free of charge (no copay) for all Medicare recipients. The clinical pharmacists at [facility] have begun to conduct these Wellness Visits which will also include a thorough review of all your medications.    As you primary medical provider recommend that you make an appointment for your Annual Wellness Visit if you have not done so already this year.  You may set up this appointment before you leave today or you may call back [phone] and schedule an appointment.  Please make sure when you call that you mention that you are scheduling your Annual Wellness Visit with the clinical pharmacist so that the appointment may be made for the proper length of time.     Continue current medications. Continue good therapeutic lifestyle changes which include good diet and exercise. Fall precautions discussed with patient. If an FOBT was given today- please return it to our front desk. If you are over 79 years old - you may need Prevnar 13 or the adult Pneumonia vaccine.  Flu Shots are still available at our office. If you still haven't had one please call to set up a nurse visit to get one.   After your visit with us today you will receive a survey in the mail or online from [employer] regarding your care with us. Please take a moment to  fill this out. Your feedback is very important to us as you can help us better understand your patient needs as well as improve your experience and satisfaction. WE CARE ABOUT YOU!!!   Continue portable oxygen Continue follow-up with pulmonologists in [place] and [place] Continue to practice good pulmonary hygiene with protective mask and deep breathing exercises Continue to work with your diet over the next few days and drink plenty of fluids, avoiding caffeine and milk cheese ice cream and dairy products You can purchase saline nasal spray over-the-counter and this may be good for you to use during the daytime

## 2014-07-27 LAB — THYROID PANEL WITH TSH
Free Thyroxine Index: 2.9 (ref 1.2–4.9)
T3 Uptake Ratio: 30 % (ref 24–39)
T4 TOTAL: 9.5 ug/dL (ref 4.5–12.0)
TSH: 3.28 u[IU]/mL (ref 0.450–4.500)

## 2014-07-27 LAB — HEPATIC FUNCTION PANEL
ALBUMIN: 3.9 g/dL (ref 3.5–4.7)
ALK PHOS: 59 IU/L (ref 39–117)
ALT: 15 IU/L (ref 0–44)
AST: 21 IU/L (ref 0–40)
BILIRUBIN TOTAL: 0.7 mg/dL (ref 0.0–1.2)
Bilirubin, Direct: 0.23 mg/dL (ref 0.00–0.40)
Total Protein: 6.5 g/dL (ref 6.0–8.5)

## 2014-07-27 LAB — NMR, LIPOPROFILE
Cholesterol: 150 mg/dL (ref 100–199)
HDL Cholesterol by NMR: 73 mg/dL (ref >39)
HDL Particle Number: 31.9 umol/L (ref >=30.5)
LDL Particle Number: 690 nmol/L (ref <1000)
LDL Size: 21 nm (ref >20.5)
LDL-C: 59 mg/dL (ref 0–99)
Triglycerides by NMR: 90 mg/dL (ref 0–149)

## 2014-07-27 LAB — BMP8+EGFR
BUN/Creatinine Ratio: 19 (ref 10–22)
BUN: 16 mg/dL (ref 8–27)
CHLORIDE: 98 mmol/L (ref 97–108)
CO2: 27 mmol/L (ref 18–29)
Calcium: 9.1 mg/dL (ref 8.6–10.2)
Creatinine, Ser: 0.83 mg/dL (ref 0.76–1.27)
GFR calc Af Amer: 94 mL/min/{1.73_m2} (ref >59)
GFR calc non Af Amer: 81 mL/min/{1.73_m2} (ref >59)
Glucose: 86 mg/dL (ref 65–99)
Potassium: 4 mmol/L (ref 3.5–5.2)
SODIUM: 139 mmol/L (ref 134–144)

## 2014-07-27 LAB — VITAMIN D 25 HYDROXY (VIT D DEFICIENCY, FRACTURES): VIT D 25 HYDROXY: 50.3 ng/mL (ref 30.0–100.0)

## 2014-08-07 ENCOUNTER — Encounter: Payer: Self-pay | Admitting: Gastroenterology

## 2014-08-13 ENCOUNTER — Ambulatory Visit (INDEPENDENT_AMBULATORY_CARE_PROVIDER_SITE_OTHER): Payer: Medicare Other | Admitting: Family Medicine

## 2014-08-13 ENCOUNTER — Encounter: Payer: Self-pay | Admitting: Family Medicine

## 2014-08-13 VITALS — BP 115/66 | HR 83 | Temp 97.2°F | Ht 65.0 in | Wt 175.0 lb

## 2014-08-13 DIAGNOSIS — J8410 Pulmonary fibrosis, unspecified: Secondary | ICD-10-CM

## 2014-08-13 DIAGNOSIS — J019 Acute sinusitis, unspecified: Secondary | ICD-10-CM

## 2014-08-13 MED ORDER — METHYLPREDNISOLONE ACETATE 80 MG/ML IJ SUSP
60.0000 mg | Freq: Once | INTRAMUSCULAR | Status: AC
  Administered 2014-08-13: 60 mg via INTRAMUSCULAR

## 2014-08-13 MED ORDER — AMOXICILLIN-POT CLAVULANATE 875-125 MG PO TABS: 1.0000 | ORAL_TABLET | Freq: Two times a day (BID) | ORAL | Status: DC

## 2014-08-13 NOTE — Progress Notes (Signed)
Subjective:    Patient ID: Raymond SkainsWilliam H [name], male    DOB: 1929-07-21, 81 y.o.   MRN: [id number]  HPI Patient here today for cough, head congestion and ear pressure that started 3-4 days ago. The patient is still taking prednisone 5 mg daily for his lungs. He says he's had no fever and the congestion has a neutral color and is not yellow or green. He continues to use O2 portable.     Patient Active Problem List   Diagnosis Date Noted  . Vitamin D deficiency 02/19/2013  . Interstitial lung disease 02/19/2013  . BPH (benign prostatic hyperplasia) 09/04/2012  . Allergic rhinitis 09/24/2010  .  Cough, chronic 06/26/2010  . PULMONARY FIBROSIS ILD POST INFLAMMATORY CHRONIC 12/30/2009  . HYPOTHYROIDISM 12/29/2009  . HYPERLIPIDEMIA 12/29/2009  . CORONARY HEART DISEASE 12/29/2009   Outpatient Encounter Prescriptions as of 08/13/2014  Medication Sig  . Cholecalciferol (VITAMIN D) 2000 UNITS CAPS Take 2,000-4,000 Units by mouth daily. Takes 2000 units everyday except for Fri, Sat, and Sun patient takes 4000 units.  . citalopram (CELEXA) 20 MG tablet Take 1 tablet by mouth daily.  Marland Kitchen. levothyroxine (SYNTHROID, LEVOTHROID) 125 MCG tablet TAKE ONE TABLET BY MOUTH EVERY DAY as directed  . levothyroxine (SYNTHROID, LEVOTHROID) 25 MCG tablet Take 1 tablet (25 mcg total) by mouth daily before breakfast.  . mycophenolate (CELLCEPT) 500 MG tablet Take 2 tablets by mouth 2 (two) times daily.  Marland Kitchen. omeprazole (PRILOSEC) 20 MG capsule Take 1 capsule (20 mg total) by mouth every evening.  . predniSONE (DELTASONE) 10 MG tablet Take 5 mg by mouth daily.   . simvastatin (ZOCOR) 80 MG tablet Take 1 tablet (80 mg total) by mouth every evening.  . sulfamethoxazole-trimethoprim (BACTRIM DS) 800-160 MG per tablet Mon, wed, fri- ONLY  . terazosin (HYTRIN) 10 MG capsule Take 1 capsule (10 mg total) by mouth at bedtime.   No facility-administered encounter medications on file as of 08/13/2014.      Review of  Systems  Constitutional: Negative.   HENT: Positive for sinus pressure. Congestion: head.        Ears popping  Eyes: Negative.   Respiratory: Positive for cough.   Cardiovascular: Negative.   Gastrointestinal: Negative.   Endocrine: Negative.   Genitourinary: Negative.   Musculoskeletal: Negative.   Skin: Negative.   Allergic/Immunologic: Negative.   Neurological: Negative.   Hematological: Negative.   Psychiatric/Behavioral: Negative.        Objective:   Physical Exam  Constitutional: He is oriented to person, place, and time. He appears well-developed and well-nourished. No distress.  Patient is pleasant and alert and is wearing his nasal oxygen cannula.  HENT:  Head: Normocephalic and atraumatic.  Mouth/Throat: No oropharyngeal exudate.  The TMs are full bilaterally and there is nasal congestion bilaterally. The throat is clear.  Eyes: Conjunctivae and EOM are normal. Pupils are equal, round, and reactive to light. Right eye exhibits no discharge. Left eye exhibits no discharge. No scleral icterus.  Neck: Normal range of motion. Neck supple. No thyromegaly present.  Cardiovascular: Normal rate, regular rhythm and normal heart sounds.   Pulmonary/Chest: Effort normal. No respiratory distress. He has wheezes. He has no rales.  Breath sounds are congested bilaterally with some wheezes and crackles as usual  Musculoskeletal: Normal range of motion.  Lymphadenopathy:    He has no cervical adenopathy.  Neurological: He is alert and oriented to person, place, and time.  Skin: Skin is warm and dry. No rash noted.  Psychiatric: He has a normal mood and affect. His behavior is normal. Judgment and thought content normal.  Nursing note and vitals reviewed.  BP 115/66 mmHg  Pulse 83  Temp(Src) 97.2 F (36.2 C) (Oral)  Ht 5\' 5"  (1.651 m)  Wt 175 lb (79.379 kg)  BMI 29.12 kg/m2        Assessment & Plan:  1. Acute rhinosinusitis -The patient should start using Flonase 1-2  sprays each nostril at bedtime -He should use nasal saline during the day -He should continue to drink plenty of fluids and take Tylenol for aches pains and fever - amoxicillin-clavulanate (AUGMENTIN) 875-125 MG per tablet; Take 1 tablet by mouth 2 (two) times daily.  Dispense: 20 tablet; Refill: 0 - methylPREDNISolone acetate (DEPO-MEDROL) injection 60 mg; Inject 0.75 mLs (60 mg total) into the muscle once.  2. Pulmonary fibrosis -Take Mucinex as directed -Use nebulizer treatment -Increase prednisone to 10 mg daily for 5 days  Patient Instructions  Continue to drink plenty of fluids Use Flonase nasal spray over-the-counter 1-2 sprays each nostril at bedtime Use nasal saline frequently in each nostril during the day Take Mucinex, maximum strength, blue and white in color, 1 twice daily with a large glass of water for cough and congestion Take 10 mg of prednisone daily for 5 days Continue nebulizer treatments Take antibiotic as directed    Nyra Capeson W. Zed Wanninger MD

## 2014-08-13 NOTE — Patient Instructions (Addendum)
Continue to drink plenty of fluids Use Flonase nasal spray over-the-counter 1-2 sprays each nostril at bedtime Use nasal saline frequently in each nostril during the day Take Mucinex, maximum strength, blue and white in color, 1 twice daily with a large glass of water for cough and congestion Take 10 mg of prednisone daily for 5 days Continue nebulizer treatments Take antibiotic as directed

## 2014-08-22 ENCOUNTER — Telehealth: Payer: Self-pay | Admitting: Family Medicine

## 2014-08-22 NOTE — Telephone Encounter (Signed)
Pt called and copy of DMV given

## 2014-08-27 ENCOUNTER — Encounter: Payer: Self-pay | Admitting: *Deleted

## 2014-10-08 ENCOUNTER — Telehealth: Payer: Self-pay | Admitting: Family Medicine

## 2014-10-08 NOTE — Telephone Encounter (Signed)
Copy of labs to front Pt notified

## 2014-10-29 LAB — PULMONARY FUNCTION TEST

## 2014-12-11 ENCOUNTER — Ambulatory Visit: Payer: Medicare Other | Admitting: Family Medicine

## 2014-12-12 ENCOUNTER — Ambulatory Visit: Payer: Medicare Other | Admitting: Family Medicine

## 2014-12-16 ENCOUNTER — Telehealth: Payer: Self-pay | Admitting: Family Medicine

## 2014-12-16 NOTE — Telephone Encounter (Signed)
Appointment given for Thursday  and patient asked to arrive at 7:45.

## 2014-12-19 ENCOUNTER — Encounter: Payer: Self-pay | Admitting: Family Medicine

## 2014-12-19 ENCOUNTER — Ambulatory Visit (INDEPENDENT_AMBULATORY_CARE_PROVIDER_SITE_OTHER): Payer: Medicare Other | Admitting: Family Medicine

## 2014-12-19 VITALS — BP 107/73 | HR 67 | Temp 97.1°F | Ht 65.0 in | Wt 169.0 lb

## 2014-12-19 DIAGNOSIS — N400 Enlarged prostate without lower urinary tract symptoms: Secondary | ICD-10-CM

## 2014-12-19 DIAGNOSIS — E559 Vitamin D deficiency, unspecified: Secondary | ICD-10-CM | POA: Diagnosis not present

## 2014-12-19 DIAGNOSIS — J8410 Pulmonary fibrosis, unspecified: Secondary | ICD-10-CM | POA: Diagnosis not present

## 2014-12-19 DIAGNOSIS — E039 Hypothyroidism, unspecified: Secondary | ICD-10-CM | POA: Diagnosis not present

## 2014-12-19 DIAGNOSIS — E785 Hyperlipidemia, unspecified: Secondary | ICD-10-CM | POA: Diagnosis not present

## 2014-12-19 LAB — POCT URINALYSIS DIPSTICK
Bilirubin, UA: NEGATIVE
GLUCOSE UA: NEGATIVE
Ketones, UA: NEGATIVE
NITRITE UA: NEGATIVE
PROTEIN UA: NEGATIVE
SPEC GRAV UA: 1.025
UROBILINOGEN UA: NEGATIVE
pH, UA: 6

## 2014-12-19 LAB — POCT UA - MICROSCOPIC ONLY
BACTERIA, U MICROSCOPIC: NEGATIVE
CRYSTALS, UR, HPF, POC: NEGATIVE
Casts, Ur, LPF, POC: NEGATIVE
Mucus, UA: NEGATIVE

## 2014-12-19 NOTE — Progress Notes (Signed)
Subjective:    Patient ID: Raymond [name], male    DOB: October 13, 1929, 81 y.o.   MRN: [id number]  HPI Pt here for follow up and management of chronic medical problems which includes hypothyroid, BPH, and hyperlipidemia. He is taking medications regularly. The patient's biggest issue today is his continued right hip pain which we think and the orthopedist thinks is coming from his back. He does have an appointment scheduled in October for an injection in his back that he is in quite a bit of pain today and even his wife is very concerned about this. The orthopedist happens to be in the office today and we will have him see him to see if there's something he can do short term until the injection appointment comes up to help this man's pain. He also complains of some bruising in his left arm which is secondary to a fall. He will get lab work today have a rectal exam today and get a urinalysis today. For his interstitial lung disease he is followed regularly by the pulmonologist. He also sees a pulmonologist at [facility]. He brings in pulmonary function tests from that evaluation and these will be scanned into the record and the originals will be given back to the patient to take to his local pulmonologist. His FVC and FEV1 were both low on this report. It is also important to note that the patient is still taking prednisone.      Patient Active Problem List   Diagnosis Date Noted  . Vitamin D deficiency 02/19/2013  . Interstitial lung disease 02/19/2013  . BPH (benign prostatic hyperplasia) 09/04/2012  . Allergic rhinitis 09/24/2010  .  Cough, chronic 06/26/2010  . PULMONARY FIBROSIS ILD POST INFLAMMATORY CHRONIC 12/30/2009  . HYPOTHYROIDISM 12/29/2009  . HYPERLIPIDEMIA 12/29/2009  . CORONARY HEART DISEASE 12/29/2009   Outpatient Encounter Prescriptions as of 12/19/2014  Medication Sig  . Cholecalciferol (VITAMIN D) 2000 UNITS CAPS Take 2,000-4,000 Units by mouth daily. Takes  2000 units everyday except for Fri, Sat, and Sun patient takes 4000 units.  Marland Kitchen levothyroxine (SYNTHROID, LEVOTHROID) 125 MCG tablet TAKE ONE TABLET BY MOUTH EVERY DAY as directed  . levothyroxine (SYNTHROID, LEVOTHROID) 25 MCG tablet Take 1 tablet (25 mcg total) by mouth daily before breakfast.  . mycophenolate (CELLCEPT) 500 MG tablet Take 2 tablets by mouth 2 (two) times daily.  Marland Kitchen omeprazole (PRILOSEC) 20 MG capsule Take 1 capsule (20 mg total) by mouth every evening.  . predniSONE (DELTASONE) 10 MG tablet Take 5 mg by mouth daily.   . simvastatin (ZOCOR) 80 MG tablet Take 1 tablet (80 mg total) by mouth every evening.  . terazosin (HYTRIN) 10 MG capsule Take 1 capsule (10 mg total) by mouth at bedtime.  . citalopram (CELEXA) 20 MG tablet Take 1 tablet by mouth daily.  . [DISCONTINUED] amoxicillin-clavulanate (AUGMENTIN) 875-125 MG per tablet Take 1 tablet by mouth 2 (two) times daily.  . [DISCONTINUED] sulfamethoxazole-trimethoprim (BACTRIM DS) 800-160 MG per tablet Mon, wed, fri- ONLY   No facility-administered encounter medications on file as of 12/19/2014.      Review of Systems  Constitutional: Negative.   HENT: Negative.   Eyes: Negative.   Respiratory: Negative.   Cardiovascular: Negative.   Gastrointestinal: Negative.   Endocrine: Negative.   Genitourinary: Negative.   Musculoskeletal: Positive for back pain and arthralgias (right hip pain).  Skin: Positive for color change (bruising - left arm).  Allergic/Immunologic: Negative.   Neurological: Negative.   Hematological: Negative.  Psychiatric/Behavioral: Negative.        Objective:   Physical Exam  Constitutional: He is oriented to person, place, and time. He appears well-developed and well-nourished. No distress.  The patient is pleasant and alert and wearing his O2 because of his interstitial lung disease.  HENT:  Head: Normocephalic and atraumatic.  Right Ear: External ear normal.  Left Ear: External ear normal.    Mouth/Throat: Oropharynx is clear and moist. No oropharyngeal exudate.  Nasal congestion  Eyes: Conjunctivae and EOM are normal. Pupils are equal, round, and reactive to light. Right eye exhibits no discharge. Left eye exhibits no discharge. No scleral icterus.  Neck: Normal range of motion. Neck supple. No thyromegaly present.  No thyroid enlargement or anterior cervical adenopathy  Cardiovascular: Normal rate, regular rhythm, normal heart sounds and intact distal pulses.   No murmur heard. The heart has a regular rate and rhythm at 72/m and he has good pedal pulses bilaterally.  Pulmonary/Chest: Effort normal. No respiratory distress. He has no wheezes. He has no rales. He exhibits no tenderness.  Diminished breath sounds in the right base posteriorly and the patient says this is been this way in the past. Otherwise fairly good breath sounds with minimal congestion.  Abdominal: Soft. Bowel sounds are normal. He exhibits no mass. There is no tenderness. There is no rebound and no guarding.  Genitourinary: Rectum normal and penis normal.  The prostate was enlarged but soft and smooth without lumps or masses.  Musculoskeletal: Normal range of motion. He exhibits no edema or tenderness.  Lymphadenopathy:    He has no cervical adenopathy.  Neurological: He is alert and oriented to person, place, and time. He has normal reflexes. No cranial nerve deficit.  Skin: Skin is warm and dry. No rash noted.  Psychiatric: He has a normal mood and affect. His behavior is normal. Judgment and thought content normal.  Nursing note and vitals reviewed.  BP 107/73 mmHg  Pulse 67  Temp(Src) 97.1 F (36.2 C) (Oral)  Ht _0  (1.651 m)  Wt 169 lb (76.658 kg)  BMI 28.12 kg/m2        Assessment & Plan:  1. BPH (benign prostatic hyperplasia) -The patient is having no specific symptoms with this and we'll continue to drink plenty of fluids and keep himself well hydrated. - CBC with  Differential/Platelet - POCT UA - Microscopic Only - POCT urinalysis dipstick  2. Vitamin D deficiency -He will continue with vitamin D replacement pending results of lab work - CBC with Differential/Platelet - Vit D  25 hydroxy (rtn osteoporosis monitoring)  3. Hyperlipidemia -Tenuous simvastatin pending results of lab work - BMP8+EGFR - Hepatic function panel - CBC with Differential/Platelet - NMR, lipoprofile  4. Hypothyroidism, unspecified hypothyroidism type -Continue with current thyroid replacement pending results of lab work - CBC with Differential/Platelet - Thyroid Panel With TSH  5. PULMONARY FIBROSIS ILD POST INFLAMMATORY CHRONIC -Follow-up with pulmonology locally and at the [facility] as planned  Meds ordered this encounter  Medications  . sulfamethoxazole-trimethoprim (BACTRIM DS,SEPTRA DS) 800-160 MG per tablet    Sig: Take 1 tablet by mouth 3 (three) times a week.   Patient Instructions                       Medicare Annual Wellness Visit  [place] and the medical providers at [facility] strive to bring you the best medical care.  In doing so we not only want to address  your current medical conditions and concerns but also to detect new conditions early and prevent illness, disease and health-related problems.    Medicare offers a yearly Wellness Visit which allows our clinical staff to assess your need for preventative services including immunizations, lifestyle education, counseling to decrease risk of preventable diseases and screening for fall risk and other medical concerns.    This visit is provided free of charge (no copay) for all Medicare recipients. The clinical pharmacists at [place] have begun to conduct these Wellness Visits which will also include a thorough review of all your medications.    As you primary medical provider recommend that you make an appointment for your Annual Wellness Visit if  you have not done so already this year.  You may set up this appointment before you leave today or you may call back ([phone]) and schedule an appointment.  Please make sure when you call that you mention that you are scheduling your Annual Wellness Visit with the clinical pharmacist so that the appointment may be made for the proper length of time.     Continue current medications. Continue good therapeutic lifestyle changes which include good diet and exercise. Fall precautions discussed with patient. If an FOBT was given today- please return it to our front desk. If you are over 24 years old - you may need Prevnar 54 or the adult Pneumonia vaccine.  **Flu shots will be available soon--- please call and schedule a FLU-CLINIC appointment**  After your visit with [place] today you will receive a survey in the mail or online from [employer] regarding your care with [place]. Please take a moment to fill this out. Your feedback is very important to [place] as you can help [place] better understand your patient needs as well as improve your experience and satisfaction. WE CARE ABOUT YOU!!!   **Please join [place] [email or web address], 2016 from 5:00 to 7:00pm for our OPEN HOUSE! Come out and meet our NEW providers**  Follow-up with pulmonologist as planned This winter and fall drink plenty of fluids and stay well hydrated Avoid situations where you may be exposed to other people that are very sick Always wear respiratory protection and use hand cleansers Continue to use Mucinex for cough and congestion and use inhalers that you have at home on a regular basis Also follow-up with orthopedist as planned for back injections We will call you with your lab work results once it becomes available   Arrie Senate MD

## 2014-12-19 NOTE — Patient Instructions (Addendum)
Medicare Annual Wellness Visit  [place] and the medical providers at [facility] strive to bring you the best medical care.  In doing so we not only want to address your current medical conditions and concerns but also to detect new conditions early and prevent illness, disease and health-related problems.    Medicare offers a yearly Wellness Visit which allows our clinical staff to assess your need for preventative services including immunizations, lifestyle education, counseling to decrease risk of preventable diseases and screening for fall risk and other medical concerns.    This visit is provided free of charge (no copay) for all Medicare recipients. The clinical pharmacists at [facility] have begun to conduct these Wellness Visits which will also include a thorough review of all your medications.    As you primary medical provider recommend that you make an appointment for your Annual Wellness Visit if you have not done so already this year.  You may set up this appointment before you leave today or you may call back [phone]) and schedule an appointment.  Please make sure when you call that you mention that you are scheduling your Annual Wellness Visit with the clinical pharmacist so that the appointment may be made for the proper length of time.     Continue current medications. Continue good therapeutic lifestyle changes which include good diet and exercise. Fall precautions discussed with patient. If an FOBT was given today- please return it to our front desk. If you are over 5 years old - you may need Prevnar 13 or the adult Pneumonia vaccine.  **Flu shots will be available soon--- please call and schedule a FLU-CLINIC appointment**  After your visit with [place] today you will receive a survey in the mail or online from [employer] regarding your care with [place]. Please take a moment to fill this out. Your feedback is  very important to [place] as you can help [place] better understand your patient needs as well as improve your experience and satisfaction. WE CARE ABOUT YOU!!!   **Please join [place] SEPT.[email or web address], 2016 from 5:00 to 7:00pm for our OPEN HOUSE! Come out and meet our NEW providers**  Follow-up with pulmonologist as planned This winter and fall drink plenty of fluids and stay well hydrated Avoid situations where you may be exposed to other people that are very sick Always wear respiratory protection and use hand cleansers Continue to use Mucinex for cough and congestion and use inhalers that you have at home on a regular basis Also follow-up with orthopedist as planned for back injections We will call you with your lab work results once it becomes available

## 2014-12-20 LAB — BMP8+EGFR
BUN/Creatinine Ratio: 21 (ref 10–22)
BUN: 18 mg/dL (ref 8–27)
CO2: 25 mmol/L (ref 18–29)
Calcium: 9.1 mg/dL (ref 8.6–10.2)
Chloride: 94 mmol/L — ABNORMAL LOW (ref 97–108)
Creatinine, Ser: 0.87 mg/dL (ref 0.76–1.27)
GFR calc Af Amer: 92 mL/min/{1.73_m2} (ref >59)
GFR, EST NON AFRICAN AMERICAN: 80 mL/min/{1.73_m2} (ref >59)
Glucose: 113 mg/dL — ABNORMAL HIGH (ref 65–99)
Potassium: 4.2 mmol/L (ref 3.5–5.2)
SODIUM: 136 mmol/L (ref 134–144)

## 2014-12-20 LAB — THYROID PANEL WITH TSH
Free Thyroxine Index: 2.2 (ref 1.2–4.9)
T3 Uptake Ratio: 32 % (ref 24–39)
T4 TOTAL: 6.8 ug/dL (ref 4.5–12.0)
TSH: 13.64 u[IU]/mL — AB (ref 0.450–4.500)

## 2014-12-20 LAB — NMR, LIPOPROFILE
Cholesterol: 196 mg/dL (ref 100–199)
HDL CHOLESTEROL BY NMR: 86 mg/dL (ref >39)
HDL PARTICLE NUMBER: 40.6 umol/L (ref >=30.5)
LDL Particle Number: 898 nmol/L (ref <1000)
LDL SIZE: 21.1 nm (ref >20.5)
LDL-C: 85 mg/dL (ref 0–99)
LP-IR Score: <25 (ref <=45)
Triglycerides by NMR: 127 mg/dL (ref 0–149)

## 2014-12-20 LAB — CBC WITH DIFFERENTIAL/PLATELET
Basophils Absolute: 0 10*3/uL (ref 0.0–0.2)
Basos: 0 %
EOS (ABSOLUTE): 0.3 10*3/uL (ref 0.0–0.4)
Eos: 3 %
HEMATOCRIT: 41 % (ref 37.5–51.0)
HEMOGLOBIN: 13.7 g/dL (ref 12.6–17.7)
Immature Grans (Abs): 0 10*3/uL (ref 0.0–0.1)
Immature Granulocytes: 0 %
Lymphocytes Absolute: 1.5 10*3/uL (ref 0.7–3.1)
Lymphs: 18 %
MCH: 30.2 pg (ref 26.6–33.0)
MCHC: 33.4 g/dL (ref 31.5–35.7)
MCV: 91 fL (ref 79–97)
Monocytes Absolute: 0.5 10*3/uL (ref 0.1–0.9)
Monocytes: 6 %
Neutrophils Absolute: 5.9 10*3/uL (ref 1.4–7.0)
Neutrophils: 73 %
Platelets: 181 10*3/uL (ref 150–379)
RBC: 4.53 x10E6/uL (ref 4.14–5.80)
RDW: 14.3 % (ref 12.3–15.4)
WBC: 8.2 10*3/uL (ref 3.4–10.8)

## 2014-12-20 LAB — VITAMIN D 25 HYDROXY (VIT D DEFICIENCY, FRACTURES): VIT D 25 HYDROXY: 42.9 ng/mL (ref 30.0–100.0)

## 2014-12-20 LAB — HEPATIC FUNCTION PANEL
ALT: 24 IU/L (ref 0–44)
AST: 30 IU/L (ref 0–40)
Albumin: 3.9 g/dL (ref 3.5–4.7)
Alkaline Phosphatase: 59 IU/L (ref 39–117)
Bilirubin Total: 0.9 mg/dL (ref 0.0–1.2)
Bilirubin, Direct: 0.31 mg/dL (ref 0.00–0.40)
Total Protein: 6.3 g/dL (ref 6.0–8.5)

## 2015-01-29 ENCOUNTER — Other Ambulatory Visit (INDEPENDENT_AMBULATORY_CARE_PROVIDER_SITE_OTHER): Payer: Medicare Other

## 2015-01-29 DIAGNOSIS — Z23 Encounter for immunization: Secondary | ICD-10-CM

## 2015-01-29 DIAGNOSIS — E038 Other specified hypothyroidism: Secondary | ICD-10-CM

## 2015-01-29 NOTE — Progress Notes (Signed)
Lab only 

## 2015-01-30 LAB — THYROID PANEL WITH TSH
Free Thyroxine Index: 3 (ref 1.2–4.9)
T3 Uptake Ratio: 33 % (ref 24–39)
T4, Total: 9.1 ug/dL (ref 4.5–12.0)
TSH: 3.97 u[IU]/mL (ref 0.450–4.500)

## 2015-03-04 LAB — PULMONARY FUNCTION TEST

## 2015-04-23 ENCOUNTER — Other Ambulatory Visit: Payer: Medicare Other

## 2015-04-23 DIAGNOSIS — E039 Hypothyroidism, unspecified: Secondary | ICD-10-CM

## 2015-04-23 DIAGNOSIS — E559 Vitamin D deficiency, unspecified: Secondary | ICD-10-CM

## 2015-04-23 DIAGNOSIS — E785 Hyperlipidemia, unspecified: Secondary | ICD-10-CM

## 2015-04-23 DIAGNOSIS — N400 Enlarged prostate without lower urinary tract symptoms: Secondary | ICD-10-CM

## 2015-04-24 LAB — NMR, LIPOPROFILE
CHOLESTEROL: 169 mg/dL (ref 100–199)
HDL Cholesterol by NMR: 86 mg/dL (ref >39)
HDL PARTICLE NUMBER: 34.4 umol/L (ref >=30.5)
LDL Particle Number: 687 nmol/L (ref <1000)
LDL Size: 21 nm (ref >20.5)
LDL-C: 66 mg/dL (ref 0–99)
Triglycerides by NMR: 84 mg/dL (ref 0–149)

## 2015-04-24 LAB — CBC WITH DIFFERENTIAL/PLATELET
BASOS: 0 %
Basophils Absolute: 0 10*3/uL (ref 0.0–0.2)
EOS (ABSOLUTE): 0.2 10*3/uL (ref 0.0–0.4)
EOS: 3 %
HEMATOCRIT: 41.3 % (ref 37.5–51.0)
HEMOGLOBIN: 13.3 g/dL (ref 12.6–17.7)
Immature Grans (Abs): 0 10*3/uL (ref 0.0–0.1)
Immature Granulocytes: 0 %
LYMPHS ABS: 2.3 10*3/uL (ref 0.7–3.1)
Lymphs: 27 %
MCH: 29.6 pg (ref 26.6–33.0)
MCHC: 32.2 g/dL (ref 31.5–35.7)
MCV: 92 fL (ref 79–97)
MONOCYTES: 8 %
MONOS ABS: 0.7 10*3/uL (ref 0.1–0.9)
NEUTROS ABS: 5.2 10*3/uL (ref 1.4–7.0)
Neutrophils: 62 %
Platelets: 251 10*3/uL (ref 150–379)
RBC: 4.49 x10E6/uL (ref 4.14–5.80)
RDW: 14.1 % (ref 12.3–15.4)
WBC: 8.3 10*3/uL (ref 3.4–10.8)

## 2015-04-24 LAB — BMP8+EGFR
BUN / CREAT RATIO: 17 (ref 10–22)
BUN: 14 mg/dL (ref 8–27)
CO2: 24 mmol/L (ref 18–29)
CREATININE: 0.81 mg/dL (ref 0.76–1.27)
Calcium: 9 mg/dL (ref 8.6–10.2)
Chloride: 96 mmol/L (ref 96–106)
GFR calc non Af Amer: 82 mL/min/{1.73_m2} (ref >59)
GFR, EST AFRICAN AMERICAN: 95 mL/min/{1.73_m2} (ref >59)
GLUCOSE: 93 mg/dL (ref 65–99)
Potassium: 3.8 mmol/L (ref 3.5–5.2)
SODIUM: 138 mmol/L (ref 134–144)

## 2015-04-24 LAB — THYROID PANEL WITH TSH
Free Thyroxine Index: 3.3 (ref 1.2–4.9)
T3 UPTAKE RATIO: 32 % (ref 24–39)
T4 TOTAL: 10.2 ug/dL (ref 4.5–12.0)
TSH: 2.69 u[IU]/mL (ref 0.450–4.500)

## 2015-04-24 LAB — HEPATIC FUNCTION PANEL
ALK PHOS: 57 IU/L (ref 39–117)
ALT: 15 IU/L (ref 0–44)
AST: 25 IU/L (ref 0–40)
Albumin: 4.1 g/dL (ref 3.5–4.7)
Bilirubin Total: 0.5 mg/dL (ref 0.0–1.2)
Bilirubin, Direct: 0.17 mg/dL (ref 0.00–0.40)
Total Protein: 6.5 g/dL (ref 6.0–8.5)

## 2015-04-24 LAB — VITAMIN D 25 HYDROXY (VIT D DEFICIENCY, FRACTURES): VIT D 25 HYDROXY: 45.8 ng/mL (ref 30.0–100.0)

## 2015-04-28 ENCOUNTER — Encounter: Payer: Self-pay | Admitting: Family Medicine

## 2015-04-28 ENCOUNTER — Ambulatory Visit (INDEPENDENT_AMBULATORY_CARE_PROVIDER_SITE_OTHER): Payer: Medicare Other | Admitting: Family Medicine

## 2015-04-28 VITALS — BP 107/66 | HR 88 | Temp 97.3°F | Ht 65.0 in | Wt 175.0 lb

## 2015-04-28 DIAGNOSIS — J8410 Pulmonary fibrosis, unspecified: Secondary | ICD-10-CM

## 2015-04-28 DIAGNOSIS — E785 Hyperlipidemia, unspecified: Secondary | ICD-10-CM

## 2015-04-28 DIAGNOSIS — E038 Other specified hypothyroidism: Secondary | ICD-10-CM | POA: Diagnosis not present

## 2015-04-28 DIAGNOSIS — E559 Vitamin D deficiency, unspecified: Secondary | ICD-10-CM

## 2015-04-28 DIAGNOSIS — N400 Enlarged prostate without lower urinary tract symptoms: Secondary | ICD-10-CM

## 2015-04-28 DIAGNOSIS — E039 Hypothyroidism, unspecified: Secondary | ICD-10-CM | POA: Diagnosis not present

## 2015-04-28 DIAGNOSIS — I2510 Atherosclerotic heart disease of native coronary artery without angina pectoris: Secondary | ICD-10-CM

## 2015-04-28 DIAGNOSIS — E034 Atrophy of thyroid (acquired): Secondary | ICD-10-CM

## 2015-04-28 NOTE — Progress Notes (Signed)
Subjective:    Patient ID: Raymond [name], male    DOB: [birth year], 82 y.o.   MRN: [id number]  HPI Pt here for follow up and management of chronic medical problems which includes hypothyroid and hyperlipidemia. He is taking medications regularly. The patient is doing as well as suspected but keeps a cough. He sees the pulmonologist regularly for his pulmonary fibrosis. He is due to return an FOBT and he has had his lab work done and we will review this during the visit today. Patient denies chest pain. He has this persistent shortness of breath but has been coughing more recently. The shortness of breath is no worse laying down and sitting up. He is not running any fever and he is not coughing up any purulent sputum. He does take Septra DS 3 times weekly. He is only on 5 mg of prednisone now and has decided to increase that dose for short period of time. He swallowing without problems and having no heartburn or indigestion no blood in the stool or black tarry bowel movements. Passing his water without problems. He is wearing his O2.      Patient Active Problem List   Diagnosis Date Noted  . Vitamin D deficiency 02/19/2013  . BPH (benign prostatic hyperplasia) 09/04/2012  . Allergic rhinitis 09/24/2010  .  Cough, chronic 06/26/2010  . PULMONARY FIBROSIS ILD POST INFLAMMATORY CHRONIC 12/30/2009  . HYPOTHYROIDISM 12/29/2009  . HYPERLIPIDEMIA 12/29/2009  . CORONARY HEART DISEASE 12/29/2009   Outpatient Encounter Prescriptions as of 04/28/2015  Medication Sig  . Cholecalciferol (VITAMIN D) 2000 UNITS CAPS Take 2,000-4,000 Units by mouth daily. Takes 2000 units everyday except for Fri, Sat, and Sun patient takes 4000 units.  Marland Kitchen levothyroxine (SYNTHROID, LEVOTHROID) 125 MCG tablet TAKE ONE TABLET BY MOUTH EVERY DAY as directed  . levothyroxine (SYNTHROID, LEVOTHROID) 25 MCG tablet Take 1 tablet (25 mcg total) by mouth daily before breakfast.  . mycophenolate (CELLCEPT) 500 MG tablet Take 2  tablets by mouth 2 (two) times daily.  Marland Kitchen omeprazole (PRILOSEC) 20 MG capsule Take 1 capsule (20 mg total) by mouth every evening.  . predniSONE (DELTASONE) 10 MG tablet Take 5 mg by mouth daily.   . simvastatin (ZOCOR) 80 MG tablet Take 1 tablet (80 mg total) by mouth every evening.  . sulfamethoxazole-trimethoprim (BACTRIM DS,SEPTRA DS) 800-160 MG per tablet Take 1 tablet by mouth 3 (three) times a week.  . terazosin (HYTRIN) 10 MG capsule Take 1 capsule (10 mg total) by mouth at bedtime.  . citalopram (CELEXA) 20 MG tablet Take 1 tablet by mouth daily.   No facility-administered encounter medications on file as of 04/28/2015.      Review of Systems  Constitutional: Negative.   HENT: Negative.   Eyes: Negative.   Respiratory: Positive for cough.   Cardiovascular: Negative.   Gastrointestinal: Negative.   Endocrine: Negative.   Genitourinary: Negative.   Musculoskeletal: Negative.   Skin: Negative.   Allergic/Immunologic: Negative.   Neurological: Negative.   Hematological: Negative.   Psychiatric/Behavioral: Negative.        Objective:   Physical Exam  Constitutional: He is oriented to person, place, and time. He appears well-developed and well-nourished. No distress.  Despite all of his breathing issues the patient is alert and pleasant.  HENT:  Head: Normocephalic and atraumatic.  Right Ear: External ear normal.  Left Ear: External ear normal.  Mouth/Throat: Oropharynx is clear and moist. No oropharyngeal exudate.  Slight nasal congestion left greater than right  Eyes: Conjunctivae and EOM are normal. Pupils are equal, round, and reactive to light. Right eye exhibits no discharge. Left eye exhibits no discharge. No scleral icterus.  Neck: Normal range of motion. Neck supple. No thyromegaly present.  No anterior cervical adenopathy  Cardiovascular: Normal rate, regular rhythm and normal heart sounds.   No murmur heard. The heart has a regular rate and rhythm at about  88/m.  Pulmonary/Chest: Effort normal. No respiratory distress. He has wheezes. He has no rales. He exhibits no tenderness.  Decreased breath sounds bilaterally no rales rare wheezes.  Abdominal: Soft. Bowel sounds are normal. He exhibits no mass. There is no tenderness. There is no rebound and no guarding.  Nontender without masses or enlargement  Musculoskeletal: Normal range of motion. He exhibits no edema or tenderness.  Lymphadenopathy:    He has no cervical adenopathy.  Neurological: He is alert and oriented to person, place, and time. He has normal reflexes. No cranial nerve deficit.  Skin: Skin is warm and dry. No rash noted.  Psychiatric: He has a normal mood and affect. His behavior is normal. Judgment and thought content normal.  Nursing note and vitals reviewed.  BP 107/66 mmHg  Pulse 88  Temp(Src) 97.3 F (36.3 C) (Oral)  Ht  (1.651 m)  Wt 175 lb (79.379 kg)  BMI 29.12 kg/m2        Assessment & Plan:  1. Vitamin D deficiency -Continue current treatment  2. Hyperlipidemia -Cholesterol numbers were good and the patient should continue with current treatment of simvastatin  3. Hypothyroidism, unspecified hypothyroidism type -All thyroid numbers are good and patient should continue with current treatment  4. BPH (benign prostatic hyperplasia) -The patient has no complaints with this today  5. PULMONARY FIBROSIS ILD POST INFLAMMATORY CHRONIC -He should continue to follow-up with his pulmonologist and follow their recommendations  6. Hypothyroidism due to acquired atrophy of thyroid -Continue current treatment  7. Hyperlipidemia, acquired -Cholesterol numbers were excellent on recent lab work he should continue with current treatment  8. Atherosclerosis of native coronary artery of native heart without angina pectoris -Continue aggressive therapeutic lifestyle changes as much as possible  Patient Instructions                       Medicare Annual  Wellness Visit  [place] and the medical providers at [facility] strive to bring you the best medical care.  In doing so we not only want to address your current medical conditions and concerns but also to detect new conditions early and prevent illness, disease and health-related problems.    Medicare offers a yearly Wellness Visit which allows our clinical staff to assess your need for preventative services including immunizations, lifestyle education, counseling to decrease risk of preventable diseases and screening for fall risk and other medical concerns.    This visit is provided free of charge (no copay) for all Medicare recipients. The clinical pharmacists at [facility] have begun to conduct these Wellness Visits which will also include a thorough review of all your medications.    As you primary medical provider recommend that you make an appointment for your Annual Wellness Visit if you have not done so already this year.  You may set up this appointment before you leave today or you may call back ([phone]) and schedule an appointment.  Please make sure when you call that you mention that you are scheduling your Annual Wellness Visit  with the clinical pharmacist so that the appointment may be made for the proper length of time.     Continue current medications. Continue good therapeutic lifestyle changes which include good diet and exercise. Fall precautions discussed with patient. If an FOBT was given today- please return it to our front desk. If you are over 73 years old - you may need Prevnar 13 or the adult Pneumonia vaccine.  **Flu shots are available--- please call and schedule a FLU-CLINIC appointment**  After your visit with [place] today you will receive a survey in the mail or online from [employer] regarding your care with [place]. Please take a moment to fill this out. Your feedback is very important to [place] as you can help [place] better  understand your patient needs as well as improve your experience and satisfaction. WE CARE ABOUT YOU!!!   Continue mucinex and prednisone as directed Call us if no better in 1-2 weeks.  Increase prednisone as directed Use cool mist humidification and take Mucinex and use nasal saline   Nyra Capes MD

## 2015-04-28 NOTE — Patient Instructions (Addendum)
Medicare Annual Wellness Visit  [place] and the medical providers at [facility] strive to bring you the best medical care.  In doing so we not only want to address your current medical conditions and concerns but also to detect new conditions early and prevent illness, disease and health-related problems.    Medicare offers a yearly Wellness Visit which allows our clinical staff to assess your need for preventative services including immunizations, lifestyle education, counseling to decrease risk of preventable diseases and screening for fall risk and other medical concerns.    This visit is provided free of charge (no copay) for all Medicare recipients. The clinical pharmacists at [facility] have begun to conduct these Wellness Visits which will also include a thorough review of all your medications.    As you primary medical provider recommend that you make an appointment for your Annual Wellness Visit if you have not done so already this year.  You may set up this appointment before you leave today or you may call back ([phone]) and schedule an appointment.  Please make sure when you call that you mention that you are scheduling your Annual Wellness Visit with the clinical pharmacist so that the appointment may be made for the proper length of time.     Continue current medications. Continue good therapeutic lifestyle changes which include good diet and exercise. Fall precautions discussed with patient. If an FOBT was given today- please return it to our front desk. If you are over 45 years old - you may need Prevnar 13 or the adult Pneumonia vaccine.  **Flu shots are available--- please call and schedule a FLU-CLINIC appointment**  After your visit with [place] today you will receive a survey in the mail or online from [employer] regarding your care with [place]. Please take a moment to fill this out. Your feedback is very  important to [place] as you can help [place] better understand your patient needs as well as improve your experience and satisfaction. WE CARE ABOUT YOU!!!   Continue mucinex and prednisone as directed Call us if no better in 1-2 weeks.  Increase prednisone as directed Use cool mist humidification and take Mucinex and use nasal saline

## 2015-05-05 ENCOUNTER — Other Ambulatory Visit: Payer: Medicare Other

## 2015-05-05 DIAGNOSIS — Z1212 Encounter for screening for malignant neoplasm of rectum: Secondary | ICD-10-CM

## 2015-05-05 NOTE — Progress Notes (Signed)
Lab only 

## 2015-05-08 LAB — FECAL OCCULT BLOOD, IMMUNOCHEMICAL: FECAL OCCULT BLD: NEGATIVE

## 2015-06-26 ENCOUNTER — Telehealth: Payer: Self-pay | Admitting: Family Medicine

## 2015-06-26 MED ORDER — OMEPRAZOLE 20 MG PO CPDR: 20.0000 mg | DELAYED_RELEASE_CAPSULE | Freq: Every evening | ORAL | Status: AC

## 2015-06-26 MED ORDER — TERAZOSIN HCL 10 MG PO CAPS: 10.0000 mg | ORAL_CAPSULE | Freq: Every day | ORAL | Status: AC

## 2015-06-26 MED ORDER — LEVOTHYROXINE SODIUM 25 MCG PO TABS: 25.0000 ug | ORAL_TABLET | Freq: Every day | ORAL | Status: DC

## 2015-06-26 MED ORDER — LEVOTHYROXINE SODIUM 125 MCG PO TABS: ORAL_TABLET | ORAL | Status: DC

## 2015-06-26 MED ORDER — SIMVASTATIN 80 MG PO TABS: 80.0000 mg | ORAL_TABLET | Freq: Every evening | ORAL | Status: DC

## 2015-06-26 MED ORDER — CITALOPRAM HYDROBROMIDE 20 MG PO TABS: 20.0000 mg | ORAL_TABLET | Freq: Every day | ORAL | Status: AC

## 2015-06-26 NOTE — Telephone Encounter (Signed)
Spoke to [place] and asked which rx's needed to be sent and he states that Asher MuirJamie has everything written down and know exactly what needs to be sent.

## 2015-07-21 ENCOUNTER — Emergency Department (HOSPITAL_COMMUNITY): Payer: Medicare Other

## 2015-07-21 ENCOUNTER — Encounter (HOSPITAL_COMMUNITY): Payer: Self-pay

## 2015-07-21 ENCOUNTER — Inpatient Hospital Stay (HOSPITAL_COMMUNITY)
Admission: EM | Admit: 2015-07-21 | Discharge: 2015-07-25 | DRG: 190 | Disposition: A | Discharge status: Home / Self Care | Payer: Medicare Other | Attending: Internal Medicine | Admitting: Internal Medicine

## 2015-07-21 DIAGNOSIS — N400 Enlarged prostate without lower urinary tract symptoms: Secondary | ICD-10-CM | POA: Diagnosis present

## 2015-07-21 DIAGNOSIS — R0602 Shortness of breath: Secondary | ICD-10-CM

## 2015-07-21 DIAGNOSIS — R739 Hyperglycemia, unspecified: Secondary | ICD-10-CM | POA: Diagnosis present

## 2015-07-21 DIAGNOSIS — T380X5A Adverse effect of glucocorticoids and synthetic analogues, initial encounter: Secondary | ICD-10-CM | POA: Diagnosis present

## 2015-07-21 DIAGNOSIS — Z7982 Long term (current) use of aspirin: Secondary | ICD-10-CM | POA: Diagnosis not present

## 2015-07-21 DIAGNOSIS — J9622 Acute and chronic respiratory failure with hypercapnia: Secondary | ICD-10-CM

## 2015-07-21 DIAGNOSIS — J9621 Acute and chronic respiratory failure with hypoxia: Secondary | ICD-10-CM | POA: Diagnosis present

## 2015-07-21 DIAGNOSIS — Z87891 Personal history of nicotine dependence: Secondary | ICD-10-CM

## 2015-07-21 DIAGNOSIS — E785 Hyperlipidemia, unspecified: Secondary | ICD-10-CM | POA: Diagnosis present

## 2015-07-21 DIAGNOSIS — J189 Pneumonia, unspecified organism: Secondary | ICD-10-CM | POA: Diagnosis present

## 2015-07-21 DIAGNOSIS — K219 Gastro-esophageal reflux disease without esophagitis: Secondary | ICD-10-CM | POA: Diagnosis present

## 2015-07-21 DIAGNOSIS — Z8249 Family history of ischemic heart disease and other diseases of the circulatory system: Secondary | ICD-10-CM

## 2015-07-21 DIAGNOSIS — E038 Other specified hypothyroidism: Secondary | ICD-10-CM | POA: Diagnosis not present

## 2015-07-21 DIAGNOSIS — R7989 Other specified abnormal findings of blood chemistry: Secondary | ICD-10-CM | POA: Diagnosis present

## 2015-07-21 DIAGNOSIS — Z823 Family history of stroke: Secondary | ICD-10-CM | POA: Diagnosis not present

## 2015-07-21 DIAGNOSIS — J441 Chronic obstructive pulmonary disease with (acute) exacerbation: Secondary | ICD-10-CM | POA: Diagnosis present

## 2015-07-21 DIAGNOSIS — E039 Hypothyroidism, unspecified: Secondary | ICD-10-CM | POA: Diagnosis present

## 2015-07-21 DIAGNOSIS — R945 Abnormal results of liver function studies: Secondary | ICD-10-CM

## 2015-07-21 DIAGNOSIS — Z9981 Dependence on supplemental oxygen: Secondary | ICD-10-CM

## 2015-07-21 DIAGNOSIS — Z809 Family history of malignant neoplasm, unspecified: Secondary | ICD-10-CM

## 2015-07-21 DIAGNOSIS — T50905A Adverse effect of unspecified drugs, medicaments and biological substances, initial encounter: Secondary | ICD-10-CM | POA: Diagnosis present

## 2015-07-21 DIAGNOSIS — I2510 Atherosclerotic heart disease of native coronary artery without angina pectoris: Secondary | ICD-10-CM | POA: Diagnosis present

## 2015-07-21 DIAGNOSIS — J440 Chronic obstructive pulmonary disease with acute lower respiratory infection: Secondary | ICD-10-CM | POA: Diagnosis present

## 2015-07-21 DIAGNOSIS — J8410 Pulmonary fibrosis, unspecified: Secondary | ICD-10-CM

## 2015-07-21 DIAGNOSIS — J9620 Acute and chronic respiratory failure, unspecified whether with hypoxia or hypercapnia: Secondary | ICD-10-CM | POA: Diagnosis present

## 2015-07-21 DIAGNOSIS — Z7952 Long term (current) use of systemic steroids: Secondary | ICD-10-CM | POA: Diagnosis not present

## 2015-07-21 DIAGNOSIS — D72829 Elevated white blood cell count, unspecified: Secondary | ICD-10-CM | POA: Diagnosis present

## 2015-07-21 LAB — COMPREHENSIVE METABOLIC PANEL
ALT: 132 U/L — ABNORMAL HIGH (ref 17–63)
AST: 77 U/L — ABNORMAL HIGH (ref 15–41)
Albumin: 3.2 g/dL — ABNORMAL LOW (ref 3.5–5.0)
Alkaline Phosphatase: 49 U/L (ref 38–126)
Anion gap: 14 (ref 5–15)
BUN: 16 mg/dL (ref 6–20)
CALCIUM: 9.2 mg/dL (ref 8.9–10.3)
CO2: 29 mmol/L (ref 22–32)
CREATININE: 0.66 mg/dL (ref 0.61–1.24)
Chloride: 95 mmol/L — ABNORMAL LOW (ref 101–111)
Glucose, Bld: 144 mg/dL — ABNORMAL HIGH (ref 65–99)
Potassium: 3.7 mmol/L (ref 3.5–5.1)
Sodium: 138 mmol/L (ref 135–145)
Total Bilirubin: 1.1 mg/dL (ref 0.3–1.2)
Total Protein: 6.9 g/dL (ref 6.5–8.1)

## 2015-07-21 LAB — CBC
HEMATOCRIT: 36.4 % — AB (ref 39.0–52.0)
HEMOGLOBIN: 11.6 g/dL — AB (ref 13.0–17.0)
MCH: 29.4 pg (ref 26.0–34.0)
MCHC: 31.9 g/dL (ref 30.0–36.0)
MCV: 92.2 fL (ref 78.0–100.0)
Platelets: 230 10*3/uL (ref 150–400)
RBC: 3.95 MIL/uL — ABNORMAL LOW (ref 4.22–5.81)
RDW: 14.4 % (ref 11.5–15.5)
WBC: 17.3 10*3/uL — AB (ref 4.0–10.5)

## 2015-07-21 LAB — CBC WITH DIFFERENTIAL/PLATELET
Basophils Absolute: 0 10*3/uL (ref 0.0–0.1)
Basophils Relative: 0 %
EOS PCT: 0 %
Eosinophils Absolute: 0 10*3/uL (ref 0.0–0.7)
HCT: 39.8 % (ref 39.0–52.0)
Hemoglobin: 12.9 g/dL — ABNORMAL LOW (ref 13.0–17.0)
LYMPHS ABS: 1.1 10*3/uL (ref 0.7–4.0)
LYMPHS PCT: 5 %
MCH: 29.9 pg (ref 26.0–34.0)
MCHC: 32.4 g/dL (ref 30.0–36.0)
MCV: 92.1 fL (ref 78.0–100.0)
MONO ABS: 1.4 10*3/uL — AB (ref 0.1–1.0)
Monocytes Relative: 7 %
Neutro Abs: 18.4 10*3/uL — ABNORMAL HIGH (ref 1.7–7.7)
Neutrophils Relative %: 88 %
PLATELETS: 235 10*3/uL (ref 150–400)
RBC: 4.32 MIL/uL (ref 4.22–5.81)
RDW: 14.3 % (ref 11.5–15.5)
WBC: 20.8 10*3/uL — ABNORMAL HIGH (ref 4.0–10.5)

## 2015-07-21 LAB — URINALYSIS, ROUTINE W REFLEX MICROSCOPIC
GLUCOSE, UA: 100 mg/dL — AB
Leukocytes, UA: NEGATIVE
Nitrite: NEGATIVE
PH: 5.5 (ref 5.0–8.0)
PROTEIN: 30 mg/dL — AB
Specific Gravity, Urine: >1.03 — ABNORMAL HIGH (ref 1.005–1.030)

## 2015-07-21 LAB — URINE MICROSCOPIC-ADD ON

## 2015-07-21 LAB — TSH: TSH: 1.588 u[IU]/mL (ref 0.350–4.500)

## 2015-07-21 LAB — INFLUENZA PANEL BY PCR (TYPE A & B)
H1N1 flu by pcr: NOT DETECTED
INFLAPCR: NEGATIVE
INFLBPCR: NEGATIVE

## 2015-07-21 LAB — CREATININE, SERUM: CREATININE: 0.96 mg/dL (ref 0.61–1.24)

## 2015-07-21 LAB — TROPONIN I

## 2015-07-21 LAB — BRAIN NATRIURETIC PEPTIDE: B Natriuretic Peptide: 77 pg/mL (ref 0.0–100.0)

## 2015-07-21 LAB — STREP PNEUMONIAE URINARY ANTIGEN: STREP PNEUMO URINARY ANTIGEN: NEGATIVE

## 2015-07-21 MED ORDER — MYCOPHENOLATE MOFETIL 500 MG PO TABS
1000.0000 mg | ORAL_TABLET | Freq: Two times a day (BID) | ORAL | Status: DC
  Filled 2015-07-21 (×4): qty 2

## 2015-07-21 MED ORDER — PANTOPRAZOLE SODIUM 40 MG PO TBEC
40.0000 mg | DELAYED_RELEASE_TABLET | Freq: Every day | ORAL | Status: DC
  Administered 2015-07-21 – 2015-07-25 (×5): 40 mg via ORAL
  Filled 2015-07-21 (×5): qty 1

## 2015-07-21 MED ORDER — SODIUM CHLORIDE 0.9 % IV SOLN: 250.0000 mL | INTRAVENOUS | Status: DC | PRN

## 2015-07-21 MED ORDER — ENOXAPARIN SODIUM 40 MG/0.4ML ~~LOC~~ SOLN
40.0000 mg | SUBCUTANEOUS | Status: DC
  Administered 2015-07-21 – 2015-07-24 (×4): 40 mg via SUBCUTANEOUS
  Filled 2015-07-21 (×4): qty 0.4

## 2015-07-21 MED ORDER — VITAMIN D 50 MCG (2000 UT) PO CAPS: 2000.0000 [IU] | ORAL_CAPSULE | Freq: Every day | ORAL | Status: DC

## 2015-07-21 MED ORDER — TERAZOSIN HCL 5 MG PO CAPS
10.0000 mg | ORAL_CAPSULE | Freq: Every day | ORAL | Status: DC
  Administered 2015-07-21 – 2015-07-24 (×4): 10 mg via ORAL
  Filled 2015-07-21 (×4): qty 2

## 2015-07-21 MED ORDER — SENNOSIDES-DOCUSATE SODIUM 8.6-50 MG PO TABS: 1.0000 | ORAL_TABLET | Freq: Every evening | ORAL | Status: DC | PRN

## 2015-07-21 MED ORDER — LEVOTHYROXINE SODIUM 25 MCG PO TABS
125.0000 ug | ORAL_TABLET | Freq: Every day | ORAL | Status: DC
  Administered 2015-07-22 – 2015-07-25 (×4): 125 ug via ORAL
  Filled 2015-07-21 (×4): qty 1

## 2015-07-21 MED ORDER — METHYLPREDNISOLONE SODIUM SUCC 125 MG IJ SOLR
60.0000 mg | Freq: Two times a day (BID) | INTRAMUSCULAR | Status: DC
  Administered 2015-07-21 – 2015-07-25 (×8): 60 mg via INTRAVENOUS
  Filled 2015-07-21 (×8): qty 2

## 2015-07-21 MED ORDER — VITAMIN D 1000 UNITS PO TABS
2000.0000 [IU] | ORAL_TABLET | ORAL | Status: DC
  Administered 2015-07-22 – 2015-07-24 (×3): 2000 [IU] via ORAL
  Filled 2015-07-21 (×3): qty 2

## 2015-07-21 MED ORDER — OXYCODONE HCL 5 MG PO TABS: 5.0000 mg | ORAL_TABLET | ORAL | Status: DC | PRN

## 2015-07-21 MED ORDER — SODIUM CHLORIDE 0.9% FLUSH: 3.0000 mL | INTRAVENOUS | Status: DC | PRN

## 2015-07-21 MED ORDER — DEXTROSE 5 % IV SOLN
1.0000 g | Freq: Once | INTRAVENOUS | Status: AC
  Administered 2015-07-21: 1 g via INTRAVENOUS
  Filled 2015-07-21: qty 10

## 2015-07-21 MED ORDER — ACETAMINOPHEN 650 MG RE SUPP: 650.0000 mg | Freq: Four times a day (QID) | RECTAL | Status: DC | PRN

## 2015-07-21 MED ORDER — DEXTROSE 5 % IV SOLN
500.0000 mg | Freq: Once | INTRAVENOUS | Status: AC
  Administered 2015-07-21: 500 mg via INTRAVENOUS
  Filled 2015-07-21: qty 500

## 2015-07-21 MED ORDER — ATORVASTATIN CALCIUM 40 MG PO TABS
40.0000 mg | ORAL_TABLET | Freq: Every day | ORAL | Status: DC
Start: 2015-07-21 — End: 2015-07-24
  Administered 2015-07-21 – 2015-07-23 (×3): 40 mg via ORAL
  Filled 2015-07-21 (×3): qty 1

## 2015-07-21 MED ORDER — ALBUTEROL SULFATE (2.5 MG/3ML) 0.083% IN NEBU
5.0000 mg | INHALATION_SOLUTION | Freq: Once | RESPIRATORY_TRACT | Status: AC
  Administered 2015-07-21: 5 mg via RESPIRATORY_TRACT
  Filled 2015-07-21: qty 6

## 2015-07-21 MED ORDER — DEXTROSE 5 % IV SOLN
500.0000 mg | INTRAVENOUS | Status: DC
  Administered 2015-07-22 – 2015-07-24 (×3): 500 mg via INTRAVENOUS
  Filled 2015-07-21 (×7): qty 500

## 2015-07-21 MED ORDER — DEXTROSE 5 % IV SOLN
1.0000 g | INTRAVENOUS | Status: DC
  Administered 2015-07-22 – 2015-07-24 (×3): 1 g via INTRAVENOUS
  Filled 2015-07-21 (×7): qty 10

## 2015-07-21 MED ORDER — CITALOPRAM HYDROBROMIDE 20 MG PO TABS
20.0000 mg | ORAL_TABLET | Freq: Every day | ORAL | Status: DC
Start: 2015-07-22 — End: 2015-07-25
  Administered 2015-07-22 – 2015-07-25 (×4): 20 mg via ORAL
  Filled 2015-07-21 (×4): qty 1

## 2015-07-21 MED ORDER — ONDANSETRON HCL 4 MG/2ML IJ SOLN: 4.0000 mg | Freq: Four times a day (QID) | INTRAMUSCULAR | Status: DC | PRN

## 2015-07-21 MED ORDER — MYCOPHENOLATE MOFETIL 250 MG PO CAPS
1000.0000 mg | ORAL_CAPSULE | Freq: Two times a day (BID) | ORAL | Status: DC
  Administered 2015-07-21 – 2015-07-25 (×8): 1000 mg via ORAL
  Filled 2015-07-21 (×14): qty 4

## 2015-07-21 MED ORDER — SODIUM CHLORIDE 0.9% FLUSH
3.0000 mL | Freq: Two times a day (BID) | INTRAVENOUS | Status: DC
  Administered 2015-07-21 – 2015-07-25 (×7): 3 mL via INTRAVENOUS

## 2015-07-21 MED ORDER — ACETAMINOPHEN 325 MG PO TABS: 650.0000 mg | ORAL_TABLET | Freq: Four times a day (QID) | ORAL | Status: DC | PRN

## 2015-07-21 MED ORDER — ASPIRIN EC 325 MG PO TBEC
325.0000 mg | DELAYED_RELEASE_TABLET | Freq: Every day | ORAL | Status: DC
  Administered 2015-07-21 – 2015-07-25 (×5): 325 mg via ORAL
  Filled 2015-07-21 (×5): qty 1

## 2015-07-21 MED ORDER — VITAMIN D 1000 UNITS PO TABS
4000.0000 [IU] | ORAL_TABLET | ORAL | Status: DC
  Administered 2015-07-25: 4000 [IU] via ORAL
  Filled 2015-07-21: qty 4

## 2015-07-21 MED ORDER — ONDANSETRON HCL 4 MG PO TABS: 4.0000 mg | ORAL_TABLET | Freq: Four times a day (QID) | ORAL | Status: DC | PRN

## 2015-07-21 MED ORDER — METHYLPREDNISOLONE SODIUM SUCC 125 MG IJ SOLR
125.0000 mg | Freq: Once | INTRAMUSCULAR | Status: AC
  Administered 2015-07-21: 125 mg via INTRAVENOUS
  Filled 2015-07-21: qty 2

## 2015-07-21 NOTE — ED Provider Notes (Signed)
CSN: [id number]     Arrival date & time 07/21/15  8295 History  By signing my name below, I, Marica Otter, attest that this documentation has been prepared under the direction and in the presence of Estela Y Hernandez Acost*. Electronically Signed: Marica Otter, ED Scribe. 07/21/2015. 8:36 AM.   Chief Complaint  Patient presents with  . Shortness of Breath   The history is provided by the patient. No language interpreter was used.   PCP: Rudi Heap, MD HPI Comments: NIHAAL FRIESEN is a 82 y.o. male, who is O2 dependent with 3L/min at baseline and PMHx noted below including COPD, who presents to the Emergency Department complaining of worsening SOB originally onset one month ago and becoming severe last night. Pt reports that his SOB was so bad last night he "felt like I [he] was going to pass out." Pt reports he recently had to increase his home O2 to 4L/min due to SOB. Aggravting factors involve movement. Pt reports he is on prednisone for SOB and states he has been increasing prednisone dosage for the past 30 days due to worsening SOB.  Associated Sx include cold like Sx including fever last night of 101, chills, productive cough with yellow sputum, congestion; additionally, pt also endorses  intermittent chest tightness, back pain and dizziness.  Pt denies rhinorrhea, sore throat,  visual disturbances, n/v/d, dysuria, hematuria, back pain, swelling of legs, headache, lightheadedness, or any new rashes. Pt further denies Hx of bleeding easily/blood thinner use.  Past Medical History  Diagnosis Date  . Hypothyroid   . Hyperlipemia   . BPH (benign prostatic hyperplasia)   . ILD (interstitial lung disease) (HCC)   . COPD (chronic obstructive pulmonary disease) (HCC)   . CAD (coronary artery disease)   . Arthritis   . GERD (gastroesophageal reflux disease)   . Achilles tendon rupture   . Cataracts, bilateral   . Prostate hyperplasia, benign localized, with urinary obstruction     Past Surgical History  Procedure Laterality Date  . Cholecystectomy    . Appendectomy    . Eye surgery      cataracts  . Colonoscopy    . Achilles tendon surgery  08/20/2011    Procedure: ACHILLES TENDON REPAIR;  Surgeon: Sherri Rad, MD;  Location: [facility];  Service: Orthopedics;  Laterality: Right;  Right primary achilles tendon repair, gastroc slide   Family History  Problem Relation Age of Onset  . Heart disease Mother 67    rheumatic heart disease  . Stroke Father   . Hypertension Father   . Cancer Sister     breast  . Hypertension Brother   . Hypertension Brother   . Hypertension Brother    Social History  Substance Use Topics  . Smoking status: Former Smoker -- 2.00 packs/day for 30 years    Types: Cigarettes    Quit date: 03/29/1980  . Smokeless tobacco: Never Used  . Alcohol Use: No    Review of Systems  Constitutional: Positive for fever and chills.  HENT: Positive for congestion. Negative for rhinorrhea and sore throat.   Eyes: Negative for visual disturbance.  Respiratory: Positive for cough, chest tightness and shortness of breath.   Cardiovascular: Negative for chest pain.  Gastrointestinal: Negative for nausea, vomiting, abdominal pain and diarrhea.  Genitourinary: Negative for dysuria.  Musculoskeletal: Positive for back pain.  Skin: Negative for rash.  Neurological: Positive for dizziness. Negative for headaches.  Hematological: Does not bruise/bleed easily.  Psychiatric/Behavioral: Negative for  confusion.      Allergies  Codeine and Levaquin  Home Medications   Prior to Admission medications   Medication Sig Start Date End Date Taking? Authorizing Provider  aspirin EC 325 MG tablet Take 325 mg by mouth daily.   Yes Historical Provider, MD  Cholecalciferol (VITAMIN D) 2000 UNITS CAPS Take 2,000-4,000 Units by mouth daily. Takes 2000 units everyday except for Fri, Sat, and Sun patient takes 4000 units.   Yes Historical  Provider, MD  citalopram (CELEXA) 20 MG tablet Take 1 tablet (20 mg total) by mouth daily. 06/26/15 06/25/16 Yes Ernestina Pennaonald W Moore, MD  levothyroxine (SYNTHROID, LEVOTHROID) 125 MCG tablet TAKE ONE TABLET BY MOUTH EVERY DAY as directed Patient taking differently: Take 125 mcg by mouth daily.  06/26/15  Yes Ernestina Pennaonald W Moore, MD  mycophenolate (CELLCEPT) 500 MG tablet Take 2 tablets by mouth 2 (two) times daily. 07/17/13  Yes Historical Provider, MD  omeprazole (PRILOSEC) 20 MG capsule Take 1 capsule (20 mg total) by mouth every evening. 06/26/15  Yes Ernestina Pennaonald W Moore, MD  predniSONE (DELTASONE) 10 MG tablet Take 5 mg by mouth daily.    Yes Historical Provider, MD  simvastatin (ZOCOR) 80 MG tablet Take 1 tablet (80 mg total) by mouth every evening. 06/26/15  Yes Ernestina Pennaonald W Moore, MD  sulfamethoxazole-trimethoprim (BACTRIM DS,SEPTRA DS) 800-160 MG per tablet Take 1 tablet by mouth 3 (three) times a week.   Yes Historical Provider, MD  terazosin (HYTRIN) 10 MG capsule Take 1 capsule (10 mg total) by mouth at bedtime. 06/26/15  Yes Ernestina Pennaonald W Moore, MD  levothyroxine (SYNTHROID, LEVOTHROID) 25 MCG tablet Take 1 tablet (25 mcg total) by mouth daily before breakfast. Patient not taking: Reported on 07/21/2015 06/26/15   Ernestina Pennaonald W Moore, MD   Triage Vitals: BP 118/64 mmHg  Pulse 85  Temp(Src) 98 F (36.7 C) (Oral)  Resp 26  Ht 5\' 6"  (1.676 m)  Wt 167 lb (75.751 kg)  BMI 26.97 kg/m2  SpO2 95% Physical Exam  Constitutional: He is oriented to person, place, and time. He appears well-developed and well-nourished.  HENT:  Head: Normocephalic and atraumatic.  Mouth/Throat: Mucous membranes are normal.  Eyes: EOM are normal. Pupils are equal, round, and reactive to light. No scleral icterus.  Eyes track normal   Neck: Normal range of motion.  Cardiovascular: Normal rate, regular rhythm, normal heart sounds and intact distal pulses.   Pulmonary/Chest: Effort normal. No respiratory distress. He has wheezes (bilateral ).   Abdominal: Soft. Bowel sounds are normal. He exhibits no distension. There is no tenderness.  Musculoskeletal: Normal range of motion. He exhibits no edema (no swelling of ankles).  Neurological: He is alert and oriented to person, place, and time.  Skin: Skin is warm and dry.  Psychiatric: He has a normal mood and affect. Judgment normal.  Nursing note and vitals reviewed.   ED Course  Procedures (including critical care time) DIAGNOSTIC STUDIES: Oxygen Saturation is 95% on 3L/min.    COORDINATION OF CARE: 8:32 AM: Discussed treatment plan which includes chest CT, labs, with pt at bedside; patient verbalizes understanding and agrees with treatment plan.  Labs Review Labs Reviewed  CBC WITH DIFFERENTIAL/PLATELET - Abnormal; Notable for the following:    WBC 20.8 (*)    Hemoglobin 12.9 (*)    Neutro Abs 18.4 (*)    Monocytes Absolute 1.4 (*)    All other components within normal limits  COMPREHENSIVE METABOLIC PANEL - Abnormal; Notable for the following:  Chloride 95 (*)    Glucose, Bld 144 (*)    Albumin 3.2 (*)    AST 77 (*)    ALT 132 (*)    All other components within normal limits  URINALYSIS, ROUTINE W REFLEX MICROSCOPIC (NOT AT [facility]) - Abnormal; Notable for the following:    Color, Urine AMBER (*)    Specific Gravity, Urine >1.030 (*)    Glucose, UA 100 (*)    Hgb urine dipstick MODERATE (*)    Bilirubin Urine SMALL (*)    Ketones, ur TRACE (*)    Protein, ur 30 (*)    All other components within normal limits  URINE MICROSCOPIC-ADD ON - Abnormal; Notable for the following:    Squamous Epithelial / LPF 0-5 (*)    Bacteria, UA RARE (*)    All other components within normal limits  BRAIN NATRIURETIC PEPTIDE  TROPONIN I  INFLUENZA PANEL BY PCR (TYPE A & B, H1N1)    Imaging Review Dg Chest 2 View  07/21/2015  CLINICAL DATA:  Productive cough, congestion, shortness of breath for 3 days EXAM: CHEST  2 VIEW COMPARISON:  03/27/2012 FINDINGS: Cardiomediastinal  silhouette is stable. There is chronic elevation of the right hemidiaphragm. Progression of reticular peripheral fibrotic changes. No definite superimposed infiltrate or pulmonary edema. Again noted perihilar bronchiectasis. Mild degenerative changes thoracic spine. IMPRESSION: Progression of fibrotic changes. Again noted perihilar bronchiectasis. No definite superimposed infiltrate or pulmonary edema. Electronically Signed   By: Natasha Mead M.D.   On: 07/21/2015 08:44   Ct Chest Wo Contrast  07/21/2015  CLINICAL DATA:  Short of breath for 4 days getting worse EXAM: CT CHEST WITHOUT CONTRAST TECHNIQUE: Multidetector CT imaging of the chest was performed following the standard protocol without IV contrast. COMPARISON:  07/21/2015, 03/27/2012, 01/10/2012 FINDINGS: Mediastinum/Nodes: Mild aortic arch calcification. No aortic dilatation. Sub carinal adenopathy measuring 13 mm. No change in this appearance. No pericardial effusion. Antral posterior elongation of the upper trachea consistent with traction again identified. Lungs/Pleura: There are no pleural effusions. There is dense fibrotic change throughout the right upper lobe. In the medial right middle lobe there is also fibrotic change. There is relatively extensive consolidation and fibrosis with air bronchograms in the inferior half of the right lower lobe. There is milder peripheral fibrosis laterally in the left upper lobe. There is also more mild consolidation at the left base. Upper abdomen: No acute findings Musculoskeletal: No acute findings IMPRESSION: Progression of bilateral pulmonary parenchymal fibrosis right worse than left, when compared to prior CT scan 03/27/2012. Particularly dense consolidation is present in the right lower lobe, with air bronchograms present. The possibility of superimposed pneumonia is not excluded. Electronically Signed   By: Esperanza Heir M.D.   On: 07/21/2015 11:55   I have personally reviewed and evaluated these images  and lab results as part of my medical decision-making.   EKG Interpretation   Date/Time:  Monday July 21 2015 08:11:51 EDT Ventricular Rate:  82 PR Interval:  167 QRS Duration: 104 QT Interval:  363 QTC Calculation: 424 R Axis:   -15 Text Interpretation:  Sinus rhythm Consider right atrial enlargement  Borderline left axis deviation Baseline wander in lead(s) V3  Interpretation limited secondary to artifact Confirmed by Yadir Zentner  MD,  Sumeet Geter [phone]) on 07/21/2015 8:17:10 AM      MDM   Final diagnoses:  Pulmonary fibrosis (HCC)  SOB (shortness of breath)  CAP (community acquired pneumonia)     Patient with known fairly severe  pulmonary fibrosis. Gradual worsening shortness of breath over a month. Gets significantly worse the last few days. Patient arrived with some wheezing improved with nebulizer treatment patient normally on 3 L of oxygen at home. Month himself up to 4. Here on 3 L he would desat down to around 85. That was while in bed without any exertion. Patient kept feeling as if something and got significantly worse in the last few days as if he had pneumonia. Increased productive cough. Chest x-ray had no acute 5 CT of the chest shows significant worsening of his pulmonary fibrosis compared to 2013 and also raised concerns for superimposed pneumonia. Patient has a markedly elevated white blood cell count said he had a fever yesterday but was afebrile here.  Patient started on antibiotics for community-acquired pneumonia no recent admission. 4 L of Oxygen. Will Do a Flu Screen. Discussed with Hospitalist They Will Admit.    I personally performed the services described in this documentation, which was scribed in my presence. The recorded information has been reviewed and is accurate.      Vanetta Mulders, MD 07/21/15 1356

## 2015-07-21 NOTE — H&P (Signed)
History and Physical    Raymond [name]:[id number] DOB: January 20, 1930 DOA: 07/21/2015  Referring MD/NP/PA: Vanetta Mulders, M.D. PCP: Rudi Heap, MD  Patient coming from: Home  Chief Complaint: Shortness of breath  HPI: Raymond [name] is a 82 y.o. male with multiple medical comorbidities, most significant for pulmonary fibrosis and 3-4 L of oxygen, hypothyroidism, hyperlipidemia, BPH who presents to the hospital with a 3-4 day history of increasing shortness of breath. Over the last 2-3 weeks he has noticed that his breathing has gotten worse and he has in fact increase his prednisone from 5 all the way to 15 mg daily. Over the past 48 hours he has noted some subjective fevers and chills, with a cough and a clear nose secretion. Upon arrival to the ED he was noted to have sats of 85% on his home regimen of oxygen while ambulating, CT scan of the chest shows progressive pulmonary fibrosis but is also unable to exclude pneumonia. He has a white count of 20.8. Admission has been requested for further evaluation and management.   Past Medical History  Diagnosis Date  . Hypothyroid   . Hyperlipemia   . BPH (benign prostatic hyperplasia)   . ILD (interstitial lung disease) (HCC)   . COPD (chronic obstructive pulmonary disease) (HCC)   . CAD (coronary artery disease)   . Arthritis   . GERD (gastroesophageal reflux disease)   . Achilles tendon rupture   . Cataracts, bilateral   . Prostate hyperplasia, benign localized, with urinary obstruction     Past Surgical History  Procedure Laterality Date  . Cholecystectomy    . Appendectomy    . Eye surgery      cataracts  . Colonoscopy    . Achilles tendon surgery  08/20/2011    Procedure: ACHILLES TENDON REPAIR;  Surgeon: Sherri Rad, MD;  Location: [facility];  Service: Orthopedics;  Laterality: Right;  Right primary achilles tendon repair, gastroc slide     reports that he quit smoking about 35 years ago. His  smoking use included Cigarettes. He has a 60 pack-year smoking history. He has never used smokeless tobacco. He reports that he does not drink alcohol or use illicit drugs.  Allergies  Allergen Reactions  . Codeine Other (See Comments)    hallucinations  . Levaquin [Levofloxacin In D5w] Other (See Comments)    Tendonitis    Family History  Problem Relation Age of Onset  . Heart disease Mother 43    rheumatic heart disease  . Stroke Father   . Hypertension Father   . Cancer Sister     breast  . Hypertension Brother   . Hypertension Brother   . Hypertension Brother     Prior to Admission medications   Medication Sig Start Date End Date Taking? Authorizing Provider  aspirin EC 325 MG tablet Take 325 mg by mouth daily.   Yes Historical Provider, MD  Cholecalciferol (VITAMIN D) 2000 UNITS CAPS Take 2,000-4,000 Units by mouth daily. Takes 2000 units everyday except for Fri, Sat, and Sun patient takes 4000 units.   Yes Historical Provider, MD  citalopram (CELEXA) 20 MG tablet Take 1 tablet (20 mg total) by mouth daily. 06/26/15 06/25/16 Yes Ernestina Penna, MD  levothyroxine (SYNTHROID, LEVOTHROID) 125 MCG tablet TAKE ONE TABLET BY MOUTH EVERY DAY as directed Patient taking differently: Take 125 mcg by mouth daily.  06/26/15  Yes Ernestina Penna, MD  mycophenolate (CELLCEPT) 500 MG tablet Take 2 tablets by mouth  2 (two) times daily. 07/17/13  Yes Historical Provider, MD  omeprazole (PRILOSEC) 20 MG capsule Take 1 capsule (20 mg total) by mouth every evening. 06/26/15  Yes Ernestina Penna, MD  predniSONE (DELTASONE) 10 MG tablet Take 5 mg by mouth daily.    Yes Historical Provider, MD  simvastatin (ZOCOR) 80 MG tablet Take 1 tablet (80 mg total) by mouth every evening. 06/26/15  Yes Ernestina Penna, MD  sulfamethoxazole-trimethoprim (BACTRIM DS,SEPTRA DS) 800-160 MG per tablet Take 1 tablet by mouth 3 (three) times a week.   Yes Historical Provider, MD  terazosin (HYTRIN) 10 MG capsule Take 1  capsule (10 mg total) by mouth at bedtime. 06/26/15  Yes Ernestina Penna, MD  levothyroxine (SYNTHROID, LEVOTHROID) 25 MCG tablet Take 1 tablet (25 mcg total) by mouth daily before breakfast. Patient not taking: Reported on 07/21/2015 06/26/15   Ernestina Penna, MD    Review of Systems:  Constitutional: Denies, diaphoresis, appetite change. HEENT: Denies photophobia, eye pain, redness, hearing loss, ear pain, congestion, sore throat, rhinorrhea, sneezing, mouth sores, trouble swallowing, neck pain, neck stiffness and tinnitus.   Respiratory: Denies  chest tightness,  and wheezing.   Cardiovascular: Denies chest pain, palpitations and leg swelling.  Gastrointestinal: Denies nausea, vomiting, abdominal pain, diarrhea, constipation, blood in stool and abdominal distention.  Genitourinary: Denies dysuria, urgency, frequency, hematuria, flank pain and difficulty urinating.  Endocrine: Denies: hot or cold intolerance, sweats, changes in hair or nails, polyuria, polydipsia. Musculoskeletal: Denies myalgias, back pain, joint swelling, arthralgias and gait problem.  Skin: Denies pallor, rash and wound.  Neurological: Denies dizziness, seizures, syncope, weakness, light-headedness, numbness and headaches.  Hematological: Denies adenopathy. Easy bruising, personal or family bleeding history  Psychiatric/Behavioral: Denies suicidal ideation, mood changes, confusion, nervousness, sleep disturbance and agitation    Physical Exam: Filed Vitals:   07/21/15 1323 07/21/15 1330 07/21/15 1400 07/21/15 1415  BP: 95/63 112/60 105/62   Pulse: 86 84 81 84  Temp:   98 F (36.7 C) 98 F (36.7 C)  TempSrc: Oral  Oral Oral  Resp: 25 28 27 24   Height:      Weight:      SpO2: 96% 94% 97% 98%      Constitutional: NAD, calm, comfortable Eyes: PERRL, lids and conjunctivae normal ENMT: Mucous membranes are moist. Posterior pharynx clear of any exudate or lesions.Normal dentition.  Neck: normal, supple, no masses,  no thyromegaly Respiratory:Bilateral crackles. Normal respiratory effort. No accessory muscle use.  Cardiovascular: Regular rate and rhythm, no murmurs / rubs / gallops. No extremity edema. 2+ pedal pulses. No carotid bruits.  Abdomen: no tenderness, no masses palpated. No hepatosplenomegaly. Bowel sounds positive.  Musculoskeletal: no clubbing / cyanosis. No joint deformity upper and lower extremities. Good ROM, no contractures. Normal muscle tone.  Skin: no rashes, lesions, ulcers. No induration Neurologic: CN 2-12 grossly intact. Sensation intact, DTR normal. Strength 5/5 in all 4.  Psychiatric: Normal judgment and insight. Alert and oriented x 3. Normal mood.    Labs on Admission: I have personally reviewed following labs and imaging studies  CBC:  Recent Labs Lab 07/21/15 0823  WBC 20.8*  NEUTROABS 18.4*  HGB 12.9*  HCT 39.8  MCV 92.1  PLT 235   Basic Metabolic Panel:  Recent Labs Lab 07/21/15 0823  NA 138  K 3.7  CL 95*  CO2 29  GLUCOSE 144*  BUN 16  CREATININE 0.66  CALCIUM 9.2   GFR: Estimated Creatinine Clearance: 62 mL/min (by C-G formula  based on Cr of 0.66). Liver Function Tests:  Recent Labs Lab 07/21/15 0823  AST 77*  ALT 132*  ALKPHOS 49  BILITOT 1.1  PROT 6.9  ALBUMIN 3.2*   No results for input(s): LIPASE, AMYLASE in the last 168 hours. No results for input(s): AMMONIA in the last 168 hours. Coagulation Profile: No results for input(s): INR, PROTIME in the last 168 hours. Cardiac Enzymes:  Recent Labs Lab 07/21/15 0823  TROPONINI <0.03   BNP (last 3 results) No results for input(s): PROBNP in the last 8760 hours. HbA1C: No results for input(s): HGBA1C in the last 72 hours. CBG: No results for input(s): GLUCAP in the last 168 hours. Lipid Profile: No results for input(s): CHOL, HDL, LDLCALC, TRIG, CHOLHDL, LDLDIRECT in the last 72 hours. Thyroid Function Tests: No results for input(s): TSH, T4TOTAL, FREET4, T3FREE, THYROIDAB in  the last 72 hours. Anemia Panel: No results for input(s): VITAMINB12, FOLATE, FERRITIN, TIBC, IRON, RETICCTPCT in the last 72 hours. Urine analysis:    Component Value Date/Time   COLORURINE AMBER* 07/21/2015 1123   APPEARANCEUR CLEAR 07/21/2015 1123   LABSPEC >1.030* 07/21/2015 1123   PHURINE 5.5 07/21/2015 1123   GLUCOSEU 100* 07/21/2015 1123   HGBUR MODERATE* 07/21/2015 1123   BILIRUBINUR SMALL* 07/21/2015 1123   BILIRUBINUR neg 12/19/2014 1006   KETONESUR TRACE* 07/21/2015 1123   PROTEINUR 30* 07/21/2015 1123   PROTEINUR neg 12/19/2014 1006   UROBILINOGEN negative 12/19/2014 1006   NITRITE NEGATIVE 07/21/2015 1123   NITRITE neg 12/19/2014 1006   LEUKOCYTESUR NEGATIVE 07/21/2015 1123   Sepsis Labs: @LABRCNTIP (procalcitonin:4,lacticidven:4) )No results found for this or any previous visit (from the past 240 hour(s)).   Radiological Exams on Admission: Dg Chest 2 View  07/21/2015  CLINICAL DATA:  Productive cough, congestion, shortness of breath for 3 days EXAM: CHEST  2 VIEW COMPARISON:  03/27/2012 FINDINGS: Cardiomediastinal silhouette is stable. There is chronic elevation of the right hemidiaphragm. Progression of reticular peripheral fibrotic changes. No definite superimposed infiltrate or pulmonary edema. Again noted perihilar bronchiectasis. Mild degenerative changes thoracic spine. IMPRESSION: Progression of fibrotic changes. Again noted perihilar bronchiectasis. No definite superimposed infiltrate or pulmonary edema. Electronically Signed   By: Natasha MeadLiviu  Pop M.D.   On: 07/21/2015 08:44   Ct Chest Wo Contrast  07/21/2015  CLINICAL DATA:  Short of breath for 4 days getting worse EXAM: CT CHEST WITHOUT CONTRAST TECHNIQUE: Multidetector CT imaging of the chest was performed following the standard protocol without IV contrast. COMPARISON:  07/21/2015, 03/27/2012, 01/10/2012 FINDINGS: Mediastinum/Nodes: Mild aortic arch calcification. No aortic dilatation. Sub carinal adenopathy  measuring 13 mm. No change in this appearance. No pericardial effusion. Antral posterior elongation of the upper trachea consistent with traction again identified. Lungs/Pleura: There are no pleural effusions. There is dense fibrotic change throughout the right upper lobe. In the medial right middle lobe there is also fibrotic change. There is relatively extensive consolidation and fibrosis with air bronchograms in the inferior half of the right lower lobe. There is milder peripheral fibrosis laterally in the left upper lobe. There is also more mild consolidation at the left base. Upper abdomen: No acute findings Musculoskeletal: No acute findings IMPRESSION: Progression of bilateral pulmonary parenchymal fibrosis right worse than left, when compared to prior CT scan 03/27/2012. Particularly dense consolidation is present in the right lower lobe, with air bronchograms present. The possibility of superimposed pneumonia is not excluded. Electronically Signed   By: Esperanza Heiraymond  Rubner M.D.   On: 07/21/2015 11:55    EKG:  Independently reviewed. Normal sinus rhythm with a rate of 82, left axis deviation, no acute ischemic abnormalities  Assessment/Plan Principal Problem:   Acute-on-chronic respiratory failure (HCC) Active Problems:   CAP (community acquired pneumonia)   Pulmonary fibrosis (HCC)   Hypothyroidism   BPH (benign prostatic hyperplasia)   Leukocytosis   Acute on chronic respiratory failure (HCC)    Acute on chronic respiratory failure -Largely due to his progressive pulmonary fibrosis although cannot exclude a component of pneumonia. -Agree with antibiotics to cover community-acquired pathogens. Will start on Rocephin and azithromycin -Check blood/sputum cultures. -Check strep pneumo/legionella urine antigens. -Check influenza PCR. -We'll start on IV Solu-Medrol 60 mg every 12 hours. -We'll request pulmonary consultation for further recommendations.  Progressive pulmonary fibrosis -Start  IV steroids, see above for details.  Community-acquired pneumonia, probable -Antibiotics and cultures requested. See above for details.  Hypothyroidism -Check TSH, continue home dose of Synthroid  Leukocytosis -Likely secondary to chronic steroid use plus minus infection  BPH -Continue Terazosin.   DVT prophylaxis: Lovenox  Code Status: Full code  Family Communication: Patient only  Disposition Plan: Discharge home once medically ready, anticipate 48-72 hours  Consults called: Pulmonary, Dr. Juanetta Gosling  Admission status: Inpatient    Time Spent: 85 minutes  Chaya Jan MD [facility] Pager [phone]  If 7PM-7AM, please contact night-coverage [email or web address] Password [facility]  07/21/2015, 4:43 PM

## 2015-07-21 NOTE — ED Notes (Signed)
Pt states he has been SOB for about four days. States it is getting worse

## 2015-07-21 NOTE — ED Notes (Signed)
Report given to Barbara CowerJason, RN at this time for room 341.

## 2015-07-21 NOTE — Progress Notes (Addendum)
Pt. Arrived to unit in stable condition.  Report received from Dyanne Ihaebecca Minter ED, RN.  Dr. Ardyth HarpsHernandez notified.

## 2015-07-21 NOTE — ED Notes (Signed)
Patient's o2 sat dropped to 87% Dr. Deretha EmoryZackowski increased nasal o2 to 4 lpm

## 2015-07-22 DIAGNOSIS — D72829 Elevated white blood cell count, unspecified: Secondary | ICD-10-CM

## 2015-07-22 DIAGNOSIS — E038 Other specified hypothyroidism: Secondary | ICD-10-CM

## 2015-07-22 LAB — CBC
HCT: 35.4 % — ABNORMAL LOW (ref 39.0–52.0)
Hemoglobin: 11.6 g/dL — ABNORMAL LOW (ref 13.0–17.0)
MCH: 30 pg (ref 26.0–34.0)
MCHC: 32.8 g/dL (ref 30.0–36.0)
MCV: 91.5 fL (ref 78.0–100.0)
PLATELETS: 246 10*3/uL (ref 150–400)
RBC: 3.87 MIL/uL — AB (ref 4.22–5.81)
RDW: 14.4 % (ref 11.5–15.5)
WBC: 17.5 10*3/uL — AB (ref 4.0–10.5)

## 2015-07-22 LAB — BASIC METABOLIC PANEL
Anion gap: 10 (ref 5–15)
BUN: 29 mg/dL — ABNORMAL HIGH (ref 6–20)
CALCIUM: 8.9 mg/dL (ref 8.9–10.3)
CO2: 30 mmol/L (ref 22–32)
CREATININE: 0.67 mg/dL (ref 0.61–1.24)
Chloride: 97 mmol/L — ABNORMAL LOW (ref 101–111)
Glucose, Bld: 189 mg/dL — ABNORMAL HIGH (ref 65–99)
Potassium: 4.2 mmol/L (ref 3.5–5.1)
Sodium: 137 mmol/L (ref 135–145)

## 2015-07-22 LAB — EXPECTORATED SPUTUM ASSESSMENT W GRAM STAIN, RFLX TO RESP C

## 2015-07-22 LAB — EXPECTORATED SPUTUM ASSESSMENT W REFEX TO RESP CULTURE

## 2015-07-22 LAB — HIV ANTIBODY (ROUTINE TESTING W REFLEX): HIV Screen 4th Generation wRfx: NONREACTIVE

## 2015-07-22 NOTE — Progress Notes (Signed)
Pharmacy Antibiotic Note  Terri SkainsWilliam H Rainwater is a 82 y.o. male admitted on 07/21/2015 with pneumonia.  Pharmacy has been consulted for renal dose antibiotics.  Plan: Patient started on azithromycin and ceftriaxone.  Neither require adjustment for renal function.  Pharmacy will sign off  Height: 5\' 6"  (167.6 cm) Weight: 167 lb (75.751 kg) IBW/kg (Calculated) : 63.8  Temp (24hrs), Avg:98 F (36.7 C), Min:97.7 F (36.5 C), Max:98.4 F (36.9 C)   Recent Labs Lab 07/21/15 0823 07/21/15 1659 07/22/15 0623  WBC 20.8* 17.3* 17.5*  CREATININE 0.66 0.96 0.67    Estimated Creatinine Clearance: 62 mL/min (by C-G formula based on Cr of 0.67).    Allergies  Allergen Reactions  . Codeine Other (See Comments)    hallucinations  . Levaquin [Levofloxacin In D5w] Other (See Comments)    Tendonitis    Thank you for allowing pharmacy to be a part of this patient's care.  Woodfin GanjaSeay, Kelvyn Schunk Poteet 07/22/2015 9:39 AM

## 2015-07-22 NOTE — Care Management Note (Signed)
Case Management Note  Patient Details  Name: Raymond [name] MRN: [id number] Date of Birth: 1929-08-25  Subjective/Objective:  Spoke with patient and spouse. Patient oriented from home an drives self. Spouse also drives. Not Home Bound. Has O2 portable and gas with [facility]  Uses a cane to ambulate.                Action/Plan:Home with self care.   Expected Discharge Date:                  Expected Discharge Plan:  Home/Self Care  In-House Referral:     Discharge planning Services  CM Consult  Post Acute Care Choice:    Choice offered to:     DME Arranged:    DME Agency:     HH Arranged:    HH Agency:     Status of Service:  In process, will continue to follow  Medicare Important Message Given:    Date Medicare IM Given:    Medicare IM give by:    Date Additional Medicare IM Given:    Additional Medicare Important Message give by:     If discussed at Long Length of Stay Meetings, dates discussed:    Additional Comments:  Adonis HugueninBerkhead, Rumaisa Schnetzer L, RN 07/22/2015, 2:06 PM

## 2015-07-22 NOTE — Progress Notes (Signed)
PROGRESS NOTE    Raymond [name]  ZOX:[id number] DOB: 07-29-1929 DOA: 07/21/2015 PCP: Rudi Heap, MD     Brief Narrative:  Patient is an 82 year old man initially admitted to the hospital on 4/24 with complaints of shortness of breath. He has a history of pulmonary fibrosis which has progressed as per CT scan. CT scan cannot exclude pneumonia and as such has been started on antibiotics. He is also on IV steroids for his pulmonary fibrosis.   Assessment & Plan:   Principal Problem:   Acute-on-chronic respiratory failure (HCC) Active Problems:   CAP (community acquired pneumonia)   Pulmonary fibrosis (HCC)   Hypothyroidism   BPH (benign prostatic hyperplasia)   Leukocytosis   Acute on chronic respiratory failure (HCC)   Acute on chronic respiratory failure -On account of his progressive, severe pulmonary fibrosis and possibly minimally acquired pneumonia -Typically uses 3-4 L of oxygen at home, continue oxygen supplementation as required.  Pulmonary fibrosis -For now would continue IV steroids. -He was taking 15 mg of prednisone daily at home. At time of discharge would do a very slow prednisone taper down to 15 mg a day.  Hypothyroidism -Continue Synthroid  Community-acquired pneumonia -Continue Rocephin/azithromycin. -Strep pneumo urinary antigen is negative, rest of culture data remains pending. -Flu PCR is negative.  Hyperlipidemia -Continue statin   DVT prophylaxis: Lovenox Code Status: Full code Family Communication: Wife at bedside updated on plan of care and all questions answered Disposition Plan: Home when ready, likely 24-48 hours  Consultants:   None  Procedures:   None  Antimicrobials:   Rocephin 4/24-  Azithromycin 4/24-    Subjective: Feels like shortness of breath is improved, would like to go home tomorrow, no chest pain  Objective: Filed Vitals:   07/21/15 1415 07/21/15 2201 07/22/15 0529 07/22/15 1428  BP:  110/60 112/69  138/62  Pulse: 84 69 66 80  Temp: 98 F (36.7 C) 98.4 F (36.9 C) 97.7 F (36.5 C) 97.7 F (36.5 C)  TempSrc: Oral Oral Oral Oral  Resp: Height:      Weight:      SpO2: 98% 98% 97% 100%    Intake/Output Summary (Last 24 hours) at 07/22/15 1523 Last data filed at 07/22/15 1429  Gross per 24 hour  Intake    720 ml  Output   1000 ml  Net   -280 ml   Filed Weights   07/21/15 0812 07/21/15 0815  Weight: 79.379 kg (175 lb) 75.751 kg (167 lb)    Examination:   General exam: Alert, awake, oriented x 3 Respiratory system: Bilateral crackles Cardiovascular system:RRR. No murmurs, rubs, gallops. Gastrointestinal system: Abdomen is nondistended, soft and nontender. No organomegaly or masses felt. Normal bowel sounds heard. Central nervous system: Alert and oriented. No focal neurological deficits. Extremities: No C/C/E, +pedal pulses Skin: No rashes, lesions or ulcers Psychiatry: Judgement and insight appear normal. Mood & affect appropriate.     Data Reviewed: I have personally reviewed following labs and imaging studies  CBC:  Recent Labs Lab 07/21/15 0823 07/21/15 1659 07/22/15 0623  WBC 20.8* 17.3* 17.5*  NEUTROABS 18.4*  --   --   HGB 12.9* 11.6* 11.6*  HCT 39.8 36.4* 35.4*  MCV 92.1 92.2 91.5  PLT 235 230 246   Basic Metabolic Panel:  Recent Labs Lab 07/21/15 0823 07/21/15 1659 07/22/15 0623  NA 138  --  137  K 3.7  --  4.2  CL 95*  --  97*  CO2 29  --  30  GLUCOSE 144*  --  189*  BUN 16  --  29*  CREATININE 0.66 0.96 0.67  CALCIUM 9.2  --  8.9   GFR: Estimated Creatinine Clearance: 62 mL/min (by C-G formula based on Cr of 0.67). Liver Function Tests:  Recent Labs Lab 07/21/15 0823  AST 77*  ALT 132*  ALKPHOS 49  BILITOT 1.1  PROT 6.9  ALBUMIN 3.2*   No results for input(s): LIPASE, AMYLASE in the last 168 hours. No results for input(s): AMMONIA in the last 168 hours. Coagulation Profile: No results for input(s): INR,  PROTIME in the last 168 hours. Cardiac Enzymes:  Recent Labs Lab 07/21/15 0823  TROPONINI <0.03   BNP (last 3 results) No results for input(s): PROBNP in the last 8760 hours. HbA1C: No results for input(s): HGBA1C in the last 72 hours. CBG: No results for input(s): GLUCAP in the last 168 hours. Lipid Profile: No results for input(s): CHOL, HDL, LDLCALC, TRIG, CHOLHDL, LDLDIRECT in the last 72 hours. Thyroid Function Tests:  Recent Labs  07/21/15 1659  TSH 1.588   Anemia Panel: No results for input(s): VITAMINB12, FOLATE, FERRITIN, TIBC, IRON, RETICCTPCT in the last 72 hours. Urine analysis:    Component Value Date/Time   COLORURINE AMBER* 07/21/2015 1123   APPEARANCEUR CLEAR 07/21/2015 1123   LABSPEC >1.030* 07/21/2015 1123   PHURINE 5.5 07/21/2015 1123   GLUCOSEU 100* 07/21/2015 1123   HGBUR MODERATE* 07/21/2015 1123   BILIRUBINUR SMALL* 07/21/2015 1123   BILIRUBINUR neg 12/19/2014 1006   KETONESUR TRACE* 07/21/2015 1123   PROTEINUR 30* 07/21/2015 1123   PROTEINUR neg 12/19/2014 1006   UROBILINOGEN negative 12/19/2014 1006   NITRITE NEGATIVE 07/21/2015 1123   NITRITE neg 12/19/2014 1006   LEUKOCYTESUR NEGATIVE 07/21/2015 1123   Sepsis Labs: (procalcitonin:4,lacticidven:4)  ) Recent Results (from the past 240 hour(s))  Culture, blood (routine x 2) Call MD if unable to obtain prior to antibiotics being given     Status: None (Preliminary result)   Collection Time: 07/21/15  4:59 PM  Result Value Ref Range Status   Specimen Description BLOOD LEFT ARM  Final   Special Requests BOTTLES DRAWN AEROBIC AND ANAEROBIC 7CC  Final   Culture NO GROWTH < 24 HOURS  Final   Report Status PENDING  Incomplete  Culture, blood (routine x 2) Call MD if unable to obtain prior to antibiotics being given     Status: None (Preliminary result)   Collection Time: 07/21/15  5:04 PM  Result Value Ref Range Status   Specimen Description BLOOD LEFT ARM  Final   Special  Requests BOTTLES DRAWN AEROBIC AND ANAEROBIC 6CC  Final   Culture NO GROWTH < 24 HOURS  Final   Report Status PENDING  Incomplete  Culture, sputum-assessment     Status: None   Collection Time: 07/21/15  5:30 PM  Result Value Ref Range Status   Specimen Description SPUTUM EXPECTORATED  Final   Special Requests NONE  Final   Sputum evaluation   Final    THIS SPECIMEN IS ACCEPTABLE. RESPIRATORY CULTURE REPORT TO FOLLOW. Performed at [facility]    Report Status 07/22/2015 FINAL  Final         Radiology Studies: Dg Chest 2 View  07/21/2015  CLINICAL DATA:  Productive cough, congestion, shortness of breath for 3 days EXAM: CHEST  2 VIEW COMPARISON:  03/27/2012 FINDINGS: Cardiomediastinal silhouette is stable. There is chronic elevation of the right hemidiaphragm. Progression of reticular  peripheral fibrotic changes. No definite superimposed infiltrate or pulmonary edema. Again noted perihilar bronchiectasis. Mild degenerative changes thoracic spine. IMPRESSION: Progression of fibrotic changes. Again noted perihilar bronchiectasis. No definite superimposed infiltrate or pulmonary edema. Electronically Signed   By: Natasha MeadLiviu  Pop M.D.   On: 07/21/2015 08:44   Ct Chest Wo Contrast  07/21/2015  CLINICAL DATA:  Short of breath for 4 days getting worse EXAM: CT CHEST WITHOUT CONTRAST TECHNIQUE: Multidetector CT imaging of the chest was performed following the standard protocol without IV contrast. COMPARISON:  07/21/2015, 03/27/2012, 01/10/2012 FINDINGS: Mediastinum/Nodes: Mild aortic arch calcification. No aortic dilatation. Sub carinal adenopathy measuring 13 mm. No change in this appearance. No pericardial effusion. Antral posterior elongation of the upper trachea consistent with traction again identified. Lungs/Pleura: There are no pleural effusions. There is dense fibrotic change throughout the right upper lobe. In the medial right middle lobe there is also fibrotic change. There is relatively  extensive consolidation and fibrosis with air bronchograms in the inferior half of the right lower lobe. There is milder peripheral fibrosis laterally in the left upper lobe. There is also more mild consolidation at the left base. Upper abdomen: No acute findings Musculoskeletal: No acute findings IMPRESSION: Progression of bilateral pulmonary parenchymal fibrosis right worse than left, when compared to prior CT scan 03/27/2012. Particularly dense consolidation is present in the right lower lobe, with air bronchograms present. The possibility of superimposed pneumonia is not excluded. Electronically Signed   By: Esperanza Heiraymond  Rubner M.D.   On: 07/21/2015 11:55        Scheduled Meds: . aspirin EC  325 mg Oral Daily  . atorvastatin  40 mg Oral q1800  . azithromycin  500 mg Intravenous Q24H  . cefTRIAXone (ROCEPHIN)  IV  1 g Intravenous Q24H  . cholecalciferol  2,000 Units Oral Once per day on Mon Tue Wed Thu  . [START ON 07/25/2015] cholecalciferol  4,000 Units Oral Once per day on Sun Fri Sat  . citalopram  20 mg Oral Daily  . enoxaparin (LOVENOX) injection  40 mg Subcutaneous Q24H  . levothyroxine  125 mcg Oral QAC breakfast  . methylPREDNISolone (SOLU-MEDROL) injection  60 mg Intravenous Q12H  . mycophenolate  1,000 mg Oral BID  . pantoprazole  40 mg Oral Daily  . sodium chloride flush  3 mL Intravenous Q12H  . terazosin  10 mg Oral QHS   Continuous Infusions:    LOS: 1 day    Time spent: 25 minutes. Greater than 50% of this time was spent in direct contact with the patient coordinating care.     Chaya JanHERNANDEZ ACOSTA,ESTELA, MD [facility] Pager [phone]  If 7PM-7AM, please contact night-coverage [email or web address] Password [email or web address] 07/22/2015, 3:23 PM

## 2015-07-23 DIAGNOSIS — R7989 Other specified abnormal findings of blood chemistry: Secondary | ICD-10-CM

## 2015-07-23 DIAGNOSIS — T50905A Adverse effect of unspecified drugs, medicaments and biological substances, initial encounter: Secondary | ICD-10-CM

## 2015-07-23 DIAGNOSIS — R945 Abnormal results of liver function studies: Secondary | ICD-10-CM

## 2015-07-23 DIAGNOSIS — J189 Pneumonia, unspecified organism: Secondary | ICD-10-CM

## 2015-07-23 DIAGNOSIS — J9621 Acute and chronic respiratory failure with hypoxia: Secondary | ICD-10-CM

## 2015-07-23 DIAGNOSIS — J8410 Pulmonary fibrosis, unspecified: Secondary | ICD-10-CM

## 2015-07-23 DIAGNOSIS — R739 Hyperglycemia, unspecified: Secondary | ICD-10-CM | POA: Diagnosis present

## 2015-07-23 LAB — CBC
HEMATOCRIT: 34.3 % — AB (ref 39.0–52.0)
Hemoglobin: 11.3 g/dL — ABNORMAL LOW (ref 13.0–17.0)
MCH: 30.5 pg (ref 26.0–34.0)
MCHC: 32.9 g/dL (ref 30.0–36.0)
MCV: 92.5 fL (ref 78.0–100.0)
Platelets: 275 10*3/uL (ref 150–400)
RBC: 3.71 MIL/uL — AB (ref 4.22–5.81)
RDW: 14.3 % (ref 11.5–15.5)
WBC: 17.5 10*3/uL — AB (ref 4.0–10.5)

## 2015-07-23 LAB — LEGIONELLA PNEUMOPHILA SEROGP 1 UR AG: L. pneumophila Serogp 1 Ur Ag: NEGATIVE

## 2015-07-23 LAB — GLUCOSE, CAPILLARY: Glucose-Capillary: 295 mg/dL — ABNORMAL HIGH (ref 65–99)

## 2015-07-23 MED ORDER — INSULIN ASPART 100 UNIT/ML ~~LOC~~ SOLN
0.0000 [IU] | Freq: Three times a day (TID) | SUBCUTANEOUS | Status: DC
  Administered 2015-07-24: 8 [IU] via SUBCUTANEOUS
  Administered 2015-07-24: 3 [IU] via SUBCUTANEOUS

## 2015-07-23 MED ORDER — INSULIN ASPART 100 UNIT/ML ~~LOC~~ SOLN
0.0000 [IU] | Freq: Every day | SUBCUTANEOUS | Status: DC
  Administered 2015-07-23: 2 [IU] via SUBCUTANEOUS

## 2015-07-23 MED ORDER — INSULIN GLARGINE 100 UNIT/ML ~~LOC~~ SOLN
10.0000 [IU] | Freq: Every day | SUBCUTANEOUS | Status: DC
  Administered 2015-07-23: 10 [IU] via SUBCUTANEOUS
  Filled 2015-07-23 (×2): qty 0.1

## 2015-07-23 NOTE — Care Management Important Message (Signed)
Important Message  Patient Details  Name: Raymond [name] MRN: [id number] Date of Birth: June 12, 1929   Medicare Important Message Given:  Yes    Adonis HugueninBerkhead, Nik Gorrell L, RN 07/23/2015, 1:48 PM

## 2015-07-23 NOTE — Progress Notes (Deleted)
Pt escorted to Main entrance by NT [facility]

## 2015-07-23 NOTE — Progress Notes (Signed)
PROGRESS NOTE    Raymond [name]  ZOX:[id number] DOB: 04/16/1929 DOA: 07/21/2015 PCP: Rudi Heap, MD     Brief Narrative:  Patient is an 82 year old man initially admitted to the hospital on 4/24 with complaints of shortness of breath. He has a history of pulmonary fibrosis which has progressed as per CT scan. CT scan cannot exclude pneumonia and as such has been started on antibiotics. He is also on IV steroids for his pulmonary fibrosis.   Assessment & Plan:   Principal Problem:   Acute-on-chronic respiratory failure (HCC) Active Problems:   Hypothyroidism   BPH (benign prostatic hyperplasia)   CAP (community acquired pneumonia)   Pulmonary fibrosis (HCC)   Leukocytosis   Acute on chronic respiratory failure (HCC)   Acute on chronic respiratory failure with hypoxia. -Etiology secondary to his progressive, severe pulmonary fibrosis and possibly minimally acquired pneumonia -Typically uses 3-4 L of oxygen at home, continue oxygen supplementation as required.  Pulmonary fibrosis -For now would continue IV steroids. He is chronically treated with 15 mg of prednisone daily. He is also on another medication chronically for his pulmonary fibrosis.? Name. - At time of discharge would do a very slow prednisone taper down to 15 mg a day. -He is followed by pulmonologist Dr. Hessie Dibble at [facility]. Patient has an appointment with him next week.   Hypothyroidism -Continue Synthroid -We'll check a TSH.  Community-acquired pneumonia -Continue Rocephin/azithromycin. -Strep pneumo urinary antigen was negative, rest of culture data remains pending. -Flu PCR is negative. -His white blood cell count is still elevated, likely from steroids, but he is afebrile.  Hyperlipidemia -Continue statin  Steroid-induced hyperglycemia. -We will add sliding scale NovoLog.  Elevated LFTs; (noted on admission laboratory results.) We'll recheck his hepatic function panel. Etiology of  elevation may be secondary to statin. -Consider discontinuing or decreasing the dose of statin following the follow-up LFTs.  DVT prophylaxis: Lovenox Code Status: Full code Family Communication: Discussed with patient; family not available Disposition Plan: Discharge to home when clinically appropriate  Consultants:   None  Procedures:   None  Antimicrobials:   Rocephin 4/24-  Azithromycin 4/24-    Subjective: Feels like shortness of breath is improved, would like to go home tomorrow, no chest pain  Objective: Filed Vitals:   07/22/15 1428 07/22/15 2009 07/22/15 2144 07/23/15 1310  BP: 138/62  100/46 132/69  Pulse: 80  58 85  Temp: 97.7 F (36.5 C)  97.8 F (36.6 C) 98 F (36.7 C)  TempSrc: Oral  Oral Oral  Resp: Height:      Weight:      SpO2: 100% 98% 100% 99%    Intake/Output Summary (Last 24 hours) at 07/23/15 1921 Last data filed at 07/23/15 1846  Gross per 24 hour  Intake    480 ml  Output    300 ml  Net    180 ml   Filed Weights   07/21/15 0812 07/21/15 0815  Weight: 79.379 kg (175 lb) 75.751 kg (167 lb)    Examination:   General exam: Pleasant 82 year old man in no acute distress. Respiratory system: Bilateral crackles Cardiovascular system:RRR. No murmurs, rubs, gallops. Gastrointestinal system: Abdomen is nondistended, soft and nontender. No organomegaly or masses felt. Normal bowel sounds heard. Central nervous system: Alert and oriented. No focal neurological deficits. Extremities: No pedal edema. Skin: No rashes, lesions or ulcers Psychiatry: Judgement and insight appear normal. Mood & affect appropriate.     Data Reviewed: I have  personally reviewed following labs and imaging studies  CBC:  Recent Labs Lab 07/21/15 0823 07/21/15 1659 07/22/15 0623 07/23/15 0500  WBC 20.8* 17.3* 17.5* 17.5*  NEUTROABS 18.4*  --   --   --   HGB 12.9* 11.6* 11.6* 11.3*  HCT 39.8 36.4* 35.4* 34.3*  MCV 92.1 92.2 91.5 92.5  PLT  235 230 246 275   Basic Metabolic Panel:  Recent Labs Lab 07/21/15 0823 07/21/15 1659 07/22/15 0623  NA 138  --  137  K 3.7  --  4.2  CL 95*  --  97*  CO2 29  --  30  GLUCOSE 144*  --  189*  BUN 16  --  29*  CREATININE 0.66 0.96 0.67  CALCIUM 9.2  --  8.9   GFR: Estimated Creatinine Clearance: 62 mL/min (by C-G formula based on Cr of 0.67). Liver Function Tests:  Recent Labs Lab 07/21/15 0823  AST 77*  ALT 132*  ALKPHOS 49  BILITOT 1.1  PROT 6.9  ALBUMIN 3.2*   No results for input(s): LIPASE, AMYLASE in the last 168 hours. No results for input(s): AMMONIA in the last 168 hours. Coagulation Profile: No results for input(s): INR, PROTIME in the last 168 hours. Cardiac Enzymes:  Recent Labs Lab 07/21/15 0823  TROPONINI <0.03   BNP (last 3 results) No results for input(s): PROBNP in the last 8760 hours. HbA1C: No results for input(s): HGBA1C in the last 72 hours. CBG: No results for input(s): GLUCAP in the last 168 hours. Lipid Profile: No results for input(s): CHOL, HDL, LDLCALC, TRIG, CHOLHDL, LDLDIRECT in the last 72 hours. Thyroid Function Tests:  Recent Labs  07/21/15 1659  TSH 1.588   Anemia Panel: No results for input(s): VITAMINB12, FOLATE, FERRITIN, TIBC, IRON, RETICCTPCT in the last 72 hours. Urine analysis:    Component Value Date/Time   COLORURINE AMBER* 07/21/2015 1123   APPEARANCEUR CLEAR 07/21/2015 1123   LABSPEC >1.030* 07/21/2015 1123   PHURINE 5.5 07/21/2015 1123   GLUCOSEU 100* 07/21/2015 1123   HGBUR MODERATE* 07/21/2015 1123   BILIRUBINUR SMALL* 07/21/2015 1123   BILIRUBINUR neg 12/19/2014 1006   KETONESUR TRACE* 07/21/2015 1123   PROTEINUR 30* 07/21/2015 1123   PROTEINUR neg 12/19/2014 1006   UROBILINOGEN negative 12/19/2014 1006   NITRITE NEGATIVE 07/21/2015 1123   NITRITE neg 12/19/2014 1006   LEUKOCYTESUR NEGATIVE 07/21/2015 1123   Sepsis Labs: (procalcitonin:4,lacticidven:4)  ) Recent Results (from the  past 240 hour(s))  Culture, blood (routine x 2) Call MD if unable to obtain prior to antibiotics being given     Status: None (Preliminary result)   Collection Time: 07/21/15  4:59 PM  Result Value Ref Range Status   Specimen Description BLOOD LEFT ARM  Final   Special Requests BOTTLES DRAWN AEROBIC AND ANAEROBIC 7CC  Final   Culture NO GROWTH 2 DAYS  Final   Report Status PENDING  Incomplete  Culture, blood (routine x 2) Call MD if unable to obtain prior to antibiotics being given     Status: None (Preliminary result)   Collection Time: 07/21/15  5:04 PM  Result Value Ref Range Status   Specimen Description BLOOD LEFT ARM  Final   Special Requests BOTTLES DRAWN AEROBIC AND ANAEROBIC 6CC  Final   Culture NO GROWTH 2 DAYS  Final   Report Status PENDING  Incomplete  Culture, sputum-assessment     Status: None   Collection Time: 07/21/15  5:30 PM  Result Value Ref Range Status   Specimen  Description SPUTUM EXPECTORATED  Final   Special Requests NONE  Final   Sputum evaluation   Final    THIS SPECIMEN IS ACCEPTABLE. RESPIRATORY CULTURE REPORT TO FOLLOW. [facility]    Report Status 07/22/2015 FINAL  Final         Radiology Studies: No results found.      Scheduled Meds: . aspirin EC  325 mg Oral Daily  . atorvastatin  40 mg Oral q1800  . azithromycin  500 mg Intravenous Q24H  . cefTRIAXone (ROCEPHIN)  IV  1 g Intravenous Q24H  . cholecalciferol  2,000 Units Oral Once per day on Mon Tue Wed Thu  . [START ON 07/25/2015] cholecalciferol  4,000 Units Oral Once per day on Sun Fri Sat  . citalopram  20 mg Oral Daily  . enoxaparin (LOVENOX) injection  40 mg Subcutaneous Q24H  . levothyroxine  125 mcg Oral QAC breakfast  . methylPREDNISolone (SOLU-MEDROL) injection  60 mg Intravenous Q12H  . mycophenolate  1,000 mg Oral BID  . pantoprazole  40 mg Oral Daily  . sodium chloride flush  3 mL Intravenous Q12H  . terazosin  10 mg Oral QHS   Continuous  Infusions:    LOS: 2 days         Elliot CousinFISHER,Sapphire Tygart, MD [facility] Pager [phone]  If 7PM-7AM, please contact night-coverage [email or web address] Password [email or web address] 07/23/2015, 7:21 PM

## 2015-07-23 NOTE — Clinical Documentation Improvement (Addendum)
Hospitalist  Please document query responses in the progress notes and discharge summary, not on the CDI BPA form.   Thank you  Query 1 of 2        (please scroll down) "Acute on chronic respiratory failure" is documented in the H&P.  Please document the TYPE of acute on chronic respiratory failure:  - Hypoxic  - Hypercapnic  - Combined hypoxic and hypercapnic  - Other type  - Unable to clinically determine  Query 2 of 2  COPD (chronic obstructive pulmonary disease" is documented in the past medical history section of the H&P.  "COPD" is also documented in the ED provider's note.  Please document the status of the patient's COPD this admission:  - COPD with acute exacerbation  - COPD, stable this admission without exacerbation  - Other status or type  - Unable to clinically determine   Please exercise your independent, professional judgment when responding. A specific answer is not anticipated or expected.   Thank You, Jerral Ralphathy R Erum Cercone  RN BSN CCDS [phone] [facility] Information Management [place]

## 2015-07-24 DIAGNOSIS — J441 Chronic obstructive pulmonary disease with (acute) exacerbation: Secondary | ICD-10-CM | POA: Diagnosis present

## 2015-07-24 LAB — GLUCOSE, CAPILLARY
GLUCOSE-CAPILLARY: 189 mg/dL — AB (ref 65–99)
Glucose-Capillary: 265 mg/dL — ABNORMAL HIGH (ref 65–99)
Glucose-Capillary: 218 mg/dL — ABNORMAL HIGH (ref 65–99)
Glucose-Capillary: 274 mg/dL — ABNORMAL HIGH (ref 65–99)

## 2015-07-24 LAB — CBC
HCT: 40.5 % (ref 39.0–52.0)
HEMOGLOBIN: 12.5 g/dL — AB (ref 13.0–17.0)
MCH: 28.7 pg (ref 26.0–34.0)
MCHC: 30.9 g/dL (ref 30.0–36.0)
MCV: 92.9 fL (ref 78.0–100.0)
PLATELETS: 285 10*3/uL (ref 150–400)
RBC: 4.36 MIL/uL (ref 4.22–5.81)
RDW: 14.1 % (ref 11.5–15.5)
WBC: 13 10*3/uL — ABNORMAL HIGH (ref 4.0–10.5)

## 2015-07-24 LAB — BASIC METABOLIC PANEL
ANION GAP: 12 (ref 5–15)
BUN: 27 mg/dL — AB (ref 6–20)
CHLORIDE: 98 mmol/L — AB (ref 101–111)
CO2: 31 mmol/L (ref 22–32)
Calcium: 9.2 mg/dL (ref 8.9–10.3)
Creatinine, Ser: 0.76 mg/dL (ref 0.61–1.24)
GFR calc Af Amer: >60 mL/min (ref >60)
GFR calc non Af Amer: >60 mL/min (ref >60)
GLUCOSE: 189 mg/dL — AB (ref 65–99)
POTASSIUM: 4.4 mmol/L (ref 3.5–5.1)
Sodium: 141 mmol/L (ref 135–145)

## 2015-07-24 LAB — HEPATIC FUNCTION PANEL
ALK PHOS: 53 U/L (ref 38–126)
ALT: 104 U/L — AB (ref 17–63)
AST: 42 U/L — AB (ref 15–41)
Albumin: 2.8 g/dL — ABNORMAL LOW (ref 3.5–5.0)
BILIRUBIN DIRECT: 0.1 mg/dL (ref 0.1–0.5)
BILIRUBIN INDIRECT: 0.4 mg/dL (ref 0.3–0.9)
BILIRUBIN TOTAL: 0.5 mg/dL (ref 0.3–1.2)
Total Protein: 6.3 g/dL — ABNORMAL LOW (ref 6.5–8.1)

## 2015-07-24 LAB — T4, FREE: FREE T4: 1.07 ng/dL (ref 0.61–1.12)

## 2015-07-24 LAB — TSH: TSH: 2.442 u[IU]/mL (ref 0.350–4.500)

## 2015-07-24 MED ORDER — INSULIN ASPART 100 UNIT/ML ~~LOC~~ SOLN
0.0000 [IU] | Freq: Three times a day (TID) | SUBCUTANEOUS | Status: DC
  Administered 2015-07-25: 4 [IU] via SUBCUTANEOUS

## 2015-07-24 MED ORDER — INSULIN GLARGINE 100 UNIT/ML ~~LOC~~ SOLN
15.0000 [IU] | Freq: Every day | SUBCUTANEOUS | Status: DC
  Administered 2015-07-24: 15 [IU] via SUBCUTANEOUS
  Filled 2015-07-24 (×4): qty 0.15

## 2015-07-24 MED ORDER — IPRATROPIUM-ALBUTEROL 0.5-2.5 (3) MG/3ML IN SOLN
3.0000 mL | Freq: Four times a day (QID) | RESPIRATORY_TRACT | Status: DC
  Administered 2015-07-24 – 2015-07-25 (×3): 3 mL via RESPIRATORY_TRACT
  Filled 2015-07-24 (×3): qty 3

## 2015-07-24 MED ORDER — INSULIN ASPART 100 UNIT/ML ~~LOC~~ SOLN
0.0000 [IU] | Freq: Every day | SUBCUTANEOUS | Status: DC
  Administered 2015-07-24: 2 [IU] via SUBCUTANEOUS

## 2015-07-24 NOTE — Progress Notes (Signed)
Out of bed to chair today chair today. No c/o pain or discomfort noted.

## 2015-07-24 NOTE — Progress Notes (Signed)
PROGRESS NOTE    Raymond [name]  ZOX:[id number] DOB: 1930/02/26 DOA: 07/21/2015 PCP: Rudi Heap, MD     Brief Narrative:  Patient is an 82 year old man initially admitted to the hospital on 4/24 with complaints of shortness of breath. He has a history of pulmonary fibrosis which has progressed as per CT scan. CT scan cannot exclude pneumonia and as such has been started on antibiotics. He is also on IV steroids for his pulmonary fibrosis and possible COPD with exacerbation.   Assessment & Plan:   Principal Problem:   Acute on chronic respiratory failure with hypoxia (HCC) Active Problems:   Hypothyroidism   BPH (benign prostatic hyperplasia)   CAP (community acquired pneumonia)   Pulmonary fibrosis (HCC)   Leukocytosis   Hyperglycemia, drug-induced   Elevated LFTs   Acute on chronic respiratory failure with hypoxia. -Etiology secondary to his progressive, severe pulmonary fibrosis and possibly community acquired pneumonia -Typically uses 3-4 L of oxygen at home, continue oxygen supplementation as required.  Pulmonary fibrosis -For now would continue IV steroids. He is chronically treated with 15 mg of prednisone daily. He is also on another medication chronically for his pulmonary fibrosis.? Mycophenolate; it was continued. - At time of discharge would do a very slow prednisone taper down to 15 mg a day. -He is followed by pulmonologist Dr. Hessie Dibble at [facility]. Patient has an appointment with him next week.  COPD with exacerbation. Patient has started to have slightly more wheezing, which could be indicative of his airways opening up. Patient will be started on duo nebs every 6 hours.  Community-acquired pneumonia -Continue Rocephin/azithromycin. -Strep pneumo urinary antigen was negative, rest of culture data remains pending. -Flu PCR is negative. -His white blood cell count is still elevated, but trending down. In part, steroid-induced leukocytosis, but he is  afebrile  Hyperlipidemia -Continue statin, cautiously with elevated LFTs.  Steroid-induced hyperglycemia. -Sliding-scale NovoLog and Lantus were started. Will titrate accordingly.  Hypothyroidism. Patient was continued on Synthroid. His TSH and free T4 within normal limits.  Elevated LFTs; (noted on admission laboratory results.) LFTs rechecked with some trend downward of the levels. Etiology of elevation may be secondary to statin therapy. -We'll hold Lipitor temporarily to see if it affects his LFTs.  DVT prophylaxis: Lovenox Code Status: Full code Family Communication: Discussed with his wife. Disposition Plan: Discharge to home when clinically appropriate, likely in the next 24-48 hours.  Consultants:   None  Procedures:   None  Antimicrobials:   Rocephin 4/24-  Azithromycin 4/24-    Subjective: Patient feels as if he is breathing better for the first time. He still has a mild productive cough with yellow sputum.  Objective: Filed Vitals:   07/22/15 2144 07/23/15 1310 07/23/15 2042 07/24/15 0551  BP: 100/46 132/69 128/74 113/72  Pulse: 58 85 88 88  Temp: 97.8 F (36.6 C) 98 F (36.7 C) 98.4 F (36.9 C) 97.7 F (36.5 C)  TempSrc: Oral Oral Oral Oral  Resp: Height:      Weight:      SpO2: 100% 99% 100% 100%    Intake/Output Summary (Last 24 hours) at 07/24/15 1045 Last data filed at 07/24/15 0600  Gross per 24 hour  Intake    960 ml  Output    650 ml  Net    310 ml   Filed Weights   07/21/15 0812 07/21/15 0815  Weight: 79.379 kg (175 lb) 75.751 kg (167 lb)    Examination:  General exam: Pleasant 82 year old man in no acute distress. Respiratory system: Fine bibasilar crackles with occasional/scattered expiratory wheezes; better air movement. Breathing nonlabored at rest. Cardiovascular system:RRR. No murmurs, rubs, gallops. Gastrointestinal system: Abdomen is nondistended, soft and nontender. No organomegaly or masses felt.  Normal bowel sounds heard. Central nervous system: Alert and oriented. No focal neurological deficits. Extremities: No pedal edema. Skin: No rashes, lesions or ulcers Psychiatry: Judgement and insight appear normal. Mood & affect appropriate.     Data Reviewed: I have personally reviewed following labs and imaging studies  CBC:  Recent Labs Lab 07/21/15 0823 07/21/15 1659 07/22/15 0623 07/23/15 0500 07/24/15 0551  WBC 20.8* 17.3* 17.5* 17.5* 13.0*  NEUTROABS 18.4*  --   --   --   --   HGB 12.9* 11.6* 11.6* 11.3* 12.5*  HCT 39.8 36.4* 35.4* 34.3* 40.5  MCV 92.1 92.2 91.5 92.5 92.9  PLT 235 230 246 275 285   Basic Metabolic Panel:  Recent Labs Lab 07/21/15 0823 07/21/15 1659 07/22/15 0623 07/24/15 0551  NA 138  --  137 141  K 3.7  --  4.2 4.4  CL 95*  --  97* 98*  CO2 29  --  30 31  GLUCOSE 144*  --  189* 189*  BUN 16  --  29* 27*  CREATININE 0.66 0.96 0.67 0.76  CALCIUM 9.2  --  8.9 9.2   GFR: Estimated Creatinine Clearance: 62 mL/min (by C-G formula based on Cr of 0.76). Liver Function Tests:  Recent Labs Lab 07/21/15 0823 07/24/15 0551  AST 77* 42*  ALT 132* 104*  ALKPHOS 49 53  BILITOT 1.1 0.5  PROT 6.9 6.3*  ALBUMIN 3.2* 2.8*   No results for input(s): LIPASE, AMYLASE in the last 168 hours. No results for input(s): AMMONIA in the last 168 hours. Coagulation Profile: No results for input(s): INR, PROTIME in the last 168 hours. Cardiac Enzymes:  Recent Labs Lab 07/21/15 0823  TROPONINI <0.03   BNP (last 3 results) No results for input(s): PROBNP in the last 8760 hours. HbA1C: No results for input(s): HGBA1C in the last 72 hours. CBG:  Recent Labs Lab 07/23/15 2040 07/24/15 0755  GLUCAP 295* 189*   Lipid Profile: No results for input(s): CHOL, HDL, LDLCALC, TRIG, CHOLHDL, LDLDIRECT in the last 72 hours. Thyroid Function Tests:  Recent Labs  07/24/15 0551  TSH 2.442   Anemia Panel: No results for input(s): VITAMINB12, FOLATE,  FERRITIN, TIBC, IRON, RETICCTPCT in the last 72 hours. Urine analysis:    Component Value Date/Time   COLORURINE AMBER* 07/21/2015 1123   APPEARANCEUR CLEAR 07/21/2015 1123   LABSPEC >1.030* 07/21/2015 1123   PHURINE 5.5 07/21/2015 1123   GLUCOSEU 100* 07/21/2015 1123   HGBUR MODERATE* 07/21/2015 1123   BILIRUBINUR SMALL* 07/21/2015 1123   BILIRUBINUR neg 12/19/2014 1006   KETONESUR TRACE* 07/21/2015 1123   PROTEINUR 30* 07/21/2015 1123   PROTEINUR neg 12/19/2014 1006   UROBILINOGEN negative 12/19/2014 1006   NITRITE NEGATIVE 07/21/2015 1123   NITRITE neg 12/19/2014 1006   LEUKOCYTESUR NEGATIVE 07/21/2015 1123   Sepsis Labs: @LABRCNTIP (procalcitonin:4,lacticidven:4)  ) Recent Results (from the past 240 hour(s))  Culture, blood (routine x 2) Call MD if unable to obtain prior to antibiotics being given     Status: None (Preliminary result)   Collection Time: 07/21/15  4:59 PM  Result Value Ref Range Status   Specimen Description BLOOD LEFT ARM  Final   Special Requests BOTTLES DRAWN AEROBIC AND ANAEROBIC 7CC  Final  Culture NO GROWTH 3 DAYS  Final   Report Status PENDING  Incomplete  Culture, blood (routine x 2) Call MD if unable to obtain prior to antibiotics being given     Status: None (Preliminary result)   Collection Time: 07/21/15  5:04 PM  Result Value Ref Range Status   Specimen Description BLOOD LEFT ARM  Final   Special Requests BOTTLES DRAWN AEROBIC AND ANAEROBIC 6CC  Final   Culture NO GROWTH 3 DAYS  Final   Report Status PENDING  Incomplete  Culture, sputum-assessment     Status: None   Collection Time: 07/21/15  5:30 PM  Result Value Ref Range Status   Specimen Description SPUTUM EXPECTORATED  Final   Special Requests NONE  Final   Sputum evaluation   Final    THIS SPECIMEN IS ACCEPTABLE. RESPIRATORY CULTURE REPORT TO FOLLOW. Performed at [facility]    Report Status 07/22/2015 FINAL  Final  Culture, respiratory (NON-Expectorated)     Status: None  (Preliminary result)   Collection Time: 07/21/15  5:30 PM  Result Value Ref Range Status   Specimen Description SPUTUM EXPECTORATED  Final   Special Requests NONE  Final   Gram Stain   Final    ABUNDANT WBC PRESENT,BOTH PMN AND MONONUCLEAR FEW SQUAMOUS EPITHELIAL CELLS PRESENT ABUNDANT GRAM POSITIVE COCCI IN PAIRS IN CHAINS ABUNDANT GRAM NEGATIVE RODS FEW GRAM POSITIVE RODS FEW YEAST THIS SPECIMEN IS ACCEPTABLE FOR SPUTUM CULTURE Performed at [facility]    Culture   Final    Culture reincubated for better growth Performed at [facility]    Report Status PENDING  Incomplete         Radiology Studies: No results found.      Scheduled Meds: . aspirin EC  325 mg Oral Daily  . atorvastatin  40 mg Oral q1800  . azithromycin  500 mg Intravenous Q24H  . cefTRIAXone (ROCEPHIN)  IV  1 g Intravenous Q24H  . cholecalciferol  2,000 Units Oral Once per day on Mon Tue Wed Thu  . [START ON 07/25/2015] cholecalciferol  4,000 Units Oral Once per day on Sun Fri Sat  . citalopram  20 mg Oral Daily  . enoxaparin (LOVENOX) injection  40 mg Subcutaneous Q24H  . insulin aspart  0-15 Units Subcutaneous TID WC  . insulin aspart  0-5 Units Subcutaneous QHS  . insulin glargine  10 Units Subcutaneous QHS  . levothyroxine  125 mcg Oral QAC breakfast  . methylPREDNISolone (SOLU-MEDROL) injection  60 mg Intravenous Q12H  . mycophenolate  1,000 mg Oral BID  . pantoprazole  40 mg Oral Daily  . sodium chloride flush  3 mL Intravenous Q12H  . terazosin  10 mg Oral QHS   Continuous Infusions:    LOS: 3 days         Elliot Cousin, MD [facility] Pager [phone]  If 7PM-7AM, please contact night-coverage [email or web address] Password [email or web address] 07/24/2015, 10:45 AM

## 2015-07-25 DIAGNOSIS — J441 Chronic obstructive pulmonary disease with (acute) exacerbation: Secondary | ICD-10-CM

## 2015-07-25 LAB — COMPREHENSIVE METABOLIC PANEL
ALBUMIN: 2.5 g/dL — AB (ref 3.5–5.0)
ALT: 86 U/L — ABNORMAL HIGH (ref 17–63)
AST: 34 U/L (ref 15–41)
Alkaline Phosphatase: 47 U/L (ref 38–126)
Anion gap: 8 (ref 5–15)
BILIRUBIN TOTAL: 0.4 mg/dL (ref 0.3–1.2)
BUN: 25 mg/dL — AB (ref 6–20)
CO2: 33 mmol/L — AB (ref 22–32)
Calcium: 8.7 mg/dL — ABNORMAL LOW (ref 8.9–10.3)
Chloride: 96 mmol/L — ABNORMAL LOW (ref 101–111)
Creatinine, Ser: 0.64 mg/dL (ref 0.61–1.24)
GFR calc Af Amer: >60 mL/min (ref >60)
GFR calc non Af Amer: >60 mL/min (ref >60)
GLUCOSE: 228 mg/dL — AB (ref 65–99)
POTASSIUM: 4.3 mmol/L (ref 3.5–5.1)
SODIUM: 137 mmol/L (ref 135–145)
TOTAL PROTEIN: 5.4 g/dL — AB (ref 6.5–8.1)

## 2015-07-25 LAB — CBC
HEMATOCRIT: 35.6 % — AB (ref 39.0–52.0)
Hemoglobin: 11.4 g/dL — ABNORMAL LOW (ref 13.0–17.0)
MCH: 29.6 pg (ref 26.0–34.0)
MCHC: 32 g/dL (ref 30.0–36.0)
MCV: 92.5 fL (ref 78.0–100.0)
Platelets: 278 10*3/uL (ref 150–400)
RBC: 3.85 MIL/uL — ABNORMAL LOW (ref 4.22–5.81)
RDW: 14.1 % (ref 11.5–15.5)
WBC: 10.9 10*3/uL — ABNORMAL HIGH (ref 4.0–10.5)

## 2015-07-25 LAB — GLUCOSE, CAPILLARY
GLUCOSE-CAPILLARY: 255 mg/dL — AB (ref 65–99)
Glucose-Capillary: 170 mg/dL — ABNORMAL HIGH (ref 65–99)

## 2015-07-25 LAB — CULTURE, RESPIRATORY

## 2015-07-25 LAB — CULTURE, RESPIRATORY W GRAM STAIN: Culture: NORMAL

## 2015-07-25 MED ORDER — CEFUROXIME AXETIL 500 MG PO TABS: 500.0000 mg | ORAL_TABLET | Freq: Two times a day (BID) | ORAL | Status: DC

## 2015-07-25 MED ORDER — PREDNISONE 10 MG PO TABS: ORAL_TABLET | ORAL | Status: DC

## 2015-07-25 MED ORDER — ALBUTEROL SULFATE (2.5 MG/3ML) 0.083% IN NEBU: 2.5000 mg | INHALATION_SOLUTION | Freq: Four times a day (QID) | RESPIRATORY_TRACT | Status: DC | PRN

## 2015-07-25 MED ORDER — LEVOTHYROXINE SODIUM 125 MCG PO TABS: 125.0000 ug | ORAL_TABLET | Freq: Every day | ORAL | Status: AC

## 2015-07-25 MED ORDER — ALBUTEROL SULFATE (2.5 MG/3ML) 0.083% IN NEBU: 2.5000 mg | INHALATION_SOLUTION | Freq: Four times a day (QID) | RESPIRATORY_TRACT | Status: AC | PRN

## 2015-07-25 NOTE — Care Management Important Message (Signed)
Important Message  Patient Details  Name: Raymond SkainsWilliam H [name] MRN: [id number] Date of Birth: 1929-07-21   Medicare Important Message Given:  Yes    Adonis HugueninBerkhead, Ruthe Roemer L, RN 07/25/2015, 9:21 AM

## 2015-07-25 NOTE — Progress Notes (Signed)
Instructions given on discharge medications,and follow up visits.Patient,and family verbalized understanding. Prescriptions sent with patient. No c/o pain or discomfort noted. Accompanied by staff to an awaiting vehicle.

## 2015-07-25 NOTE — Discharge Summary (Signed)
Physician Discharge Summary  Raymond [name] UEA:[id number] DOB: [birth year] DOA: 07/21/2015  PCP: Rudi Heap, MD  Admit date: 07/21/2015 Discharge date: 07/25/2015  Time spent:  Greater than 30 minutes  Recommendations for Outpatient Follow-up:  1.  Recommend follow-up of the patient's LFTs and blood glucose.     Discharge Diagnoses:   1. Acute on chronic respiratory failure with hypoxia.  2. Pulmonary fibrosis with exacerbation.  3. COPD with exacerbation.  4. Community-acquired pneumonia.  5. Elevated LFTs.  6. Steroid-induced hyperglycemia.  7. Hypothyroidism.  8. Hyperlipidemia.  9. BPH.   Discharge Condition:  Improved.  Diet recommendation:  Heart healthy.  Filed Weights   07/21/15 0812 07/21/15 0815  Weight: 79.379 kg (175 lb) 75.751 kg (167 lb)    History of present illness:  Patient is an 82 year old man with a history of chronic hypoxic respiratory failure, pulmonary fibrosis, COPD, and hypothyroidism, who presented to the ED on 07/21/2015 with a chief complaint of progressive shortness of breath. In the ED, he was afebrile and hemodynamically stable. His oxygen saturations were in the upper 80s to low 90s on 3 L of nasal cannula oxygen. His white blood cell count was elevated at 20.8. CT of his chest revealed  "progressive bilateral pulmonary parenchymal fibrosis right worse than left, dense consolidation in the right lower lobe with air bronchograms-possibility of superimposed pneumonia is not excluded". He was admitted for further evaluation and management.  Hospital Course:   Acute on chronic respiratory failure with hypoxia. - Patient was restarted on oxygen and titrated to keep his oxygen saturations greater than 90%. He typically uses 3-4 L of oxygen at home. His oxygen saturation is low normal on admission. Following treatment of his acute on chronic pulmonary fibrosis, pneumonia, and COPD exacerbation, his oxygen saturations improved to the 97-98% range  on 3-4 L of oxygen.  Pulmonary fibrosis  Patient is treated with 15 mg of prednisone daily and mycophenolate. He is followed by pulmonologist Dr. Hessie Dibble at [facility]. Patient was started on treatment with IV Solu-Medrol 60 mg every 12 hours. Eventually nebulizers were ordered and given. Over the course of the hospitalization, he became less symptomatic and there was better air movement on exam. He was discharged on a prednisone taper to be tapered over the next 6 days. Patient was instructed to resume 15 mg daily following the taper. -Further management will be deferred to Dr. Glynda Jaeger.  COPD with exacerbation. Patient demonstrated slightly more wheezing during the hospital course. This felt to be secondary to his airways opening up. He was started on duonebulizers every 6 hours. He uses albuterol nebulizer at home chronically. He was instructed to continue this at the time of discharge. His wheezing had nearly resolved prior to discharge.  Community-acquired pneumonia Patient was started on supportive treatment and Rocephin with azithromycin. A number of studies were ordered and his strep pneumo antigen was negative, Legionella antigen was negative, HIV was negative, blood cultures remained negative, and influenza panel was negative. His white blood cell count which was greater than 20,000 on admission, nearly normalized to 10.9 at the time of discharge. He received four and a half days of IV antibiotic. He was discharged on 3 more days of oral Ceftin.  Hyperlipidemia Patient was continued on statin therapy with exception of one day when it was held, secondary to elevated LFTs. His LFTs trended downward, therefore he was restarted on simvastatin at the time of discharge. Recommend monitoring his LFTs.  Steroid-induced hyperglycemia.  Patient's blood glucose  became elevated on steroid therapy. He was started on sliding scale NovoLog and Lantus. He has no prior history of diabetes. Hemoglobin A1c  was not ordered, but could be ordered in the outpatient setting. It is anticipated that his capillary blood glucose will normalize following the prednisone taper. Recommend outpatient monitoring. -Patient instructed to avoid high sugar foods and beverages over the next several days.  Hypothyroidism. Patient was continued on Synthroid. His TSH and free T4 within normal limits.  Elevated LFTs  Patient's LFTs were elevated with an AST of 77 and an ALT of 132 on admission. The levels trended downward before it was decided to hold the statin for 1 day. At the time of discharge, his AST had normalized to 34 and his ALT had trended down to 86. Etiology of this elevation could've been from pneumonia with some contribution from the statin. However, with improvement in his LFTs, it was more likely a reflection of pneumonia. Would recommend close outpatient monitoring.  Procedures:  None  Consultations:   none  Discharge Exam: Filed Vitals:   07/24/15 2239 07/25/15 0629  BP: 112/58 118/62  Pulse: 80 76  Temp: 98.5 F (36.9 C) 98.1 F (36.7 C)  Resp: 18 18   Oxygen saturation 97% on nasal cannula oxygen.  General:  Pleasant elderly man sitting up in bed, in no acute distress. Cardiovascular:  S1, S2, no murmurs rubs or gallops. Respiratory: Scattered fine crackles, much better air movement down to the bases, and near resolution of wheezing. Breathing nonlabored.  Discharge Instructions   Discharge Instructions    Diet - low sodium heart healthy    Complete by:  As directed      Increase activity slowly    Complete by:  As directed           Current Discharge Medication List    START taking these medications   Details  albuterol (PROVENTIL) (2.5 MG/3ML) 0.083% nebulizer solution Take 3 mLs (2.5 mg total) by nebulization every 6 (six) hours as needed for wheezing or shortness of breath. Qty: 75 mL, Refills: 12    cefUROXime (CEFTIN) 500 MG tablet Take 1 tablet (500 mg total) by  mouth 2 (two) times daily with a meal. Antibiotic to be taken as directed for 3 more days. Qty: 6 tablet, Refills: 0      CONTINUE these medications which have CHANGED   Details  levothyroxine (SYNTHROID, LEVOTHROID) 125 MCG tablet Take 1 tablet (125 mcg total) by mouth daily.    !! predniSONE (DELTASONE) 10 MG tablet Restart 1-1/2 tablets daily following the end of the prednisone taper.    !! predniSONE (DELTASONE) 10 MG tablet Starting 07/26/15, take 6 tablets daily for 1 day; then 5 tablets daily for 1 day; then 4 tablets daily for 1 day; then 3 tablets daily for 1 day; then 2 tablets daily for 1 day; then restart 1-1/2 tablets Qty: 20 tablet, Refills: 0     !! - Potential duplicate medications found. Please discuss with provider.    CONTINUE these medications which have NOT CHANGED   Details  aspirin EC 325 MG tablet Take 325 mg by mouth daily.    Cholecalciferol (VITAMIN D) 2000 UNITS CAPS Take 2,000-4,000 Units by mouth daily. Takes 2000 units everyday except for Fri, Sat, and Sun patient takes 4000 units.    citalopram (CELEXA) 20 MG tablet Take 1 tablet (20 mg total) by mouth daily. Qty: 90 tablet, Refills: 3    mycophenolate (CELLCEPT) 500 MG tablet  Take 2 tablets by mouth 2 (two) times daily.    omeprazole (PRILOSEC) 20 MG capsule Take 1 capsule (20 mg total) by mouth every evening. Qty: 90 capsule, Refills: 3    simvastatin (ZOCOR) 80 MG tablet Take 1 tablet (80 mg total) by mouth every evening. Qty: 90 tablet, Refills: 3    terazosin (HYTRIN) 10 MG capsule Take 1 capsule (10 mg total) by mouth at bedtime. Qty: 90 capsule, Refills: 3      STOP taking these medications     sulfamethoxazole-trimethoprim (BACTRIM DS,SEPTRA DS) 800-160 MG per tablet        Allergies  Allergen Reactions  . Codeine Other (See Comments)    hallucinations  . Levaquin [Levofloxacin In D5w] Other (See Comments)    Tendonitis   Follow-up Information    Follow up with Tamera Reason, MD.   Specialty:  Pulmonary Disease   Why:   Follow-up as scheduled.   Contact information:   [address] [place] [address] [phone]       Schedule an appointment as soon as possible for a visit with Rudi Heap, MD.   Specialty:  Family Medicine   Why:   to follow-up in 2 weeks.   Contact information:   [address] [place] [address] [phone]        The results of significant diagnostics from this hospitalization (including imaging, microbiology, ancillary and laboratory) are listed below for reference.    Significant Diagnostic Studies: Dg Chest 2 View  07/21/2015  CLINICAL DATA:  Productive cough, congestion, shortness of breath for 3 days EXAM: CHEST  2 VIEW COMPARISON:  03/27/2012 FINDINGS: Cardiomediastinal silhouette is stable. There is chronic elevation of the right hemidiaphragm. Progression of reticular peripheral fibrotic changes. No definite superimposed infiltrate or pulmonary edema. Again noted perihilar bronchiectasis. Mild degenerative changes thoracic spine. IMPRESSION: Progression of fibrotic changes. Again noted perihilar bronchiectasis. No definite superimposed infiltrate or pulmonary edema. Electronically Signed   By: Natasha Mead M.D.   On: 07/21/2015 08:44   Ct Chest Wo Contrast  07/21/2015  CLINICAL DATA:  Short of breath for 4 days getting worse EXAM: CT CHEST WITHOUT CONTRAST TECHNIQUE: Multidetector CT imaging of the chest was performed following the standard protocol without IV contrast. COMPARISON:  07/21/2015, 03/27/2012, 01/10/2012 FINDINGS: Mediastinum/Nodes: Mild aortic arch calcification. No aortic dilatation. Sub carinal adenopathy measuring 13 mm. No change in this appearance. No pericardial effusion. Antral posterior elongation of the upper trachea consistent with traction again identified. Lungs/Pleura: There are no pleural effusions. There is dense fibrotic change throughout the right upper lobe. In the medial right middle lobe  there is also fibrotic change. There is relatively extensive consolidation and fibrosis with air bronchograms in the inferior half of the right lower lobe. There is milder peripheral fibrosis laterally in the left upper lobe. There is also more mild consolidation at the left base. Upper abdomen: No acute findings Musculoskeletal: No acute findings IMPRESSION: Progression of bilateral pulmonary parenchymal fibrosis right worse than left, when compared to prior CT scan 03/27/2012. Particularly dense consolidation is present in the right lower lobe, with air bronchograms present. The possibility of superimposed pneumonia is not excluded. Electronically Signed   By: Esperanza Heir M.D.   On: 07/21/2015 11:55    Microbiology: Recent Results (from the past 240 hour(s))  Culture, blood (routine x 2) Call MD if unable to obtain prior to antibiotics being given     Status: None (Preliminary result)   Collection  Time: 07/21/15  4:59 PM  Result Value Ref Range Status   Specimen Description BLOOD LEFT ARM  Final   Special Requests BOTTLES DRAWN AEROBIC AND ANAEROBIC 7CC  Final   Culture NO GROWTH 4 DAYS  Final   Report Status PENDING  Incomplete  Culture, blood (routine x 2) Call MD if unable to obtain prior to antibiotics being given     Status: None (Preliminary result)   Collection Time: 07/21/15  5:04 PM  Result Value Ref Range Status   Specimen Description BLOOD LEFT ARM  Final   Special Requests BOTTLES DRAWN AEROBIC AND ANAEROBIC 6CC  Final   Culture NO GROWTH 4 DAYS  Final   Report Status PENDING  Incomplete  Culture, sputum-assessment     Status: None   Collection Time: 07/21/15  5:30 PM  Result Value Ref Range Status   Specimen Description SPUTUM EXPECTORATED  Final   Special Requests NONE  Final   Sputum evaluation   Final    THIS SPECIMEN IS ACCEPTABLE. RESPIRATORY CULTURE REPORT TO FOLLOW. Performed at [facility]    Report Status 07/22/2015 FINAL  Final  Culture, respiratory  (NON-Expectorated)     Status: None   Collection Time: 07/21/15  5:30 PM  Result Value Ref Range Status   Specimen Description SPUTUM EXPECTORATED  Final   Special Requests NONE  Final   Gram Stain   Final    ABUNDANT WBC PRESENT,BOTH PMN AND MONONUCLEAR FEW SQUAMOUS EPITHELIAL CELLS PRESENT ABUNDANT GRAM POSITIVE COCCI IN PAIRS IN CHAINS ABUNDANT GRAM NEGATIVE RODS FEW GRAM POSITIVE RODS FEW YEAST THIS SPECIMEN IS ACCEPTABLE FOR SPUTUM CULTURE Performed at [facility]    Culture   Final    NORMAL OROPHARYNGEAL FLORA Performed at [facility]    Report Status 07/25/2015 FINAL  Final     Labs: Basic Metabolic Panel:  Recent Labs Lab 07/21/15 0823 07/21/15 1659 07/22/15 0623 07/24/15 0551 07/25/15 0507  NA 138  --  137 141 137  K 3.7  --  4.2 4.4 4.3  CL 95*  --  97* 98* 96*  CO2 29  --  30 31 33*  GLUCOSE 144*  --  189* 189* 228*  BUN 16  --  29* 27* 25*  CREATININE 0.66 0.96 0.67 0.76 0.64  CALCIUM 9.2  --  8.9 9.2 8.7*   Liver Function Tests:  Recent Labs Lab 07/21/15 0823 07/24/15 0551 07/25/15 0507  AST 77* 42* 34  ALT 132* 104* 86*  ALKPHOS 49 53 47  BILITOT 1.1 0.5 0.4  PROT 6.9 6.3* 5.4*  ALBUMIN 3.2* 2.8* 2.5*   No results for input(s): LIPASE, AMYLASE in the last 168 hours. No results for input(s): AMMONIA in the last 168 hours. CBC:  Recent Labs Lab 07/21/15 0823 07/21/15 1659 07/22/15 0623 07/23/15 0500 07/24/15 0551 07/25/15 0507  WBC 20.8* 17.3* 17.5* 17.5* 13.0* 10.9*  NEUTROABS 18.4*  --   --   --   --   --   HGB 12.9* 11.6* 11.6* 11.3* 12.5* 11.4*  HCT 39.8 36.4* 35.4* 34.3* 40.5 35.6*  MCV 92.1 92.2 91.5 92.5 92.9 92.5  PLT 235 230 246 275 285 278   Cardiac Enzymes:  Recent Labs Lab 07/21/15 0823  TROPONINI <0.03   BNP: BNP (last 3 results)  Recent Labs  07/21/15 0823  BNP 77.0    ProBNP (last 3 results) No results for input(s): PROBNP in the last 8760 hours.  CBG:  Recent Labs Lab  07/24/15 1255 07/24/15 1603 07/24/15 2040 07/25/15 0730 07/25/15 1148  GLUCAP 265* 218* 274* 170* 255*       Signed:  Keatin Benham MD.  [facility] 07/25/2015, 12:56 PM

## 2015-07-25 NOTE — Care Management Note (Signed)
Case Management Note  Patient Details  Name: Raymond SkainsWilliam H [name] MRN: [id number] Date of Birth: Jan 25, 1930  Subjective/Objective:   Spoke with patient and spouse. Anticipated discharge today. Patient stated ready to go home. Has appointment with [place] pul[place]ologist Monday in [place].  No new needs assessed.                   Action/Plan: Home with self care.   Expected Discharge Date:                  Expected Discharge Plan:  Home/Self Care  In-House Referral:     Discharge planning Services  CM Consult  Post Acute Care Choice:    Choice offered to:     DME Arranged:    DME Agency:     HH Arranged:    HH Agency:     Status of Service:  In process, will continue to follow  Medicare Important Message Given:  Yes Date Medicare IM Given:    Medicare IM give by:    Date Additional Medicare IM Given:    Additional Medicare Important Message give by:     If discussed at Long Length of Stay Meetings, dates discussed:    Additional Comments:  Adonis HugueninBerkhead, Najib Colmenares L, RN 07/25/2015, 10:44 AM

## 2015-07-26 LAB — CULTURE, BLOOD (ROUTINE X 2)
Culture: NO GROWTH
Culture: NO GROWTH

## 2015-07-28 ENCOUNTER — Telehealth: Payer: Self-pay | Admitting: *Deleted

## 2015-07-28 NOTE — Telephone Encounter (Signed)
Left message on home phone to return my call.  Call Completed and Appointment Scheduled: Yes, Date: 08/01/15 with Dr Silvestre MesiMoore   DISCHARGE INFORMATION Date of Discharge:07/25/15  [facility]  Principal Discharge Diagnosis: CAP  Patient and/or caregiver is knowledgeable of his/her condition(s) and treatment: Yes  MEDICATION RECONCILIATION Medication list reviewed with patient:Yes Discharge Medications reconciled with home medications.   Patient is able to obtain needed medications:Yes  ACTIVITIES OF DAILY LIVING  Is the patient able to perform his/her own ADLs: Yes.    Patient is receiving home health services: No.  PATIENT EDUCATION Questions/Concerns Discussed: Blood sugar was elevated in the hospital secondary to illness and prednisone. He reports that he has been checking his blood sugar regularly at home and it is normal, ranging from 80 to 103 fasting. He has completed the antibiotic and is almost done with the prednisone.

## 2015-08-01 ENCOUNTER — Ambulatory Visit (INDEPENDENT_AMBULATORY_CARE_PROVIDER_SITE_OTHER): Payer: Medicare Other | Admitting: Family Medicine

## 2015-08-01 ENCOUNTER — Encounter: Payer: Self-pay | Admitting: Family Medicine

## 2015-08-01 VITALS — BP 96/60 | HR 94 | Temp 97.1°F | Ht 66.0 in | Wt 167.0 lb

## 2015-08-01 DIAGNOSIS — J189 Pneumonia, unspecified organism: Secondary | ICD-10-CM | POA: Diagnosis not present

## 2015-08-01 DIAGNOSIS — J9621 Acute and chronic respiratory failure with hypoxia: Secondary | ICD-10-CM | POA: Diagnosis not present

## 2015-08-01 DIAGNOSIS — J8410 Pulmonary fibrosis, unspecified: Secondary | ICD-10-CM

## 2015-08-01 NOTE — Patient Instructions (Addendum)
Have your wife try DELSYM OTC cough syrup   The patient will continue to follow-up with his pulmonologist He will not wait so long before he is seen by a specialist when he gets sick the next time. He'll use his nebulizers as often as every 6 hours if necessary He should continue to drink plenty of fluids and stay well hydrated He should start the antibiotics immediately that he has been prescribed by his pulmonologist at the first sign of getting any worse respiratory wise We will recheck him in 2 weeks

## 2015-08-01 NOTE — Progress Notes (Signed)
Subjective:    Patient ID: Raymond [name], male    DOB: 05/14/1929, 82 y.o.   MRN: [id number]  HPI Patient here today for hospital follow up from [facility]. He was discharged on 07/25/15 with diagnosis of CAP and Respiratory failure. The patient was in the hospital he had an elevated white blood cell count a CT scan which showed worsening of his pulmonary fibrosis. There was concern of a possibility of superimposed pneumonia. He was treated with Ceftin and prednisone. He is already been to see his pulmonologist at [facility]. In coming in the office today's visit initial pulse ox was 77% after walking down the hall but once he got in the room and rested it went up to 93%. His blood pressure is 86/63. His weight is stable at 167 pounds.    Patient Active Problem List   Diagnosis Date Noted  . COPD with exacerbation ([place]) 07/24/2015  . Hyperglycemia, drug-induced 07/23/2015  . Elevated LFTs 07/23/2015  . CAP (community acquired pneumonia) 07/21/2015  . Acute on chronic respiratory failure with hypoxia ([place]) 07/21/2015  . Pulmonary fibrosis ([place]) 07/21/2015  . Leukocytosis 07/21/2015  . Vitamin D deficiency 02/19/2013  . BPH (benign prostatic hyperplasia) 09/04/2012  . Allergic rhinitis 09/24/2010  .  Cough, chronic 06/26/2010  . PULMONARY FIBROSIS ILD POST INFLAMMATORY CHRONIC 12/30/2009  . Hypothyroidism 12/29/2009  . Hyperlipidemia, acquired 12/29/2009  . Coronary atherosclerosis 12/29/2009   Outpatient Encounter Prescriptions as of 08/01/2015  Medication Sig  . albuterol (PROVENTIL) (2.5 MG/3ML) 0.083% nebulizer solution Take 3 mLs (2.5 mg total) by nebulization every 6 (six) hours as needed for wheezing or shortness of breath.  Marland Kitchen aspirin EC 325 MG tablet Take 325 mg by mouth daily.  . Cholecalciferol (VITAMIN D) 2000 UNITS CAPS Take 2,000-4,000 Units by mouth daily. Takes 2000 units everyday except for Fri, Sat, and Sun patient takes 4000 units.  . citalopram (CELEXA) 20 MG tablet  Take 1 tablet (20 mg total) by mouth daily.  Marland Kitchen levothyroxine (SYNTHROID, LEVOTHROID) 125 MCG tablet Take 1 tablet (125 mcg total) by mouth daily.  . mycophenolate (CELLCEPT) 500 MG tablet Take 2 tablets by mouth 2 (two) times daily.  Marland Kitchen omeprazole (PRILOSEC) 20 MG capsule Take 1 capsule (20 mg total) by mouth every evening.  . simvastatin (ZOCOR) 80 MG tablet Take 1 tablet (80 mg total) by mouth every evening.  . terazosin (HYTRIN) 10 MG capsule Take 1 capsule (10 mg total) by mouth at bedtime.  . [DISCONTINUED] predniSONE (DELTASONE) 10 MG tablet Starting 07/26/15, take 6 tablets daily for 1 day; then 5 tablets daily for 1 day; then 4 tablets daily for 1 day; then 3 tablets daily for 1 day; then 2 tablets daily for 1 day; then restart 1-1/2 tablets   No facility-administered encounter medications on file as of 08/01/2015.      Review of Systems  Constitutional: Positive for fatigue.  HENT: Negative.   Eyes: Negative.   Respiratory: Positive for shortness of breath.   Cardiovascular: Negative.   Gastrointestinal: Negative.   Endocrine: Negative.   Genitourinary: Negative.   Musculoskeletal: Negative.   Skin: Negative.   Allergic/Immunologic: Negative.   Neurological: Positive for weakness.  Hematological: Negative.   Psychiatric/Behavioral: Negative.        Objective:   Physical Exam  Constitutional: He is oriented to person, place, and time. He appears distressed.  HENT:  Head: Normocephalic and atraumatic.  Right Ear: External ear normal.  Left Ear: External ear normal.  Nose:  Nose normal.  Mouth/Throat: Oropharynx is clear and moist. No oropharyngeal exudate.  Eyes: Conjunctivae and EOM are normal. Pupils are equal, round, and reactive to light. Right eye exhibits no discharge. Left eye exhibits no discharge. No scleral icterus.  Neck: Normal range of motion. Neck supple. No thyromegaly present.  Cardiovascular: Normal rate, regular rhythm and normal heart sounds.   No murmur  heard. Heart is somewhat irregular at 96/m  Pulmonary/Chest: Effort normal. No respiratory distress. He has wheezes. He has rales. He exhibits no tenderness.  Patient has wheezes and crackles bilaterally.  Musculoskeletal: Normal range of motion. He exhibits edema. He exhibits no tenderness.  There is slight edema in the right ankle. He is taken back to the car in a wheelchair just to not stress his respiratory system anymore than necessary  Lymphadenopathy:    He has no cervical adenopathy.  Neurological: He is alert and oriented to person, place, and time.  Skin: Skin is warm and dry. No rash noted.  Psychiatric: He has a normal mood and affect. His behavior is normal. Judgment and thought content normal.  The patient appeared to be somewhat emotional at the visit today  Nursing note and vitals reviewed.   BP 86/63 mmHg  Pulse 94  Temp(Src) 97.1 F (36.2 C) (Oral)  Ht '5\' 6"'$  (1.676 m)  Wt 167 lb (75.751 kg)  BMI 26.97 kg/m2  SpO2 93%  Repeat blood pressure was 96/60 in the right arm sitting     Assessment & Plan:  1. Pulmonary fibrosis ([place]) -This is obviously worsening and the patient is aware of this especially as a result of his recent stay in the hospital and visit to his pulmonologist at [facility]  2. Acute on chronic respiratory failure with hypoxia (HCC) -Continue with nebulizer treatment and take antibiotic as recommended by pulmonologist at the first sign of any change in sputum color -Recheck patient in 2 weeks - BMP8+EGFR - CBC with Differential/Platelet  3. CAP (community acquired pneumonia) - BMP8+EGFR - CBC with Differential/Platelet  Patient Instructions  Have your wife try DELSYM OTC cough syrup   The patient will continue to follow-up with his pulmonologist He will not wait so long before he is seen by a specialist when he gets sick the next time. He'll use his nebulizers as often as every 6 hours if necessary He should continue to drink plenty of fluids  and stay well hydrated He should start the antibiotics immediately that he has been prescribed by his pulmonologist at the first sign of getting any worse respiratory wise We will recheck him in 2 weeks   Arrie Senate MD

## 2015-08-02 LAB — CBC WITH DIFFERENTIAL/PLATELET
BASOS ABS: 0 10*3/uL (ref 0.0–0.2)
BASOS: 0 %
EOS (ABSOLUTE): 0.1 10*3/uL (ref 0.0–0.4)
Eos: 1 %
HEMATOCRIT: 39.1 % (ref 37.5–51.0)
Hemoglobin: 12.5 g/dL — ABNORMAL LOW (ref 12.6–17.7)
IMMATURE GRANS (ABS): 0.1 10*3/uL (ref 0.0–0.1)
Immature Granulocytes: 1 %
LYMPHS: 10 %
Lymphocytes Absolute: 0.9 10*3/uL (ref 0.7–3.1)
MCH: 29.1 pg (ref 26.6–33.0)
MCHC: 32 g/dL (ref 31.5–35.7)
MCV: 91 fL (ref 79–97)
MONOS ABS: 0.5 10*3/uL (ref 0.1–0.9)
Monocytes: 6 %
NEUTROS ABS: 7.5 10*3/uL — AB (ref 1.4–7.0)
NEUTROS PCT: 82 %
Platelets: 202 10*3/uL (ref 150–379)
RBC: 4.29 x10E6/uL (ref 4.14–5.80)
RDW: 15 % (ref 12.3–15.4)
WBC: 9.1 10*3/uL (ref 3.4–10.8)

## 2015-08-02 LAB — BMP8+EGFR
BUN/Creatinine Ratio: 20 (ref 10–24)
BUN: 11 mg/dL (ref 8–27)
CALCIUM: 8.5 mg/dL — AB (ref 8.6–10.2)
CHLORIDE: 92 mmol/L — AB (ref 96–106)
CO2: 28 mmol/L (ref 18–29)
Creatinine, Ser: 0.56 mg/dL — ABNORMAL LOW (ref 0.76–1.27)
GFR calc Af Amer: 110 mL/min/{1.73_m2} (ref >59)
GFR, EST NON AFRICAN AMERICAN: 95 mL/min/{1.73_m2} (ref >59)
Glucose: 91 mg/dL (ref 65–99)
POTASSIUM: 4 mmol/L (ref 3.5–5.2)
SODIUM: 138 mmol/L (ref 134–144)

## 2015-08-15 ENCOUNTER — Encounter: Payer: Self-pay | Admitting: Family Medicine

## 2015-08-15 ENCOUNTER — Ambulatory Visit (INDEPENDENT_AMBULATORY_CARE_PROVIDER_SITE_OTHER): Payer: Medicare Other | Admitting: Family Medicine

## 2015-08-15 VITALS — BP 95/62 | HR 84 | Temp 97.1°F | Ht 66.0 in | Wt 165.0 lb

## 2015-08-15 DIAGNOSIS — J8410 Pulmonary fibrosis, unspecified: Secondary | ICD-10-CM | POA: Diagnosis not present

## 2015-08-15 DIAGNOSIS — J9621 Acute and chronic respiratory failure with hypoxia: Secondary | ICD-10-CM

## 2015-08-15 DIAGNOSIS — J441 Chronic obstructive pulmonary disease with (acute) exacerbation: Secondary | ICD-10-CM | POA: Diagnosis not present

## 2015-08-15 NOTE — Patient Instructions (Addendum)
Continue with nebulizer treatments Protect  airways from the environment Stay cool Drink plenty of fluids and stay well hydrated Take antibiotic as directed if worsening in condition and get to the emergency room as soon as possible Continue to follow-up with pulmonology

## 2015-08-15 NOTE — Progress Notes (Signed)
Subjective:    Patient ID: Raymond SkainsWilliam H Schaab, male    DOB: September 14, 1929, 82 y.o.   MRN: [id number]  HPI Patient here today for 2 week follow up on SOB, BP and BS. He states that his BS and BP is better, but his breathing is about the same.The patient is followed [facility] and by Dr. Juanetta GoslingHawkins and reasonable as his pulmonologist. He does not have an appointment with either of these people for 2 or 3 months. He is on a treatment regimen from [facility], Dr. Glynda JaegerGilstrap. He is still taking prednisone 15 mg daily. As far as his breathing is concerned he says this is no better it is no worse. He has not been out of the house a lot. I can tell that he is very frustrated. He denies any other symptoms like chest pain GI symptoms or voiding symptoms.       Patient Active Problem List   Diagnosis Date Noted  . COPD with exacerbation (HCC) 07/24/2015  . Hyperglycemia, drug-induced 07/23/2015  . Elevated LFTs 07/23/2015  . CAP (community acquired pneumonia) 07/21/2015  . Acute on chronic respiratory failure with hypoxia (HCC) 07/21/2015  . Pulmonary fibrosis (H[facility]) 07/21/2015  . Leukocytosis 07/21/2015  . Vitamin D deficiency 02/19/2013  . BPH (benign prostatic hyperplasia) 09/04/2012  . Allergic rhinitis 09/24/2010  .  Cough, chronic 06/26/2010  . PULMONARY FIBROSIS ILD POST INFLAMMATORY CHRONIC 12/30/2009  . Hypothyroidism 12/29/2009  . Hyperlipidemia, acquired 12/29/2009  . Coronary atherosclerosis 12/29/2009   Outpatient Encounter Prescriptions as of 08/15/2015  Medication Sig  . albuterol (PROVENTIL) (2.5 MG/3ML) 0.083% nebulizer solution Take 3 mLs (2.5 mg total) by nebulization every 6 (six) hours as needed for wheezing or shortness of breath.  Marland Kitchen. aspirin EC 325 MG tablet Take 325 mg by mouth daily.  . Cholecalciferol (VITAMIN D) 2000 UNITS CAPS Take 2,000-4,000 Units by mouth daily. Takes 2000 units everyday except for Fri, Sat, and Sun patient takes 4000 units.  .  citalopram (CELEXA) 20 MG tablet Take 1 tablet (20 mg total) by mouth daily.  Marland Kitchen. levothyroxine (SYNTHROID, LEVOTHROID) 125 MCG tablet Take 1 tablet (125 mcg total) by mouth daily.  . mycophenolate (CELLCEPT) 500 MG tablet Take 2 tablets by mouth 2 (two) times daily.  Marland Kitchen. omeprazole (PRILOSEC) 20 MG capsule Take 1 capsule (20 mg total) by mouth every evening.  . simvastatin (ZOCOR) 80 MG tablet Take 1 tablet (80 mg total) by mouth every evening.  . terazosin (HYTRIN) 10 MG capsule Take 1 capsule (10 mg total) by mouth at bedtime.   No facility-administered encounter medications on file as of 08/15/2015.     Review of Systems  HENT: Negative.   Eyes: Negative.   Respiratory: Positive for shortness of breath.   Cardiovascular: Negative.   Gastrointestinal: Negative.   Endocrine: Negative.   Genitourinary: Negative.   Musculoskeletal: Negative.   Skin: Negative.   Allergic/Immunologic: Negative.   Neurological: Positive for weakness.  Hematological: Negative.   Psychiatric/Behavioral: Negative.        Objective:   Physical Exam  Constitutional: He is oriented to person, place, and time. He appears well-developed and well-nourished.  HENT:  Head: Normocephalic and atraumatic.  Right Ear: External ear normal.  Left Ear: External ear normal.  Nose: Nose normal.  Mouth/Throat: Oropharynx is clear and moist. No oropharyngeal exudate.  Eyes: Conjunctivae and EOM are normal. Pupils are equal, round, and reactive to light. Right eye exhibits no discharge. Left eye  exhibits no discharge. No scleral icterus.  Neck: Normal range of motion. Neck supple. No thyromegaly present.  Cardiovascular: Normal rate, regular rhythm and normal heart sounds.   No murmur heard. Pulmonary/Chest: Effort normal. No respiratory distress. He has wheezes. He exhibits no tenderness.  The patient's breath sounds are greatly diminished bilaterally. There are scattered wheezes and rhonchi with coughing. This is no  worse than previous. It is no better than previous.  Musculoskeletal: Normal range of motion. He exhibits no edema or tenderness.  The patient's range of motion is limited by his respiratory condition.  Lymphadenopathy:    He has no cervical adenopathy.  Neurological: He is alert and oriented to person, place, and time.  Skin: Skin is warm and dry. No rash noted.  Psychiatric: He has a normal mood and affect. His behavior is normal. Judgment and thought content normal.  Considering the seriousness of the situation the patient seems to be calm and excepting of his illness.  Nursing note and vitals reviewed.  BP 95/62 mmHg  Pulse 84  Temp(Src) 97.1 F (36.2 C) (Oral)  Ht  (1.676 m)  Wt 165 lb (74.844 kg)  BMI 26.64 kg/m2  SpO2 92%        Assessment & Plan:  1. COPD with exacerbation (HCC) -Continue with aggressive nebulizer treatments and allergen avoidance.  2. Pulmonary fibrosis (HCC) -Continue with follow-up with pulmonary specialist  3. Acute on chronic respiratory failure with hypoxia (HCC) -Continue with follow-up with pulmonary specialist and [place] as needed  Patient Instructions  Continue with nebulizer treatments Protect  airways from the environment Stay cool Drink plenty of fluids and stay well hydrated Take antibiotic as directed if worsening in condition and get to the emergency room as soon as possible Continue to follow-up with pulmonology   Nyra Capes MD

## 2015-09-01 ENCOUNTER — Ambulatory Visit: Payer: Medicare Other | Admitting: Family Medicine

## 2015-09-01 ENCOUNTER — Encounter: Payer: Self-pay | Admitting: *Deleted

## 2015-09-19 ENCOUNTER — Telehealth: Payer: Self-pay | Admitting: Family Medicine

## 2015-09-19 NOTE — Telephone Encounter (Signed)
Appt given per patients request 

## 2015-09-22 ENCOUNTER — Encounter: Payer: Self-pay | Admitting: Family Medicine

## 2015-09-22 ENCOUNTER — Ambulatory Visit (INDEPENDENT_AMBULATORY_CARE_PROVIDER_SITE_OTHER): Payer: Medicare Other | Admitting: Family Medicine

## 2015-09-22 VITALS — BP 109/68 | HR 83 | Temp 96.7°F | Ht 66.0 in | Wt 167.8 lb

## 2015-09-22 DIAGNOSIS — M75102 Unspecified rotator cuff tear or rupture of left shoulder, not specified as traumatic: Secondary | ICD-10-CM | POA: Diagnosis not present

## 2015-09-22 NOTE — Patient Instructions (Signed)
Great to meet you!  You will get a call from physical therapy, start after waiting 1 week.

## 2015-09-22 NOTE — Progress Notes (Signed)
   HPI  Patient presents today here with left shoulder pain and right ear "blocked".  Right ear Feels like it's been blocked and clogged up for about 1-2 weeks. Has slightly decreased hearing, has had good results with previous ear washings.  Left shoulder States that he was getting out of bed when he went a little bit faster than expected due to his right leg giving out when he caught himself with his right arm causing left shoulder pain. He's worried about rotator cuff tear He's been told he cannot have surgery due to pulmonary fibrosis.   PMH: Smoking status noted ROS: Per HPI  Objective: BP 109/68 mmHg  Pulse 83  Temp(Src) 96.7 F (35.9 C) (Oral)  Ht 5\' 6"  (1.676 m)  Wt 167 lb 12.8 oz (76.114 kg)  BMI 27.10 kg/m2  SpO2 91% Gen: NAD, alert, cooperative with exam HEENT: NCAT, TMs normal bilaterally, ear canals completely clean CV: RRR, good S1/S2, no murmur Resp: CTABL, no wheezes, non-labored Ext: No edema, warm Neuro: Alert and oriented, No gross deficits  MSK:  No tenderness to palpation of similar or bony landmarks, area of tenderness is around the insertion of the teres minor Severe tenderness with it he can test and Hawkins tests Limited range of motion with being unable to flex his arms over 90 with abduction or  flexion  L shoulder injection Informed consent obtained and placed in chart.  Area cleaned with iodine x 3 and wiped clear with alcohol swab.  Using 21 1/2 gauge needle 1 cc Kenalog and 3 cc's 1% Lidocaine were injected in subacromial space via posterior approach.  Sterile bandage placed.  Patient tolerated procedure well.  No complications.     Assessment and plan:  # Rotator cuff syndrome Likely rotator cuff tear, however given her comorbidities and age he is unlikely to be able to get surgery. He would like to get injection today Physical therapy order Consider referral to sports medicine if persistent pain  # Ear fullness Normal tympanic  membrane Reassurance provided   Murtis SinkSam Bradshaw, MD [facility] 09/22/2015, 4:10 PM

## 2015-09-23 ENCOUNTER — Ambulatory Visit: Payer: Medicare Other | Admitting: Family Medicine

## 2015-11-14 ENCOUNTER — Other Ambulatory Visit (HOSPITAL_COMMUNITY): Payer: Self-pay | Admitting: Pulmonary Disease

## 2015-11-14 DIAGNOSIS — R7989 Other specified abnormal findings of blood chemistry: Secondary | ICD-10-CM

## 2015-11-14 DIAGNOSIS — R945 Abnormal results of liver function studies: Principal | ICD-10-CM

## 2015-11-19 ENCOUNTER — Ambulatory Visit: Payer: Medicare Other | Admitting: Family Medicine

## 2015-11-19 ENCOUNTER — Ambulatory Visit (HOSPITAL_COMMUNITY)
Admission: RE | Admit: 2015-11-19 | Discharge: 2015-11-19 | Disposition: A | Discharge status: Home / Self Care | Payer: Medicare Other | Source: Ambulatory Visit | Attending: Pulmonary Disease | Admitting: Pulmonary Disease

## 2015-11-19 DIAGNOSIS — R945 Abnormal results of liver function studies: Secondary | ICD-10-CM | POA: Insufficient documentation

## 2015-11-19 DIAGNOSIS — R93422 Abnormal radiologic findings on diagnostic imaging of left kidney: Secondary | ICD-10-CM | POA: Insufficient documentation

## 2015-11-19 DIAGNOSIS — R93421 Abnormal radiologic findings on diagnostic imaging of right kidney: Secondary | ICD-10-CM | POA: Insufficient documentation

## 2015-11-19 DIAGNOSIS — R932 Abnormal findings on diagnostic imaging of liver and biliary tract: Secondary | ICD-10-CM | POA: Diagnosis not present

## 2015-11-19 DIAGNOSIS — R7989 Other specified abnormal findings of blood chemistry: Secondary | ICD-10-CM

## 2015-11-19 DIAGNOSIS — Z9049 Acquired absence of other specified parts of digestive tract: Secondary | ICD-10-CM | POA: Insufficient documentation

## 2015-11-25 ENCOUNTER — Encounter: Payer: Self-pay | Admitting: Family Medicine

## 2015-11-25 ENCOUNTER — Ambulatory Visit (INDEPENDENT_AMBULATORY_CARE_PROVIDER_SITE_OTHER): Payer: Medicare Other | Admitting: Family Medicine

## 2015-11-25 VITALS — BP 120/63 | HR 84 | Temp 97.4°F | Ht 66.0 in | Wt 167.0 lb

## 2015-11-25 DIAGNOSIS — N400 Enlarged prostate without lower urinary tract symptoms: Secondary | ICD-10-CM | POA: Diagnosis not present

## 2015-11-25 DIAGNOSIS — J8410 Pulmonary fibrosis, unspecified: Secondary | ICD-10-CM | POA: Diagnosis not present

## 2015-11-25 DIAGNOSIS — E785 Hyperlipidemia, unspecified: Secondary | ICD-10-CM

## 2015-11-25 DIAGNOSIS — Z79899 Other long term (current) drug therapy: Secondary | ICD-10-CM

## 2015-11-25 DIAGNOSIS — I2510 Atherosclerotic heart disease of native coronary artery without angina pectoris: Secondary | ICD-10-CM

## 2015-11-25 DIAGNOSIS — E039 Hypothyroidism, unspecified: Secondary | ICD-10-CM | POA: Diagnosis not present

## 2015-11-25 DIAGNOSIS — E559 Vitamin D deficiency, unspecified: Secondary | ICD-10-CM | POA: Diagnosis not present

## 2015-11-25 MED ORDER — AZITHROMYCIN 250 MG PO TABS: ORAL_TABLET | ORAL | 1 refills | Status: DC

## 2015-11-25 MED ORDER — AMOXICILLIN-POT CLAVULANATE 875-125 MG PO TABS: 1.0000 | ORAL_TABLET | Freq: Two times a day (BID) | ORAL | 1 refills | Status: DC

## 2015-11-25 NOTE — Progress Notes (Signed)
Subjective:    Patient ID: Raymond [name], male    DOB: 25-Feb-1930, 82 y.o.   MRN: [id number]  HPI Pt here for follow up and management of chronic medical problems which includes hyperlipidemia and hypothyroid. He is taking medications regularly.The patient also has some concerns regarding his liver tests being elevated and an ultrasound that was done of his abdomen that indicated a fatty liver with an absent gallbladder because of his history of cholecystectomy. The patient has also had a hepatitis panel done and this was negative. A copy of this is mentating will be scanned into the record. This patient is followed by pulmonologist at [facility] because of his pulmonary fibrosis and also by the local pulmonologist Dr. Luan Pulling. We will repeat lab work today and pending the results of this lab work we will decide whether to go see the gastroenterologist or not. His gait physician did take him off of the statin drug simvastatin for statin. The patient did not have any aches or pains with this. He describes no chest pain. He is no more short of breath than usual and this happens with only walking a short distance. He is in no problems with his GI tract including nausea vomiting diarrhea blood in the stool or black tarry bowel movements. He is passing his water without problems. He does not seem to be in any respiratory distress today during the visit. He comes to the visit with his wife.    Patient Active Problem List   Diagnosis Date Noted  . COPD with exacerbation ([place]) 07/24/2015  . Hyperglycemia, drug-induced 07/23/2015  . Elevated LFTs 07/23/2015  . CAP (community acquired pneumonia) 07/21/2015  . Acute on chronic respiratory failure with hypoxia ([place]) 07/21/2015  . Pulmonary fibrosis ([place]) 07/21/2015  . Leukocytosis 07/21/2015  . Vitamin D deficiency 02/19/2013  . BPH (benign prostatic hyperplasia) 09/04/2012  . Allergic rhinitis 09/24/2010  .  Cough, chronic 06/26/2010  . PULMONARY  FIBROSIS ILD POST INFLAMMATORY CHRONIC 12/30/2009  . Hypothyroidism 12/29/2009  . Hyperlipidemia, acquired 12/29/2009  . Coronary atherosclerosis 12/29/2009   Outpatient Encounter Prescriptions as of 11/25/2015  Medication Sig  . albuterol (PROVENTIL) (2.5 MG/3ML) 0.083% nebulizer solution Take 3 mLs (2.5 mg total) by nebulization every 6 (six) hours as needed for wheezing or shortness of breath.  Marland Kitchen aspirin EC 325 MG tablet Take 325 mg by mouth daily.  . Cholecalciferol (VITAMIN D) 2000 UNITS CAPS Take 2,000-4,000 Units by mouth daily. Takes 2000 units everyday except for Fri, Sat, and Sun patient takes 4000 units.  . citalopram (CELEXA) 20 MG tablet Take 1 tablet (20 mg total) by mouth daily.  Marland Kitchen levothyroxine (SYNTHROID, LEVOTHROID) 125 MCG tablet Take 1 tablet (125 mcg total) by mouth daily.  . mycophenolate (CELLCEPT) 500 MG tablet Take 2 tablets by mouth 2 (two) times daily.  Marland Kitchen omeprazole (PRILOSEC) 20 MG capsule Take 1 capsule (20 mg total) by mouth every evening.  . terazosin (HYTRIN) 10 MG capsule Take 1 capsule (10 mg total) by mouth at bedtime.  . [DISCONTINUED] simvastatin (ZOCOR) 80 MG tablet Take 1 tablet (80 mg total) by mouth every evening.   No facility-administered encounter medications on file as of 11/25/2015.       Review of Systems  Constitutional: Negative.   HENT: Negative.   Eyes: Negative.   Respiratory: Negative.   Cardiovascular: Negative.   Gastrointestinal: Negative.        Recent elevated LFT and liver US  Endocrine: Negative.   Genitourinary: Negative.  Musculoskeletal: Negative.   Skin: Negative.   Allergic/Immunologic: Negative.   Neurological: Negative.   Hematological: Negative.   Psychiatric/Behavioral: Negative.        Objective:   Physical Exam  Constitutional: He is oriented to person, place, and time. He appears well-developed and well-nourished. No distress.  The patient was alert and calm.  HENT:  Head: Normocephalic and atraumatic.   Right Ear: External ear normal.  Left Ear: External ear normal.  Nose: Nose normal.  Mouth/Throat: Oropharynx is clear and moist. No oropharyngeal exudate.  Clear rhinorrhea with slight nasal congestion.  Eyes: Conjunctivae and EOM are normal. Pupils are equal, round, and reactive to light. Right eye exhibits no discharge. Left eye exhibits no discharge. No scleral icterus.  Neck: Normal range of motion. Neck supple. No thyromegaly present.  No bruits thyromegaly or anterior cervical adenopathy  Cardiovascular: Normal rate and regular rhythm.   No murmur heard. Heart had a regular rate and rhythm at 72/m  Pulmonary/Chest: Effort normal and breath sounds normal. No respiratory distress. He has no wheezes. He has no rales. He exhibits no tenderness.  Diminished breath sounds bilaterally with crackles at both lung bases posteriorly  Abdominal: Soft. Bowel sounds are normal. He exhibits no mass. There is no tenderness. There is no rebound and no guarding.  Musculoskeletal: Normal range of motion. He exhibits no edema.  Lymphadenopathy:    He has no cervical adenopathy.  Neurological: He is alert and oriented to person, place, and time. He has normal reflexes. No cranial nerve deficit.  Skin: Skin is warm and dry. No rash noted.  Psychiatric: He has a normal mood and affect. His behavior is normal. Judgment and thought content normal.  Nursing note and vitals reviewed.   BP 120/63 (BP Location: Right Arm)   Pulse 84   Temp 97.4 F (36.3 C) (Oral)   Ht 5\' 6"  (1.676 m)   Wt 167 lb (75.8 kg)   SpO2 92% Comment: on oxygen  BMI 26.95 kg/m        Assessment & Plan:  1. Vitamin D deficiency -Continue current treatment pending results of lab work - CBC with Differential/Platelet - VITAMIN D 25 Hydroxy (Vit-D Deficiency, Fractures)  2. Hypothyroidism, unspecified hypothyroidism type -Continue current treatment pending results of lab work - CBC with Differential/Platelet  3.  Hyperlipidemia -For now hold off on a statin drug until liver function tests are returned - Lipid panel - BMP8+EGFR - CBC with Differential/Platelet - Hepatic function panel  4. BPH (benign prostatic hyperplasia) -No complaints with this today. - CBC with Differential/Platelet  5. Atherosclerosis of native coronary artery of native heart without angina pectoris -Continue with therapeutic lifestyle changes - Lipid panel - CBC with Differential/Platelet  6. PULMONARY FIBROSIS ILD POST INFLAMMATORY CHRONIC -Continue follow-up with pulmonology  7. High risk medication use -Continue follow-up with pulmonology  Meds ordered this encounter  Medications  . azithromycin (ZITHROMAX) 250 MG tablet    Sig: As directed    Dispense:  6 tablet    Refill:  1  . amoxicillin-clavulanate (AUGMENTIN) 875-125 MG tablet    Sig: Take 1 tablet by mouth 2 (two) times daily.    Dispense:  20 tablet    Refill:  1   Patient Instructions                       Medicare Annual Wellness Visit  [place] and the medical providers at [facility] strive to bring  you the best medical care.  In doing so we not only want to address your current medical conditions and concerns but also to detect new conditions early and prevent illness, disease and health-related problems.    Medicare offers a yearly Wellness Visit which allows our clinical staff to assess your need for preventative services including immunizations, lifestyle education, counseling to decrease risk of preventable diseases and screening for fall risk and other medical concerns.    This visit is provided free of charge (no copay) for all Medicare recipients. The clinical pharmacists at [facility] have begun to conduct these Wellness Visits which will also include a thorough review of all your medications.    As you primary medical provider recommend that you make an appointment for your Annual  Wellness Visit if you have not done so already this year.  You may set up this appointment before you leave today or you may call back [phone]) and schedule an appointment.  Please make sure when you call that you mention that you are scheduling your Annual Wellness Visit with the clinical pharmacist so that the appointment may be made for the proper length of time.     Continue current medications. Continue good therapeutic lifestyle changes which include good diet and exercise. Fall precautions discussed with patient. If an FOBT was given today- please return it to our front desk. If you are over 66 years old - you may need Prevnar 62 or the adult Pneumonia vaccine.   After your visit with [place] today you will receive a survey in the mail or online from [employer] regarding your care with [place]. Please take a moment to fill this out. Your feedback is very important to [place] as you can help [place] better understand your patient needs as well as improve your experience and satisfaction. WE CARE ABOUT YOU!!!   We will have the patient to remain off of his statin drug. We will call him in the liver function tests are returned if they continue to go up we will ask him to see the gastroenterologist. If it is a fatty liver and since his hepatitis panel was negative we may consider restarting a statin drug like atorvastatin. This will depend on future lab work and cholesterol numbers. The patient should avoid irritating environments and should use hand cleansers regularly. He should continue to follow-up with the pulmonologist.  Arrie Senate MD

## 2015-11-25 NOTE — Patient Instructions (Addendum)
Medicare Annual Wellness Visit  [place] and the medical providers at [facility] strive to bring you the best medical care.  In doing so we not only want to address your current medical conditions and concerns but also to detect new conditions early and prevent illness, disease and health-related problems.    Medicare offers a yearly Wellness Visit which allows our clinical staff to assess your need for preventative services including immunizations, lifestyle education, counseling to decrease risk of preventable diseases and screening for fall risk and other medical concerns.    This visit is provided free of charge (no copay) for all Medicare recipients. The clinical pharmac[facility] at [facility] have begun to conduct these Wellness Visits which will also include a thorough review of all your medications.    As you primary medical provider recommend that you make an appointment for your Annual Wellness Visit if you have not done so already this year.  You may set up this appointment before you leave today or you may call back [phone] and schedule an appointment.  Please make sure when you call that you mention that you are scheduling your Annual Wellness Visit with the clinical pharmacist so that the appointment may be made for the proper length of time.     Continue current medications. Continue good therapeutic lifestyle changes which include good diet and exercise. Fall precautions discussed with patient. If an FOBT was given today- please return it to our front desk. If you are over 80 years old - you may need Prevnar 13 or the adult Pneumonia vaccine.   After your visit with us today you will receive a survey in the mail or online from [employer] regarding your care with us. Please take a moment to fill this out. Your feedback is very important to us as you can help us better understand your patient needs as well as  improve your experience and satisfaction. WE CARE ABOUT YOU!!!   We will have the patient to remain off of his statin drug. We will call him in the liver function tests are returned if they continue to go up we will ask him to see the gastroenterologist. If it is a fatty liver and since his hepatitis panel was negative we may consider restarting a statin drug like atorvastatin. This will depend on future lab work and cholesterol numbers. The patient should avoid irritating environments and should use hand cleansers regularly. He should continue to follow-up with the pulmonologist.

## 2015-11-26 ENCOUNTER — Other Ambulatory Visit: Payer: Self-pay | Admitting: *Deleted

## 2015-11-26 LAB — BMP8+EGFR
BUN / CREAT RATIO: 22 (ref 10–24)
BUN: 14 mg/dL (ref 8–27)
CO2: 30 mmol/L — ABNORMAL HIGH (ref 18–29)
CREATININE: 0.65 mg/dL — AB (ref 0.76–1.27)
Calcium: 9.5 mg/dL (ref 8.6–10.2)
Chloride: 92 mmol/L — ABNORMAL LOW (ref 96–106)
GFR calc non Af Amer: 89 mL/min/{1.73_m2} (ref >59)
GFR, EST AFRICAN AMERICAN: 103 mL/min/{1.73_m2} (ref >59)
Glucose: 107 mg/dL — ABNORMAL HIGH (ref 65–99)
Potassium: 4.1 mmol/L (ref 3.5–5.2)
Sodium: 137 mmol/L (ref 134–144)

## 2015-11-26 LAB — CBC WITH DIFFERENTIAL/PLATELET
Basophils Absolute: 0 10*3/uL (ref 0.0–0.2)
Basos: 0 %
EOS (ABSOLUTE): 0 10*3/uL (ref 0.0–0.4)
EOS: 0 %
HEMATOCRIT: 39.8 % (ref 37.5–51.0)
HEMOGLOBIN: 12.7 g/dL (ref 12.6–17.7)
Immature Grans (Abs): 0.1 10*3/uL (ref 0.0–0.1)
Immature Granulocytes: 1 %
LYMPHS ABS: 2.1 10*3/uL (ref 0.7–3.1)
Lymphs: 19 %
MCH: 29.3 pg (ref 26.6–33.0)
MCHC: 31.9 g/dL (ref 31.5–35.7)
MCV: 92 fL (ref 79–97)
MONOCYTES: 6 %
Monocytes Absolute: 0.7 10*3/uL (ref 0.1–0.9)
NEUTROS ABS: 8.3 10*3/uL — AB (ref 1.4–7.0)
Neutrophils: 74 %
Platelets: 267 10*3/uL (ref 150–379)
RBC: 4.33 x10E6/uL (ref 4.14–5.80)
RDW: 14 % (ref 12.3–15.4)
WBC: 11.2 10*3/uL — ABNORMAL HIGH (ref 3.4–10.8)

## 2015-11-26 LAB — HEPATIC FUNCTION PANEL
ALK PHOS: 46 IU/L (ref 39–117)
ALT: 28 IU/L (ref 0–44)
AST: 23 IU/L (ref 0–40)
Albumin: 4 g/dL (ref 3.5–4.7)
Bilirubin Total: 0.6 mg/dL (ref 0.0–1.2)
Bilirubin, Direct: 0.21 mg/dL (ref 0.00–0.40)
Total Protein: 6.2 g/dL (ref 6.0–8.5)

## 2015-11-26 LAB — LIPID PANEL
CHOL/HDL RATIO: 2.1 ratio (ref 0.0–5.0)
Cholesterol, Total: 233 mg/dL — ABNORMAL HIGH (ref 100–199)
HDL: 109 mg/dL (ref >39)
LDL Calculated: 99 mg/dL (ref 0–99)
TRIGLYCERIDES: 125 mg/dL (ref 0–149)
VLDL Cholesterol Cal: 25 mg/dL (ref 5–40)

## 2015-11-26 LAB — VITAMIN D 25 HYDROXY (VIT D DEFICIENCY, FRACTURES): VIT D 25 HYDROXY: 40.4 ng/mL (ref 30.0–100.0)

## 2015-11-26 MED ORDER — ATORVASTATIN CALCIUM 10 MG PO TABS: 10.0000 mg | ORAL_TABLET | Freq: Every day | ORAL | 3 refills | Status: AC

## 2015-12-29 ENCOUNTER — Other Ambulatory Visit (INDEPENDENT_AMBULATORY_CARE_PROVIDER_SITE_OTHER): Payer: Medicare Other

## 2015-12-29 DIAGNOSIS — R899 Unspecified abnormal finding in specimens from other organs, systems and tissues: Secondary | ICD-10-CM

## 2015-12-29 DIAGNOSIS — Z23 Encounter for immunization: Secondary | ICD-10-CM

## 2015-12-29 DIAGNOSIS — E785 Hyperlipidemia, unspecified: Secondary | ICD-10-CM

## 2015-12-30 LAB — CBC WITH DIFFERENTIAL/PLATELET
BASOS: 0 %
Basophils Absolute: 0 10*3/uL (ref 0.0–0.2)
EOS (ABSOLUTE): 0 10*3/uL (ref 0.0–0.4)
EOS: 0 %
HEMATOCRIT: 40.4 % (ref 37.5–51.0)
Hemoglobin: 12.8 g/dL (ref 12.6–17.7)
IMMATURE GRANS (ABS): 0.1 10*3/uL (ref 0.0–0.1)
IMMATURE GRANULOCYTES: 1 %
LYMPHS: 13 %
Lymphocytes Absolute: 1.2 10*3/uL (ref 0.7–3.1)
MCH: 29.7 pg (ref 26.6–33.0)
MCHC: 31.7 g/dL (ref 31.5–35.7)
MCV: 94 fL (ref 79–97)
MONOS ABS: 0.4 10*3/uL (ref 0.1–0.9)
Monocytes: 4 %
NEUTROS ABS: 7.6 10*3/uL — AB (ref 1.4–7.0)
NEUTROS PCT: 82 %
PLATELETS: 215 10*3/uL (ref 150–379)
RBC: 4.31 x10E6/uL (ref 4.14–5.80)
RDW: 13.8 % (ref 12.3–15.4)
WBC: 9.2 10*3/uL (ref 3.4–10.8)

## 2015-12-30 LAB — HEPATIC FUNCTION PANEL
ALT: 24 IU/L (ref 0–44)
AST: 23 IU/L (ref 0–40)
Albumin: 3.9 g/dL (ref 3.5–4.7)
Alkaline Phosphatase: 53 IU/L (ref 39–117)
BILIRUBIN TOTAL: 0.6 mg/dL (ref 0.0–1.2)
BILIRUBIN, DIRECT: 0.23 mg/dL (ref 0.00–0.40)
Total Protein: 6 g/dL (ref 6.0–8.5)

## 2016-03-31 ENCOUNTER — Encounter: Payer: Self-pay | Admitting: Family Medicine

## 2016-03-31 ENCOUNTER — Ambulatory Visit (INDEPENDENT_AMBULATORY_CARE_PROVIDER_SITE_OTHER): Payer: Medicare Other | Admitting: Family Medicine

## 2016-03-31 ENCOUNTER — Ambulatory Visit (INDEPENDENT_AMBULATORY_CARE_PROVIDER_SITE_OTHER): Payer: Medicare Other

## 2016-03-31 VITALS — BP 131/77 | HR 79 | Temp 97.0°F | Ht 66.0 in | Wt 170.0 lb

## 2016-03-31 DIAGNOSIS — E034 Atrophy of thyroid (acquired): Secondary | ICD-10-CM | POA: Diagnosis not present

## 2016-03-31 DIAGNOSIS — R0602 Shortness of breath: Secondary | ICD-10-CM

## 2016-03-31 DIAGNOSIS — E559 Vitamin D deficiency, unspecified: Secondary | ICD-10-CM | POA: Diagnosis not present

## 2016-03-31 DIAGNOSIS — I2510 Atherosclerotic heart disease of native coronary artery without angina pectoris: Secondary | ICD-10-CM | POA: Diagnosis not present

## 2016-03-31 DIAGNOSIS — N400 Enlarged prostate without lower urinary tract symptoms: Secondary | ICD-10-CM | POA: Diagnosis not present

## 2016-03-31 DIAGNOSIS — E7800 Pure hypercholesterolemia, unspecified: Secondary | ICD-10-CM

## 2016-03-31 DIAGNOSIS — R3989 Other symptoms and signs involving the genitourinary system: Secondary | ICD-10-CM

## 2016-03-31 LAB — MICROSCOPIC EXAMINATION
Bacteria, UA: NONE SEEN
Renal Epithel, UA: NONE SEEN /hpf

## 2016-03-31 LAB — URINALYSIS, COMPLETE
Bilirubin, UA: NEGATIVE
KETONES UA: NEGATIVE
Leukocytes, UA: NEGATIVE
NITRITE UA: NEGATIVE
Protein, UA: NEGATIVE
SPEC GRAV UA: 1.02 (ref 1.005–1.030)
UUROB: 2 mg/dL — AB (ref 0.2–1.0)
pH, UA: 6.5 (ref 5.0–7.5)

## 2016-03-31 MED ORDER — AMOXICILLIN-POT CLAVULANATE 875-125 MG PO TABS: 1.0000 | ORAL_TABLET | Freq: Two times a day (BID) | ORAL | 1 refills | Status: DC

## 2016-03-31 NOTE — Patient Instructions (Addendum)
Medicare Annual Wellness Visit  [place] and the medical providers at [facility] strive to bring you the best medical care.  In doing so we not only want to address your current medical conditions and concerns but also to detect new conditions early and prevent illness, disease and health-related problems.    Medicare offers a yearly Wellness Visit which allows our clinical staff to assess your need for preventative services including immunizations, lifestyle education, counseling to decrease risk of preventable diseases and screening for fall risk and other medical concerns.    This visit is provided free of charge (no copay) for all Medicare recipients. The [facility] have begun to conduct these Wellness Visits which will also include a thorough review of all your medications.    As you primary medical provider recommend that you make an appointment for your Annual Wellness Visit if you have not done so already this year.  You may set up this appointment before you leave today or you may call back [phone] and schedule an appointment.  Please make sure when you call that you mention that you are scheduling your Annual Wellness Visit with the clinical pharmacist so that the appointment may be made for the proper length of time.     Continue current medications. Continue good therapeutic lifestyle changes which include good diet and exercise. Fall precautions discussed with patient. If an FOBT was given today- please return it to our front desk. If you are over 81 years old - you may need Prevnar 13 or the adult Pneumonia vaccine.  **Flu shots are available--- please call and schedule a FLU-CLINIC appointment**  After your visit with us today you will receive a survey in the mail or online from [employer] regarding your care with us. Please take a moment to fill this out. Your feedback is very  important to us as you can help us better understand your patient needs as well as improve your experience and satisfaction. WE CARE ABOUT YOU!!!  The patient should use his albuterol inhaler as needed and maybe a little bit more frequently. He should also uses Symbicort regularly 2 puffs twice daily and rinse mouth after using He should take the antibiotic twice daily for 10 days with food He should call us in about a week as far as his progress is concerned He should take Mucinex one twice daily with a large glass of water He should use a cool mist humidifier in the room where he spends most of his time.

## 2016-03-31 NOTE — Progress Notes (Signed)
Subjective:    Patient ID: Raymond [name], male    DOB: Aug 26, 1929, 83 y.o.   MRN: [id number]  HPI  Pt here for follow up and management of chronic medical problems which includes hypothyroid and hyperlipidemia. He is taking medications regularly.The patient comes to the visit today with his wife. He is complaining of increased shortness of breath and cough and congestion with green sputum. He will get lab work today and a urinalysis. He would ask that we send a copy of his lab work to do and his pulmonologist there. His pulse ox was 85% on 3 L. When the patient is resting at home his pulse ox is around 94%. He did take a course of azithromycin and this was followed by Augmentin taking it once daily which she has been doing for several days currently. The sputum is still green and he feels more short of breath than he usually does. He denies any chest pain problems with his intestinal tract including nausea vomiting diarrhea or blood in the stool or any problems with passing his water. The pulse ox was repeated and we obtained 92%.    Patient Active Problem List   Diagnosis Date Noted  . COPD with exacerbation ([place]) 07/24/2015  . Hyperglycemia, drug-induced 07/23/2015  . Elevated LFTs 07/23/2015  . CAP (community acquired pneumonia) 07/21/2015  . Acute on chronic respiratory failure with hypoxia ([place]) 07/21/2015  . Pulmonary fibrosis ([place]) 07/21/2015  . Leukocytosis 07/21/2015  . Vitamin D deficiency 02/19/2013  . BPH (benign prostatic hyperplasia) 09/04/2012  . Allergic rhinitis 09/24/2010  .  Cough, chronic 06/26/2010  . PULMONARY FIBROSIS ILD POST INFLAMMATORY CHRONIC 12/30/2009  . Hypothyroidism 12/29/2009  . Hyperlipidemia, acquired 12/29/2009  . Coronary atherosclerosis 12/29/2009   Outpatient Encounter Prescriptions as of 03/31/2016  Medication Sig  . albuterol (PROVENTIL) (2.5 MG/3ML) 0.083% nebulizer solution Take 3 mLs (2.5 mg total) by nebulization every 6 (six) hours as  needed for wheezing or shortness of breath.  Marland Kitchen aspirin EC 325 MG tablet Take 325 mg by mouth daily.  Marland Kitchen atorvastatin (LIPITOR) 10 MG tablet Take 1 tablet (10 mg total) by mouth daily.  . Cholecalciferol (VITAMIN D) 2000 UNITS CAPS Take 2,000-4,000 Units by mouth daily. Takes 2000 units everyday except for Fri, Sat, and Sun patient takes 4000 units.  . citalopram (CELEXA) 20 MG tablet Take 1 tablet (20 mg total) by mouth daily.  Marland Kitchen levothyroxine (SYNTHROID, LEVOTHROID) 125 MCG tablet Take 1 tablet (125 mcg total) by mouth daily.  . mycophenolate (CELLCEPT) 500 MG tablet Take 2 tablets by mouth 2 (two) times daily.  Marland Kitchen omeprazole (PRILOSEC) 20 MG capsule Take 1 capsule (20 mg total) by mouth every evening.  . predniSONE (DELTASONE) 10 MG tablet Take 15 mg by mouth.   . terazosin (HYTRIN) 10 MG capsule Take 1 capsule (10 mg total) by mouth at bedtime.  . [DISCONTINUED] amoxicillin-clavulanate (AUGMENTIN) 875-125 MG tablet Take 1 tablet by mouth 2 (two) times daily.  . [DISCONTINUED] azithromycin (ZITHROMAX) 250 MG tablet As directed   No facility-administered encounter medications on file as of 03/31/2016.       Review of Systems  Constitutional: Negative.   HENT: Positive for congestion (yellow).   Eyes: Negative.   Respiratory: Positive for cough and shortness of breath (worse than usual).   Cardiovascular: Negative.   Gastrointestinal: Negative.   Endocrine: Negative.   Genitourinary: Negative.   Musculoskeletal: Negative.   Skin: Negative.   Allergic/Immunologic: Negative.   Neurological: Negative.  Hematological: Negative.   Psychiatric/Behavioral: Negative.        Objective:   Physical Exam  Constitutional: He is oriented to person, place, and time. He appears well-developed and well-nourished. No distress.  Patient is pleasant and alert and obviously having some issues with his breathing as usual. He is not a complainer.  HENT:  Head: Normocephalic and atraumatic.  Right Ear:  External ear normal.  Left Ear: External ear normal.  Mouth/Throat: Oropharynx is clear and moist. No oropharyngeal exudate.  Nasal congestion bilaterally  Eyes: Conjunctivae and EOM are normal. Pupils are equal, round, and reactive to light. Right eye exhibits no discharge. Left eye exhibits no discharge. No scleral icterus.  Neck: Normal range of motion. Neck supple. No thyromegaly present.  No bruits thyromegaly or anterior cervical adenopathy  Cardiovascular: Normal rate, regular rhythm and normal heart sounds.  Exam reveals no friction rub.   No murmur heard. The heart had a regular rate and rhythm at 60/m  Pulmonary/Chest: Effort normal. No respiratory distress. He has wheezes. He has rales. He exhibits no tenderness.  The patient had diminished breath sounds bilaterally with inhalation and he has increased congestion. He coughed up sputum on a couple of occasions and this seemed to clear the lungs out significantly and his breath sounds were improved though still diminished. The sputum was slightly yellow tinged.  Abdominal: Soft. Bowel sounds are normal. He exhibits no mass. There is no tenderness. There is no rebound and no guarding.  Musculoskeletal: Normal range of motion. He exhibits no edema.  Lymphadenopathy:    He has no cervical adenopathy.  Neurological: He is alert and oriented to person, place, and time.  Skin: Skin is warm and dry. No rash noted. No erythema.  Psychiatric: He has a normal mood and affect. His behavior is normal. Judgment and thought content normal.  Nursing note and vitals reviewed.  BP 131/77 (BP Location: Right Arm)   Pulse 79   Temp 97 F (36.1 C) (Oral)   Ht _0  (1.676 m)   Wt 170 lb (77.1 kg)   SpO2 (!) 85% Comment: on 3 liters of oxygen  BMI 27.44 kg/m         Assessment & Plan:  1. Pure hypercholesterolemia -Continue aggressive therapeutic lifestyle changes and current treatment pending results of lab work - BMP8+EGFR - CBC with  Differential/Platelet - Hepatic function panel - Lipid panel  2. Vitamin D deficiency -Continue current treatment pending results of lab work - CBC with Differential/Platelet - VITAMIN D 25 Hydroxy (Vit-D Deficiency, Fractures)  3. Benign prostatic hyperplasia, unspecified whether lower urinary tract symptoms present -No complaints with voiding today. - CBC with Differential/Platelet  4. Atherosclerosis of native coronary artery of native heart without angina pectoris -Follow-up as needed with cardiology and continue aggressive therapeutic lifestyle changes - BMP8+EGFR - CBC with Differential/Platelet - Hepatic function panel - Lipid panel  5. Hypothyroidism due to acquired atrophy of thyroid - CBC with Differential/Platelet - Thyroid Panel With TSH  6. Abnormal urine color - Urinalysis, Complete - Urine culture - CBC with Differential/Platelet  7. SOB (shortness of breath) -He will continue with his O2 and monitor his pulse ox at home. His pulse ox went from 88 initially to about 92 when it was rechecked after resting. He will continued to monitor this at home. - DG Chest 2 View; Future -He will increase the Augmentin and take one twice daily for 10 days and then go back to his routine treatment regimen. -  He will call us in one week. -We will send a copy of all lab work to Dr. Shana Chute  Meds ordered this encounter  Medications  . predniSONE (DELTASONE) 10 MG tablet    Sig: Take 15 mg by mouth.   Marland Kitchen amoxicillin-clavulanate (AUGMENTIN) 875-125 MG tablet    Sig: Take 1 tablet by mouth 2 (two) times daily.    Dispense:  60 tablet    Refill:  1   Patient Instructions                       Medicare Annual Wellness Visit  [facility] and the medical providers at [facility] strive to bring you the best medical care.  In doing so we not only want to address your current medical conditions and concerns but also to detect new conditions early and  prevent illness, disease and health-related problems.    Medicare offers a yearly Wellness Visit which allows our clinical staff to assess your need for preventative services including immunizations, lifestyle education, counseling to decrease risk of preventable diseases and screening for fall risk and other medical concerns.    This visit is provided free of charge (no copay) for all Medicare recipients. The clinical pharmacists at [facility] have begun to conduct these Wellness Visits which will also include a thorough review of all your medications.    As you primary medical provider recommend that you make an appointment for your Annual Wellness Visit if you have not done so already this year.  You may set up this appointment before you leave today or you may call back ([phone]) and schedule an appointment.  Please make sure when you call that you mention that you are scheduling your Annual Wellness Visit with the clinical pharmacist so that the appointment may be made for the proper length of time.     Continue current medications. Continue good therapeutic lifestyle changes which include good diet and exercise. Fall precautions discussed with patient. If an FOBT was given today- please return it to our front desk. If you are over 40 years old - you may need Prevnar 67 or the adult Pneumonia vaccine.  **Flu shots are available--- please call and schedule a FLU-CLINIC appointment**  After your visit with [place] today you will receive a survey in the mail or online from [employer] regarding your care with [place]. Please take a moment to fill this out. Your feedback is very important to [place] as you can help [place] better understand your patient needs as well as improve your experience and satisfaction. WE CARE ABOUT YOU!!!  The patient should use his albuterol inhaler as needed and maybe a little bit more frequently. He should also uses Symbicort regularly 2 puffs twice daily and  rinse mouth after using He should take the antibiotic twice daily for 10 days with food He should call us in about a week as far as his progress is concerned He should take Mucinex one twice daily with a large glass of water He should use a cool mist humidifier in the room where he spends most of his time.   Arrie Senate MD

## 2016-04-01 LAB — CBC WITH DIFFERENTIAL/PLATELET
BASOS: 0 %
Basophils Absolute: 0 10*3/uL (ref 0.0–0.2)
EOS (ABSOLUTE): 0 10*3/uL (ref 0.0–0.4)
Eos: 0 %
Hematocrit: 38.5 % (ref 37.5–51.0)
Hemoglobin: 12.1 g/dL — ABNORMAL LOW (ref 13.0–17.7)
IMMATURE GRANULOCYTES: 1 %
Immature Grans (Abs): 0.1 10*3/uL (ref 0.0–0.1)
Lymphocytes Absolute: 1.6 10*3/uL (ref 0.7–3.1)
Lymphs: 18 %
MCH: 29.6 pg (ref 26.6–33.0)
MCHC: 31.4 g/dL — ABNORMAL LOW (ref 31.5–35.7)
MCV: 94 fL (ref 79–97)
MONOS ABS: 0.4 10*3/uL (ref 0.1–0.9)
Monocytes: 4 %
NEUTROS PCT: 77 %
Neutrophils Absolute: 6.8 10*3/uL (ref 1.4–7.0)
PLATELETS: 210 10*3/uL (ref 150–379)
RBC: 4.09 x10E6/uL — AB (ref 4.14–5.80)
RDW: 14.6 % (ref 12.3–15.4)
WBC: 8.9 10*3/uL (ref 3.4–10.8)

## 2016-04-01 LAB — BMP8+EGFR
BUN/Creatinine Ratio: 24 (ref 10–24)
BUN: 16 mg/dL (ref 8–27)
CO2: 31 mmol/L — AB (ref 18–29)
Calcium: 9.4 mg/dL (ref 8.6–10.2)
Chloride: 93 mmol/L — ABNORMAL LOW (ref 96–106)
Creatinine, Ser: 0.66 mg/dL — ABNORMAL LOW (ref 0.76–1.27)
GFR calc Af Amer: 103 mL/min/{1.73_m2} (ref >59)
GFR calc non Af Amer: 89 mL/min/{1.73_m2} (ref >59)
GLUCOSE: 122 mg/dL — AB (ref 65–99)
POTASSIUM: 4.4 mmol/L (ref 3.5–5.2)
SODIUM: 139 mmol/L (ref 134–144)

## 2016-04-01 LAB — LIPID PANEL
CHOLESTEROL TOTAL: 221 mg/dL — AB (ref 100–199)
Chol/HDL Ratio: 2.3 ratio units (ref 0.0–5.0)
HDL: 98 mg/dL (ref >39)
LDL Calculated: 101 mg/dL — ABNORMAL HIGH (ref 0–99)
Triglycerides: 108 mg/dL (ref 0–149)
VLDL CHOLESTEROL CAL: 22 mg/dL (ref 5–40)

## 2016-04-01 LAB — HEPATIC FUNCTION PANEL
ALT: 18 IU/L (ref 0–44)
AST: 18 IU/L (ref 0–40)
Albumin: 3.6 g/dL (ref 3.5–4.7)
Alkaline Phosphatase: 44 IU/L (ref 39–117)
BILIRUBIN, DIRECT: 0.28 mg/dL (ref 0.00–0.40)
Bilirubin Total: 0.9 mg/dL (ref 0.0–1.2)
Total Protein: 5.8 g/dL — ABNORMAL LOW (ref 6.0–8.5)

## 2016-04-01 LAB — VITAMIN D 25 HYDROXY (VIT D DEFICIENCY, FRACTURES): VIT D 25 HYDROXY: 41.4 ng/mL (ref 30.0–100.0)

## 2016-04-01 LAB — THYROID PANEL WITH TSH
FREE THYROXINE INDEX: 2.2 (ref 1.2–4.9)
T3 UPTAKE RATIO: 29 % (ref 24–39)
T4 TOTAL: 7.5 ug/dL (ref 4.5–12.0)
TSH: 8.53 u[IU]/mL — ABNORMAL HIGH (ref 0.450–4.500)

## 2016-04-01 NOTE — Addendum Note (Signed)
Addended by: Tamera PuntWRAY, WENDY S on: 04/01/2016 11:42 AM   Modules accepted: Orders

## 2016-04-02 LAB — URINE CULTURE: Organism ID, Bacteria: NO GROWTH

## 2016-04-30 ENCOUNTER — Other Ambulatory Visit: Payer: Medicare Other

## 2016-04-30 DIAGNOSIS — E034 Atrophy of thyroid (acquired): Secondary | ICD-10-CM

## 2016-04-30 DIAGNOSIS — R300 Dysuria: Secondary | ICD-10-CM

## 2016-04-30 LAB — MICROSCOPIC EXAMINATION
BACTERIA UA: NONE SEEN
Epithelial Cells (non renal): NONE SEEN /hpf (ref 0–10)
Renal Epithel, UA: NONE SEEN /hpf
WBC, UA: NONE SEEN /hpf (ref 0–?)

## 2016-04-30 LAB — URINALYSIS, COMPLETE
Bilirubin, UA: NEGATIVE
Ketones, UA: NEGATIVE
LEUKOCYTES UA: NEGATIVE
Nitrite, UA: NEGATIVE
Specific Gravity, UA: 1.025 (ref 1.005–1.030)
Urobilinogen, Ur: 1 mg/dL (ref 0.2–1.0)
pH, UA: 6.5 (ref 5.0–7.5)

## 2016-05-01 LAB — THYROID PANEL WITH TSH
Free Thyroxine Index: 3.3 (ref 1.2–4.9)
T3 UPTAKE RATIO: 35 % (ref 24–39)
T4 TOTAL: 9.4 ug/dL (ref 4.5–12.0)
TSH: 1.72 u[IU]/mL (ref 0.450–4.500)

## 2016-05-02 LAB — URINE CULTURE: ORGANISM ID, BACTERIA: NO GROWTH

## 2016-05-20 ENCOUNTER — Encounter (HOSPITAL_COMMUNITY): Payer: Self-pay | Admitting: Emergency Medicine

## 2016-05-20 ENCOUNTER — Emergency Department (HOSPITAL_COMMUNITY): Payer: Medicare Other

## 2016-05-20 ENCOUNTER — Inpatient Hospital Stay (HOSPITAL_COMMUNITY)
Admission: EM | Admit: 2016-05-20 | Discharge: 2016-06-03 | DRG: 193 | Disposition: A | Discharge status: Hospice | Payer: Medicare Other | Attending: Family Medicine | Admitting: Family Medicine

## 2016-05-20 DIAGNOSIS — J849 Interstitial pulmonary disease, unspecified: Secondary | ICD-10-CM

## 2016-05-20 DIAGNOSIS — K869 Disease of pancreas, unspecified: Secondary | ICD-10-CM | POA: Diagnosis present

## 2016-05-20 DIAGNOSIS — Z823 Family history of stroke: Secondary | ICD-10-CM | POA: Diagnosis not present

## 2016-05-20 DIAGNOSIS — I480 Paroxysmal atrial fibrillation: Secondary | ICD-10-CM | POA: Diagnosis not present

## 2016-05-20 DIAGNOSIS — Z66 Do not resuscitate: Secondary | ICD-10-CM | POA: Diagnosis present

## 2016-05-20 DIAGNOSIS — Z7952 Long term (current) use of systemic steroids: Secondary | ICD-10-CM

## 2016-05-20 DIAGNOSIS — K5900 Constipation, unspecified: Secondary | ICD-10-CM | POA: Diagnosis not present

## 2016-05-20 DIAGNOSIS — K225 Diverticulum of esophagus, acquired: Secondary | ICD-10-CM | POA: Diagnosis present

## 2016-05-20 DIAGNOSIS — N138 Other obstructive and reflux uropathy: Secondary | ICD-10-CM | POA: Diagnosis present

## 2016-05-20 DIAGNOSIS — I4891 Unspecified atrial fibrillation: Secondary | ICD-10-CM | POA: Diagnosis not present

## 2016-05-20 DIAGNOSIS — R0600 Dyspnea, unspecified: Secondary | ICD-10-CM | POA: Diagnosis not present

## 2016-05-20 DIAGNOSIS — J9620 Acute and chronic respiratory failure, unspecified whether with hypoxia or hypercapnia: Secondary | ICD-10-CM | POA: Diagnosis present

## 2016-05-20 DIAGNOSIS — J441 Chronic obstructive pulmonary disease with (acute) exacerbation: Secondary | ICD-10-CM | POA: Diagnosis present

## 2016-05-20 DIAGNOSIS — Z7982 Long term (current) use of aspirin: Secondary | ICD-10-CM | POA: Diagnosis not present

## 2016-05-20 DIAGNOSIS — J9621 Acute and chronic respiratory failure with hypoxia: Secondary | ICD-10-CM | POA: Diagnosis present

## 2016-05-20 DIAGNOSIS — Z87891 Personal history of nicotine dependence: Secondary | ICD-10-CM

## 2016-05-20 DIAGNOSIS — R0602 Shortness of breath: Secondary | ICD-10-CM | POA: Diagnosis not present

## 2016-05-20 DIAGNOSIS — N139 Obstructive and reflux uropathy, unspecified: Secondary | ICD-10-CM | POA: Diagnosis present

## 2016-05-20 DIAGNOSIS — I5031 Acute diastolic (congestive) heart failure: Secondary | ICD-10-CM | POA: Diagnosis present

## 2016-05-20 DIAGNOSIS — Z885 Allergy status to narcotic agent status: Secondary | ICD-10-CM

## 2016-05-20 DIAGNOSIS — Z8249 Family history of ischemic heart disease and other diseases of the circulatory system: Secondary | ICD-10-CM

## 2016-05-20 DIAGNOSIS — Z7189 Other specified counseling: Secondary | ICD-10-CM

## 2016-05-20 DIAGNOSIS — E038 Other specified hypothyroidism: Secondary | ICD-10-CM

## 2016-05-20 DIAGNOSIS — K219 Gastro-esophageal reflux disease without esophagitis: Secondary | ICD-10-CM | POA: Diagnosis present

## 2016-05-20 DIAGNOSIS — E785 Hyperlipidemia, unspecified: Secondary | ICD-10-CM | POA: Diagnosis present

## 2016-05-20 DIAGNOSIS — R739 Hyperglycemia, unspecified: Secondary | ICD-10-CM | POA: Diagnosis present

## 2016-05-20 DIAGNOSIS — T380X5A Adverse effect of glucocorticoids and synthetic analogues, initial encounter: Secondary | ICD-10-CM | POA: Diagnosis present

## 2016-05-20 DIAGNOSIS — E559 Vitamin D deficiency, unspecified: Secondary | ICD-10-CM | POA: Diagnosis present

## 2016-05-20 DIAGNOSIS — J440 Chronic obstructive pulmonary disease with acute lower respiratory infection: Secondary | ICD-10-CM | POA: Diagnosis present

## 2016-05-20 DIAGNOSIS — I2510 Atherosclerotic heart disease of native coronary artery without angina pectoris: Secondary | ICD-10-CM | POA: Diagnosis present

## 2016-05-20 DIAGNOSIS — N400 Enlarged prostate without lower urinary tract symptoms: Secondary | ICD-10-CM | POA: Diagnosis present

## 2016-05-20 DIAGNOSIS — E876 Hypokalemia: Secondary | ICD-10-CM | POA: Diagnosis not present

## 2016-05-20 DIAGNOSIS — R609 Edema, unspecified: Secondary | ICD-10-CM

## 2016-05-20 DIAGNOSIS — E039 Hypothyroidism, unspecified: Secondary | ICD-10-CM | POA: Diagnosis present

## 2016-05-20 DIAGNOSIS — Z881 Allergy status to other antibiotic agents status: Secondary | ICD-10-CM

## 2016-05-20 DIAGNOSIS — J189 Pneumonia, unspecified organism: Principal | ICD-10-CM | POA: Diagnosis present

## 2016-05-20 DIAGNOSIS — K8689 Other specified diseases of pancreas: Secondary | ICD-10-CM

## 2016-05-20 DIAGNOSIS — E1165 Type 2 diabetes mellitus with hyperglycemia: Secondary | ICD-10-CM | POA: Diagnosis present

## 2016-05-20 DIAGNOSIS — T50905A Adverse effect of unspecified drugs, medicaments and biological substances, initial encounter: Secondary | ICD-10-CM | POA: Diagnosis present

## 2016-05-20 DIAGNOSIS — Z9981 Dependence on supplemental oxygen: Secondary | ICD-10-CM | POA: Diagnosis not present

## 2016-05-20 DIAGNOSIS — Z79899 Other long term (current) drug therapy: Secondary | ICD-10-CM | POA: Diagnosis not present

## 2016-05-20 DIAGNOSIS — Z515 Encounter for palliative care: Secondary | ICD-10-CM | POA: Diagnosis present

## 2016-05-20 DIAGNOSIS — J8410 Pulmonary fibrosis, unspecified: Secondary | ICD-10-CM | POA: Diagnosis present

## 2016-05-20 DIAGNOSIS — R0603 Acute respiratory distress: Secondary | ICD-10-CM

## 2016-05-20 DIAGNOSIS — N401 Enlarged prostate with lower urinary tract symptoms: Secondary | ICD-10-CM | POA: Diagnosis present

## 2016-05-20 LAB — CBC WITH DIFFERENTIAL/PLATELET
BASOS PCT: 0 %
Basophils Absolute: 0 10*3/uL (ref 0.0–0.1)
Eosinophils Absolute: 0 10*3/uL (ref 0.0–0.7)
Eosinophils Relative: 0 %
HEMATOCRIT: 36.8 % — AB (ref 39.0–52.0)
Hemoglobin: 11.8 g/dL — ABNORMAL LOW (ref 13.0–17.0)
Lymphocytes Relative: 12 %
Lymphs Abs: 1.1 10*3/uL (ref 0.7–4.0)
MCH: 30.6 pg (ref 26.0–34.0)
MCHC: 32.1 g/dL (ref 30.0–36.0)
MCV: 95.3 fL (ref 78.0–100.0)
MONO ABS: 0.6 10*3/uL (ref 0.1–1.0)
MONOS PCT: 6 %
NEUTROS ABS: 7.5 10*3/uL (ref 1.7–7.7)
Neutrophils Relative %: 82 %
Platelets: 184 10*3/uL (ref 150–400)
RBC: 3.86 MIL/uL — ABNORMAL LOW (ref 4.22–5.81)
RDW: 13.6 % (ref 11.5–15.5)
WBC: 9.2 10*3/uL (ref 4.0–10.5)

## 2016-05-20 LAB — I-STAT CHEM 8, ED
BUN: 13 mg/dL (ref 6–20)
CALCIUM ION: 1.08 mmol/L — AB (ref 1.15–1.40)
Chloride: 93 mmol/L — ABNORMAL LOW (ref 101–111)
Creatinine, Ser: 0.6 mg/dL — ABNORMAL LOW (ref 0.61–1.24)
GLUCOSE: 232 mg/dL — AB (ref 65–99)
HCT: 36 % — ABNORMAL LOW (ref 39.0–52.0)
Hemoglobin: 12.2 g/dL — ABNORMAL LOW (ref 13.0–17.0)
Potassium: 3.5 mmol/L (ref 3.5–5.1)
SODIUM: 134 mmol/L — AB (ref 135–145)
TCO2: 31 mmol/L (ref 0–100)

## 2016-05-20 LAB — COMPREHENSIVE METABOLIC PANEL
ALT: 22 U/L (ref 17–63)
ANION GAP: 7 (ref 5–15)
AST: 23 U/L (ref 15–41)
Albumin: 3.4 g/dL — ABNORMAL LOW (ref 3.5–5.0)
Alkaline Phosphatase: 46 U/L (ref 38–126)
BILIRUBIN TOTAL: 0.9 mg/dL (ref 0.3–1.2)
BUN: 14 mg/dL (ref 6–20)
CO2: 32 mmol/L (ref 22–32)
Calcium: 8.9 mg/dL (ref 8.9–10.3)
Chloride: 95 mmol/L — ABNORMAL LOW (ref 101–111)
Creatinine, Ser: 0.74 mg/dL (ref 0.61–1.24)
GFR calc Af Amer: >60 mL/min (ref >60)
Glucose, Bld: 278 mg/dL — ABNORMAL HIGH (ref 65–99)
POTASSIUM: 4.2 mmol/L (ref 3.5–5.1)
Sodium: 134 mmol/L — ABNORMAL LOW (ref 135–145)
TOTAL PROTEIN: 6.2 g/dL — AB (ref 6.5–8.1)

## 2016-05-20 LAB — URINALYSIS, ROUTINE W REFLEX MICROSCOPIC
BILIRUBIN URINE: NEGATIVE
Bacteria, UA: NONE SEEN
Glucose, UA: >=500 mg/dL — AB
HGB URINE DIPSTICK: NEGATIVE
Ketones, ur: NEGATIVE mg/dL
Leukocytes, UA: NEGATIVE
Nitrite: NEGATIVE
PROTEIN: NEGATIVE mg/dL
SPECIFIC GRAVITY, URINE: 1.02 (ref 1.005–1.030)
pH: 6 (ref 5.0–8.0)

## 2016-05-20 LAB — MRSA PCR SCREENING: MRSA BY PCR: NEGATIVE

## 2016-05-20 LAB — GLUCOSE, CAPILLARY
GLUCOSE-CAPILLARY: 204 mg/dL — AB (ref 65–99)
Glucose-Capillary: 336 mg/dL — ABNORMAL HIGH (ref 65–99)

## 2016-05-20 LAB — CG4 I-STAT (LACTIC ACID): Lactic Acid, Venous: 2.67 mmol/L (ref 0.5–1.9)

## 2016-05-20 LAB — I-STAT CG4 LACTIC ACID, ED: Lactic Acid, Venous: 3.1 mmol/L (ref 0.5–1.9)

## 2016-05-20 LAB — TROPONIN I

## 2016-05-20 LAB — BRAIN NATRIURETIC PEPTIDE: B Natriuretic Peptide: 36 pg/mL (ref 0.0–100.0)

## 2016-05-20 MED ORDER — ONDANSETRON HCL 4 MG/2ML IJ SOLN
4.0000 mg | Freq: Four times a day (QID) | INTRAMUSCULAR | Status: DC | PRN
  Administered 2016-06-03: 4 mg via INTRAVENOUS
  Filled 2016-05-20: qty 2

## 2016-05-20 MED ORDER — CEFTRIAXONE SODIUM 1 G IJ SOLR
1.0000 g | Freq: Once | INTRAMUSCULAR | Status: AC
  Administered 2016-05-20: 1 g via INTRAVENOUS
  Filled 2016-05-20: qty 10

## 2016-05-20 MED ORDER — ATORVASTATIN CALCIUM 10 MG PO TABS
10.0000 mg | ORAL_TABLET | Freq: Every day | ORAL | Status: DC
  Administered 2016-05-20 – 2016-06-03 (×15): 10 mg via ORAL
  Filled 2016-05-20 (×15): qty 1

## 2016-05-20 MED ORDER — CEFTRIAXONE SODIUM 1 G IJ SOLR
1.0000 g | INTRAMUSCULAR | Status: DC
  Administered 2016-05-21 – 2016-05-29 (×9): 1 g via INTRAVENOUS
  Filled 2016-05-20 (×11): qty 10

## 2016-05-20 MED ORDER — SODIUM CHLORIDE 0.9 % IV SOLN
INTRAVENOUS | Status: DC
  Administered 2016-05-20 – 2016-05-22 (×3): via INTRAVENOUS

## 2016-05-20 MED ORDER — ENOXAPARIN SODIUM 40 MG/0.4ML ~~LOC~~ SOLN
40.0000 mg | SUBCUTANEOUS | Status: DC
  Administered 2016-05-20 – 2016-05-30 (×11): 40 mg via SUBCUTANEOUS
  Filled 2016-05-20 (×11): qty 0.4

## 2016-05-20 MED ORDER — ALBUTEROL SULFATE (2.5 MG/3ML) 0.083% IN NEBU: 2.5000 mg | INHALATION_SOLUTION | RESPIRATORY_TRACT | Status: DC | PRN

## 2016-05-20 MED ORDER — DEXTROSE 5 % IV SOLN
500.0000 mg | Freq: Once | INTRAVENOUS | Status: AC
  Administered 2016-05-20: 500 mg via INTRAVENOUS
  Filled 2016-05-20: qty 500

## 2016-05-20 MED ORDER — ACETAMINOPHEN 650 MG RE SUPP: 650.0000 mg | Freq: Four times a day (QID) | RECTAL | Status: DC | PRN

## 2016-05-20 MED ORDER — ALBUTEROL (5 MG/ML) CONTINUOUS INHALATION SOLN
10.0000 mg/h | INHALATION_SOLUTION | RESPIRATORY_TRACT | Status: AC
  Administered 2016-05-20: 10 mg/h via RESPIRATORY_TRACT
  Filled 2016-05-20: qty 20

## 2016-05-20 MED ORDER — METHYLPREDNISOLONE SODIUM SUCC 125 MG IJ SOLR
125.0000 mg | Freq: Once | INTRAMUSCULAR | Status: AC
  Administered 2016-05-20: 125 mg via INTRAVENOUS
  Filled 2016-05-20: qty 2

## 2016-05-20 MED ORDER — LEVOTHYROXINE SODIUM 25 MCG PO TABS
125.0000 ug | ORAL_TABLET | Freq: Every day | ORAL | Status: DC
  Administered 2016-05-21 – 2016-06-03 (×14): 125 ug via ORAL
  Filled 2016-05-20 (×14): qty 1

## 2016-05-20 MED ORDER — ONDANSETRON HCL 4 MG PO TABS: 4.0000 mg | ORAL_TABLET | Freq: Four times a day (QID) | ORAL | Status: DC | PRN

## 2016-05-20 MED ORDER — GUAIFENESIN ER 600 MG PO TB12
1200.0000 mg | ORAL_TABLET | Freq: Two times a day (BID) | ORAL | Status: DC
  Administered 2016-05-20 – 2016-06-03 (×29): 1200 mg via ORAL
  Filled 2016-05-20 (×30): qty 2

## 2016-05-20 MED ORDER — MYCOPHENOLATE MOFETIL 500 MG PO TABS: 1000.0000 mg | ORAL_TABLET | Freq: Two times a day (BID) | ORAL | Status: DC

## 2016-05-20 MED ORDER — INSULIN ASPART 100 UNIT/ML ~~LOC~~ SOLN
0.0000 [IU] | Freq: Three times a day (TID) | SUBCUTANEOUS | Status: DC
  Administered 2016-05-20 – 2016-05-21 (×2): 5 [IU] via SUBCUTANEOUS

## 2016-05-20 MED ORDER — CITALOPRAM HYDROBROMIDE 20 MG PO TABS
20.0000 mg | ORAL_TABLET | Freq: Every day | ORAL | Status: DC
  Administered 2016-05-20 – 2016-06-03 (×15): 20 mg via ORAL
  Filled 2016-05-20 (×15): qty 1

## 2016-05-20 MED ORDER — IPRATROPIUM-ALBUTEROL 0.5-2.5 (3) MG/3ML IN SOLN
3.0000 mL | RESPIRATORY_TRACT | Status: DC
  Administered 2016-05-20 – 2016-05-23 (×16): 3 mL via RESPIRATORY_TRACT
  Filled 2016-05-20 (×17): qty 3

## 2016-05-20 MED ORDER — INSULIN ASPART 100 UNIT/ML ~~LOC~~ SOLN
0.0000 [IU] | Freq: Every day | SUBCUTANEOUS | Status: DC
  Administered 2016-05-20: 4 [IU] via SUBCUTANEOUS

## 2016-05-20 MED ORDER — ACETAMINOPHEN 325 MG PO TABS: 650.0000 mg | ORAL_TABLET | Freq: Four times a day (QID) | ORAL | Status: DC | PRN

## 2016-05-20 MED ORDER — TERAZOSIN HCL 5 MG PO CAPS
10.0000 mg | ORAL_CAPSULE | Freq: Every day | ORAL | Status: DC
Start: 2016-05-20 — End: 2016-06-03
  Administered 2016-05-20 – 2016-06-02 (×14): 10 mg via ORAL
  Filled 2016-05-20 (×14): qty 2

## 2016-05-20 MED ORDER — MYCOPHENOLATE MOFETIL 250 MG PO CAPS
1000.0000 mg | ORAL_CAPSULE | Freq: Two times a day (BID) | ORAL | Status: DC
  Administered 2016-05-20 – 2016-06-03 (×29): 1000 mg via ORAL
  Filled 2016-05-20 (×31): qty 4

## 2016-05-20 MED ORDER — LEVOTHYROXINE SODIUM 25 MCG PO TABS
12.5000 ug | ORAL_TABLET | ORAL | Status: DC
  Administered 2016-05-21 – 2016-06-02 (×6): 12.5 ug via ORAL
  Filled 2016-05-20 (×5): qty 1

## 2016-05-20 MED ORDER — PANTOPRAZOLE SODIUM 40 MG PO TBEC
40.0000 mg | DELAYED_RELEASE_TABLET | Freq: Every day | ORAL | Status: DC
  Administered 2016-05-20 – 2016-06-03 (×15): 40 mg via ORAL
  Filled 2016-05-20 (×15): qty 1

## 2016-05-20 MED ORDER — DEXTROSE 5 % IV SOLN
500.0000 mg | INTRAVENOUS | Status: DC
  Administered 2016-05-21 – 2016-05-23 (×3): 500 mg via INTRAVENOUS
  Filled 2016-05-20 (×4): qty 500

## 2016-05-20 MED ORDER — ASPIRIN EC 325 MG PO TBEC
325.0000 mg | DELAYED_RELEASE_TABLET | Freq: Every day | ORAL | Status: DC
  Administered 2016-05-20 – 2016-06-03 (×15): 325 mg via ORAL
  Filled 2016-05-20 (×15): qty 1

## 2016-05-20 MED ORDER — METHYLPREDNISOLONE SODIUM SUCC 125 MG IJ SOLR
60.0000 mg | Freq: Four times a day (QID) | INTRAMUSCULAR | Status: DC
  Administered 2016-05-20 – 2016-06-03 (×56): 60 mg via INTRAVENOUS
  Filled 2016-05-20 (×56): qty 2

## 2016-05-20 NOTE — ED Provider Notes (Signed)
[facility]-EMER[facility]ENCY DEPT Provider Note   CSN: [id number] Arrival date & time: 05/20/16  [id number]  By signing my name below, I, Raymond [name], attest that this documentation has been prepared under the direction and in the presence of Eber HongBrian Bryley Chrisman, MD. Electronically Signed: Cynda AcresHailei [name], Scribe. 05/20/16. 9:32 AM.  History   Chief Complaint Chief Complaint  Patient presents with  . Shortness of Breath    HPI Comments: Raymond SkainsWilliam H [name] is a 83 y.o. male with a hx of interstitial lung disease, COPD on 4 liters of home oxygen, who presents to the Emergency Department complaining of sudden-onset, gradually worsening shortness of breath that began 2 weeks ago. Patient states he has increased shortness of breath on exertion. Patient has associated chest tightness, cough productive of yellow sputum, and left-sided lower extremity swelling. Patient is currently on 15 mg of prednisone daily. Per wife, the patients breathing has gradually worsened in the past week, more trouble breathing even at rest. Patient reports using more oxygen than usual. Patient denies any fever or any other symptoms. Patient currently on 3 liters of oxygen.   The history is provided by the patient. No language interpreter was used.    Past Medical History:  Diagnosis Date  . Achilles tendon rupture   . Arthritis   . BPH (benign prostatic hyperplasia)   . CAD (coronary artery disease)   . Cataracts, bilateral   . COPD (chronic obstructive pulmonary disease) (HCC)   . GERD (gastroesophageal reflux disease)   . Hyperlipemia   . Hypothyroid   . ILD (interstitial lung disease) (H[facility])   . Prostate hyperplasia, benign localized, with urinary obstruction     Patient Active Problem List   Diagnosis Date Noted  . Acute on chronic respiratory failure ([facility]) 05/20/2016  . COPD with exacerbation ([facility]) 07/24/2015  . Hyperglycemia, drug-induced 07/23/2015  . Elevated LFTs 07/23/2015  . CAP (community acquired pneumonia)  07/21/2015  . Acute on chronic respiratory failure with hypoxia ([facility]) 07/21/2015  . Pulmonary fibrosis ([facility]) 07/21/2015  . Leukocytosis 07/21/2015  . Vitamin D deficiency 02/19/2013  . BPH (benign prostatic hyperplasia) 09/04/2012  . Allergic rhinitis 09/24/2010  .  Cough, chronic 06/26/2010  . PULMONARY FIBROSIS ILD POST INFLAMMATORY CHRONIC 12/30/2009  . Hypothyroidism 12/29/2009  . Hyperlipidemia, acquired 12/29/2009  . Coronary atherosclerosis 12/29/2009    Past Surgical History:  Procedure Laterality Date  . ACHILLES TENDON SURGERY  08/20/2011   Procedure: ACHILLES TENDON REPAIR;  Surgeon: Sherri RadPaul A Bednarz, MD;  Location: [facility];  Service: Orthopedics;  Laterality: Right;  Right primary achilles tendon repair, gastroc slide  . APPENDECTOMY    . CHOLECYSTECTOMY    . COLONOSCOPY    . EYE SURGERY     cataracts       Home Medications    Prior to Admission medications   Medication Sig Start Date End Date Taking? Authorizing Provider  albuterol (PROVENTIL) (2.5 MG/3ML) 0.083% nebulizer solution Take 3 mLs (2.5 mg total) by nebulization every 6 (six) hours as needed for wheezing or shortness of breath. 07/25/15  Yes Elliot Cousinenise Fisher, MD  amoxicillin-clavulanate (AUGMENTIN) 875-125 MG tablet Take 1 tablet by mouth 2 (two) times daily. 03/31/16  Yes Ernestina Pennaonald W Moore, MD  aspirin EC 325 MG tablet Take 325 mg by mouth daily.   Yes Historical Provider, MD  atorvastatin (LIPITOR) 10 MG tablet Take 1 tablet (10 mg total) by mouth daily. 11/26/15  Yes Ernestina Pennaonald W Moore, MD  Cholecalciferol (VITAMIN D) 2000 UNITS CAPS Take 2,000-4,000 Units  by mouth daily. Takes 2000 units everyday except for Fri, Sat, and Sun patient takes 4000 units.   Yes Historical Provider, MD  citalopram (CELEXA) 20 MG tablet Take 1 tablet (20 mg total) by mouth daily. 06/26/15 06/25/16 Yes Ernestina Penna, MD  levothyroxine (SYNTHROID, LEVOTHROID) 125 MCG tablet Take 1 tablet (125 mcg total) by mouth daily. 07/25/15   Yes Elliot Cousin, MD  levothyroxine (SYNTHROID, LEVOTHROID) 25 MCG tablet Take 12.5 mcg by mouth. Take Monday, Wednesday and Friday with the   Yes Historical Provider, MD  mycophenolate (CELLCEPT) 500 MG tablet Take 2 tablets by mouth 2 (two) times daily. 07/17/13  Yes Historical Provider, MD  omeprazole (PRILOSEC) 20 MG capsule Take 1 capsule (20 mg total) by mouth every evening. 06/26/15  Yes Ernestina Penna, MD  predniSONE (DELTASONE) 10 MG tablet Take 15 mg by mouth.  12/11/15  Yes Historical Provider, MD  terazosin (HYTRIN) 10 MG capsule Take 1 capsule (10 mg total) by mouth at bedtime. 06/26/15  Yes Ernestina Penna, MD    Family History Family History  Problem Relation Age of Onset  . Heart disease Mother 13    rheumatic heart disease  . Stroke Father   . Hypertension Father   . Cancer Sister     breast  . Hypertension Brother   . Hypertension Brother   . Hypertension Brother     Social History Social History  Substance Use Topics  . Smoking status: Former Smoker    Packs/day: 2.00    Years: 30.00    Types: Cigarettes    Quit date: 03/29/1980  . Smokeless tobacco: Never Used  . Alcohol use No     Allergies   Codeine and Levaquin [levofloxacin in d5w]   Review of Systems Review of Systems  Constitutional: Negative for fever.  Respiratory: Positive for cough, chest tightness and shortness of breath.   All other systems reviewed and are negative.    Physical Exam Updated Vital Signs BP 123/64   Pulse 85   Temp 97.8 F (36.6 C) (Oral)   Resp (!) 38   Ht 5\' 6"  (1.676 m)   Wt 165 lb (74.8 kg)   SpO2 100%   BMI 26.63 kg/m   Physical Exam  Constitutional: He is oriented to person, place, and time. He appears well-developed.  HENT:  Head: Normocephalic and atraumatic.  Mouth/Throat: Oropharynx is clear and moist.  Eyes: Conjunctivae and EOM are normal. Pupils are equal, round, and reactive to light.  Neck: Normal range of motion. Neck supple.    Cardiovascular: Normal rate and regular rhythm.  Exam reveals no gallop and no friction rub.   No murmur heard. No JVD, no edema.   Pulmonary/Chest: Effort normal and breath sounds normal. He has no wheezes. He has no rales.  Wheezing and rales bilaterally, posteriorly.Purst lip breathing with significant increased WOB with accessory muscle use.   Abdominal: Soft. Bowel sounds are normal.  Musculoskeletal: Normal range of motion.  Neurological: He is alert and oriented to person, place, and time.  Skin: Skin is warm and dry.  Psychiatric: He has a normal mood and affect.     ED Treatments / Results  DIAGNOSTIC STUDIES: Oxygen Saturation is 96% on RA, low by my interpretation.    COORDINATION OF CARE: 9:30 AM Discussed treatment plan with pt at bedside and pt agreed to plan, which includes a breathing treatment.   Labs (all labs ordered are listed, but only abnormal results are displayed) Labs  Reviewed  CBC WITH DIFFERENTIAL/PLATELET - Abnormal; Notable for the following:       Result Value   RBC 3.86 (*)    Hemoglobin 11.8 (*)    HCT 36.8 (*)    All other components within normal limits  COMPREHENSIVE METABOLIC PANEL - Abnormal; Notable for the following:    Sodium 134 (*)    Chloride 95 (*)    Glucose, Bld 278 (*)    Total Protein 6.2 (*)    Albumin 3.4 (*)    All other components within normal limits  URINALYSIS, ROUTINE W REFLEX MICROSCOPIC - Abnormal; Notable for the following:    Glucose, UA >=500 (*)    All other components within normal limits  I-STAT CG4 LACTIC ACID, ED - Abnormal; Notable for the following:    Lactic Acid, Venous 3.10 (*)    All other components within normal limits  CULTURE, BLOOD (ROUTINE X 2)  CULTURE, BLOOD (ROUTINE X 2)  BRAIN NATRIURETIC PEPTIDE  TROPONIN I  I-STAT CG4 LACTIC ACID, ED    EKG  EKG Interpretation  Date/Time:  Thursday May 20 2016 09:17:35 EST Ventricular Rate:  68 PR Interval:    QRS Duration: 90 QT  Interval:  391 QTC Calculation: 416 R Axis:   -30 Text Interpretation:  Sinus rhythm Inferior infarct, old Consider anterior infarct since last tracing no significant change Poor R wave progression Confirmed by Hyacinth Meeker  MD, Laakea Pereira ([id number]) on 05/20/2016 10:05:53 AM       Radiology Dg Chest 2 View  Result Date: 05/20/2016 CLINICAL DATA:  Shortness of breath. EXAM: CHEST  2 VIEW COMPARISON:  CT 07/21/2015 .  Chest x-ray 07/21/2015, 03/27/2012. FINDINGS: Cardiomegaly with normal pulmonary vascularity again noted. Diffuse interstitial changes noted consistent with chronic interstitial lung disease. Degenerative changes thoracic spine. IMPRESSION: 1.  Stable cardiomegaly. 2. Chronic interstitial changes consistent with chronic interstitial lung disease. Superimposed active interstitial process cannot be excluded. Electronically Signed   By: Maisie Fus  Register   On: 05/20/2016 10:18    Procedures Procedures (including critical care time)  Medications Ordered in ED Medications  albuterol (PROVENTIL,VENTOLIN) solution continuous neb (10 mg/hr Nebulization New Bag/Given 05/20/16 1029)  cefTRIAXone (ROCEPHIN) 1 g in dextrose 5 % 50 mL IVPB (0 g Intravenous Stopped 05/20/16 1053)  azithromycin (ZITHROMAX) 500 mg in dextrose 5 % 250 mL IVPB (0 mg Intravenous Stopped 05/20/16 1201)  methylPREDNISolone sodium succinate (SOLU-MEDROL) 125 mg/2 mL injection 125 mg (125 mg Intravenous Given 05/20/16 1222)     Initial Impression / Assessment and Plan / ED Course  I have reviewed the triage vital signs and the nursing notes.  Pertinent labs & imaging results that were available during my care of the patient were reviewed by me and considered in my medical decision making (see chart for details).     The patient has had ongoing severe shortness of breath, he accidentally came off of his oxygen for a few seconds after urinating at the bedside and developed severe hypoxia down to 55%. He had a near syncopal episode,  oxygen was replaced, his symptoms improved but he remained dyspneic. Continuous nebulizer therapy given, chest x-ray reveals interstitial disease but no obvious edema or infiltrates, no significant leukocytosis however his lactic acid was elevated at 3.1. He was given antibiotics, he does not have a fever, he is not hypotensive, he is not hypoxic on his baseline oxygen, his tachypnea is chronic, I don't necessarily think that the patient has sepsis and his lactic acid I don't  think is related to underlying infection but more related to his increased respiratory distress and relative hypoxia from his chronic progressive lung condition. He will be admitted to the hospital.  D/w Dr. Kerry Hough who is in agreement.  Vitals:   05/20/16 1000 05/20/16 1029 05/20/16 1045 05/20/16 1145  BP: 123/64     Pulse: 79  80 85  Resp: 26  (!) 31 (!) 38  Temp:      TempSrc:      SpO2: 100% 100% 100% 100%  Weight:      Height:         Final Clinical Impressions(s) / ED Diagnoses   Final diagnoses:  Respiratory distress  Interstitial lung disease (HCC)    New Prescriptions New Prescriptions   No medications on file   I personally performed the services described in this documentation, which was scribed in my presence. The recorded information has been reviewed and is accurate.        Eber Hong, MD 05/20/16 [phone]

## 2016-05-20 NOTE — Progress Notes (Signed)
Pharmacy Antibiotic Note  Raymond SkainsWilliam H [name] is a 83 y.o. male admitted on 05/20/2016 with CAP.  Pharmacy has been consulted for Ceftriaxone and Azithromycin dosing.  Plan: Ceftriaxone 1gm IV q24h Azithromycin 500mg  IV q24h No need for further dosage adjustment appears unlikely at present. Will sign off at this time.  Please reconsult if a change in clinical status warrants re-evaluation of dosage  Height: 5\' 6"  (167.6 cm) Weight: 165 lb 12.6 oz (75.2 kg) IBW/kg (Calculated) : 63.8  Temp (24hrs), Avg:98 F (36.7 C), Min:97.8 F (36.6 C), Max:98.2 F (36.8 C)   Recent Labs Lab 05/20/16 0920 05/20/16 0957 05/20/16 1313 05/20/16 1323  WBC 9.2  --   --   --   CREATININE 0.74  --  0.60*  --   LATICACIDVEN  --  3.10*  --  2.67*    Estimated Creatinine Clearance: 60.9 mL/min (by C-G formula based on SCr of 0.6 mg/dL (L)).    Allergies  Allergen Reactions  . Codeine Other (See Comments)    hallucinations  . Levaquin [Levofloxacin In D5w] Other (See Comments)    Tendonitis    Antimicrobials this admission: Ceftriaxone 2/22 >>  Azithromycin 2/22 >>   Dose adjustments this admission: n/a  Microbiology results: 2/22 BCx: pending  Thank you for allowing pharmacy to be a part of this patient's care.  Elder CyphersLorie Kandis Henry, BS Pharm D, [place] Clinical Pharmacist Pager [phone] 05/20/2016 2:11 PM

## 2016-05-20 NOTE — H&P (Signed)
History and Physical    Raymond [name] ZOX:[id number] DOB: 1929/08/02 DOA: 05/20/2016  PCP: Rudi Heap, MD  Patient coming from: Home  Chief Complaint: Shortness of breath  HPI: Raymond [name] is a 83 y.o. male with medical history significant of interstitial lung disease/pulmonary fibrosis, oxygen dependent COPD, hypothyroidism who presents to the hospital with complaints of shortness of breath. Patient reports that for the past several weeks, he's had worsening shortness of breath. He's also had a productive cough for the last week. He denies any fever. No chest pain. No vomiting or diarrhea. At baseline, his functional status appears to be very poor. He is unable to walk around his house without getting short of breath. He is normally on 4 L of oxygen chronically. He's been taking nebulizer treatments at home without significant benefit. He is followed at [facility] for pulmonary fibrosis and has been on mycophenolate for the past 4 years. He is also chronically on prednisone.  ED Course: In the ED, he was noted to be significantly short of breath and dyspneic. He became very hypoxic when trying to use a bathroom. Chest x-ray indicated chronic changes, but could not rule out superimposed pneumonia. Since patient is particularly short of breath, he'll be admitted for further treatments.  Review of Systems: As per HPI otherwise 10 point review of systems negative.    Past Medical History:  Diagnosis Date  . Achilles tendon rupture   . Arthritis   . BPH (benign prostatic hyperplasia)   . CAD (coronary artery disease)   . Cataracts, bilateral   . COPD (chronic obstructive pulmonary disease) (HCC)   . GERD (gastroesophageal reflux disease)   . Hyperlipemia   . Hypothyroid   . ILD (interstitial lung disease) (HCC)   . Prostate hyperplasia, benign localized, with urinary obstruction     Past Surgical History:  Procedure Laterality Date  . ACHILLES TENDON SURGERY   08/20/2011   Procedure: ACHILLES TENDON REPAIR;  Surgeon: Sherri Rad, MD;  Location: [facility];  Service: Orthopedics;  Laterality: Right;  Right primary achilles tendon repair, gastroc slide  . APPENDECTOMY    . CHOLECYSTECTOMY    . COLONOSCOPY    . EYE SURGERY     cataracts     reports that he quit smoking about 36 years ago. His smoking use included Cigarettes. He has a 60.00 pack-year smoking history. He has never used smokeless tobacco. He reports that he does not drink alcohol or use drugs.  Allergies  Allergen Reactions  . Codeine Other (See Comments)    hallucinations  . Levaquin [Levofloxacin In D5w] Other (See Comments)    Tendonitis    Family History  Problem Relation Age of Onset  . Heart disease Mother 61    rheumatic heart disease  . Stroke Father   . Hypertension Father   . Cancer Sister     breast  . Hypertension Brother   . Hypertension Brother   . Hypertension Brother      Prior to Admission medications   Medication Sig Start Date End Date Taking? Authorizing Provider  albuterol (PROVENTIL) (2.5 MG/3ML) 0.083% nebulizer solution Take 3 mLs (2.5 mg total) by nebulization every 6 (six) hours as needed for wheezing or shortness of breath. 07/25/15  Yes Elliot Cousin, MD  amoxicillin-clavulanate (AUGMENTIN) 875-125 MG tablet Take 1 tablet by mouth 2 (two) times daily. 03/31/16  Yes Ernestina Penna, MD  aspirin EC 325 MG tablet Take 325 mg by mouth daily.  Yes Historical Provider, MD  atorvastatin (LIPITOR) 10 MG tablet Take 1 tablet (10 mg total) by mouth daily. 11/26/15  Yes Ernestina Penna, MD  Cholecalciferol (VITAMIN D) 2000 UNITS CAPS Take 2,000-4,000 Units by mouth daily. Takes 2000 units everyday except for Fri, Sat, and Sun patient takes 4000 units.   Yes Historical Provider, MD  citalopram (CELEXA) 20 MG tablet Take 1 tablet (20 mg total) by mouth daily. 06/26/15 06/25/16 Yes Ernestina Penna, MD  levothyroxine (SYNTHROID, LEVOTHROID) 125 MCG  tablet Take 1 tablet (125 mcg total) by mouth daily. 07/25/15  Yes Elliot Cousin, MD  levothyroxine (SYNTHROID, LEVOTHROID) 25 MCG tablet Take 12.5 mcg by mouth. Take Monday, Wednesday and Friday with the   Yes Historical Provider, MD  mycophenolate (CELLCEPT) 500 MG tablet Take 2 tablets by mouth 2 (two) times daily. 07/17/13  Yes Historical Provider, MD  omeprazole (PRILOSEC) 20 MG capsule Take 1 capsule (20 mg total) by mouth every evening. 06/26/15  Yes Ernestina Penna, MD  predniSONE (DELTASONE) 10 MG tablet Take 15 mg by mouth.  12/11/15  Yes Historical Provider, MD  terazosin (HYTRIN) 10 MG capsule Take 1 capsule (10 mg total) by mouth at bedtime. 06/26/15  Yes Ernestina Penna, MD    Physical Exam: Vitals:   05/20/16 1300 05/20/16 1310 05/20/16 1345 05/20/16 1400  BP: 116/65 116/65  122/72  Pulse: 81 81  80  Resp: (!) 29 25  (!) 32  Temp:   98.2 F (36.8 C)   TempSrc:   Oral   SpO2: 99% 100%  97%  Weight:   75.2 kg (165 lb 12.6 oz)   Height:   5\' 6"  (1.676 m)       Constitutional: NAD, calm, comfortable Vitals:   05/20/16 1300 05/20/16 1310 05/20/16 1345 05/20/16 1400  BP: 116/65 116/65  122/72  Pulse: 81 81  80  Resp: (!) 29 25  (!) 32  Temp:   98.2 F (36.8 C)   TempSrc:   Oral   SpO2: 99% 100%  97%  Weight:   75.2 kg (165 lb 12.6 oz)   Height:   5\' 6"  (1.676 m)    Eyes: PERRL, lids and conjunctivae normal ENMT: Mucous membranes are moist. Posterior pharynx clear of any exudate or lesions.Normal dentition.  Neck: normal, supple, no masses, no thyromegaly Respiratory: Bilateral wheezes and rhonchi. Diminished breath sounds. Increased respiratory effort..  Cardiovascular: Regular rate and rhythm, no murmurs / rubs / gallops. No extremity edema. 2+ pedal pulses. No carotid bruits.  Abdomen: no tenderness, no masses palpated. No hepatosplenomegaly. Bowel sounds positive.  Musculoskeletal: no clubbing / cyanosis. No joint deformity upper and lower extremities. Good ROM,  no contractures. Normal muscle tone.  Skin: no rashes, lesions, ulcers. No induration Neurologic: CN 2-12 grossly intact. Sensation intact, DTR normal. Strength 5/5 in all 4.  Psychiatric: Normal judgment and insight. Alert and oriented x 3. Normal mood.   Labs on Admission: I have personally reviewed following labs and imaging studies  CBC:  Recent Labs Lab 05/20/16 0920 05/20/16 1313  WBC 9.2  --   NEUTROABS 7.5  --   HGB 11.8* 12.2*  HCT 36.8* 36.0*  MCV 95.3  --   PLT 184  --    Basic Metabolic Panel:  Recent Labs Lab 05/20/16 0920 05/20/16 1313  NA 134* 134*  K 4.2 3.5  CL 95* 93*  CO2 32  --   GLUCOSE 278* 232*  BUN 14 13  CREATININE  0.74 0.60*  CALCIUM 8.9  --    GFR: Estimated Creatinine Clearance: 60.9 mL/min (by C-G formula based on SCr of 0.6 mg/dL (L)). Liver Function Tests:  Recent Labs Lab 05/20/16 0920  AST 23  ALT 22  ALKPHOS 46  BILITOT 0.9  PROT 6.2*  ALBUMIN 3.4*   No results for input(s): LIPASE, AMYLASE in the last 168 hours. No results for input(s): AMMONIA in the last 168 hours. Coagulation Profile: No results for input(s): INR, PROTIME in the last 168 hours. Cardiac Enzymes:  Recent Labs Lab 05/20/16 0920  TROPONINI <0.03   BNP (last 3 results) No results for input(s): PROBNP in the last 8760 hours. HbA1C: No results for input(s): HGBA1C in the last 72 hours. CBG: No results for input(s): GLUCAP in the last 168 hours. Lipid Profile: No results for input(s): CHOL, HDL, LDLCALC, TRIG, CHOLHDL, LDLDIRECT in the last 72 hours. Thyroid Function Tests: No results for input(s): TSH, T4TOTAL, FREET4, T3FREE, THYROIDAB in the last 72 hours. Anemia Panel: No results for input(s): VITAMINB12, FOLATE, FERRITIN, TIBC, IRON, RETICCTPCT in the last 72 hours. Urine analysis:    Component Value Date/Time   COLORURINE YELLOW 05/20/2016 0944   APPEARANCEUR CLEAR 05/20/2016 0944   APPEARANCEUR Clear 04/30/2016 0841   LABSPEC 1.020  05/20/2016 0944   PHURINE 6.0 05/20/2016 0944   GLUCOSEU >=500 (A) 05/20/2016 0944   HGBUR NEGATIVE 05/20/2016 0944   BILIRUBINUR NEGATIVE 05/20/2016 0944   BILIRUBINUR Negative 04/30/2016 0841   KETONESUR NEGATIVE 05/20/2016 0944   PROTEINUR NEGATIVE 05/20/2016 0944   UROBILINOGEN negative 12/19/2014 1006   NITRITE NEGATIVE 05/20/2016 0944   LEUKOCYTESUR NEGATIVE 05/20/2016 0944   LEUKOCYTESUR Negative 04/30/2016 0841    Radiological Exams on Admission: Dg Chest 2 View  Result Date: 05/20/2016 CLINICAL DATA:  Shortness of breath. EXAM: CHEST  2 VIEW COMPARISON:  CT 07/21/2015 .  Chest x-ray 07/21/2015, 03/27/2012. FINDINGS: Cardiomegaly with normal pulmonary vascularity again noted. Diffuse interstitial changes noted consistent with chronic interstitial lung disease. Degenerative changes thoracic spine. IMPRESSION: 1.  Stable cardiomegaly. 2. Chronic interstitial changes consistent with chronic interstitial lung disease. Superimposed active interstitial process cannot be excluded. Electronically Signed   By: Maisie Fus  Register   On: 05/20/2016 10:18    EKG: Independently reviewed. Sinus rhythm without acute changes.  Assessment/Plan Active Problems:   Hypothyroidism   Hyperlipidemia, acquired   PULMONARY FIBROSIS ILD POST INFLAMMATORY CHRONIC   CAP (community acquired pneumonia)   Pulmonary fibrosis (HCC)   Hyperglycemia, drug-induced   COPD with exacerbation (HCC)   Acute on chronic respiratory failure (HCC)     1. Acute on chronic respiratory failure with hypoxia. Suspect this is related to pneumonia as well as COPD/pulmonary fibrosis. Wean oxygen down the baseline requirement of 4 L.  2. Probable pneumonia. Treat with ceftriaxone and azithromycin.  3. COPD exacerbation. Continue on IV steroids, antibiotics and bronchodilators. Continue pulmonary hygiene.  4. Interstitial lung disease/pulmonary fibrosis. Continue mycophenolate and steroids. Will request a pulmonology  consultation to help optimize his respiratory status. His baseline functional status appears to be quite poor.  5. Hyperglycemia. Felt to be related to steroids. Check hemoglobin A1c. Start on sliding scale insulin.  6. Hypothyroidism. Continue Synthroid  7. Hyperlipidemia. Continue statin   DVT prophylaxis: lovenox Code Status: Partial code, no chest compressions, no defibrillation Family Communication: discussed with wife at the bedside Disposition Plan: probably discharge home when improved Consults called: pulmonology Admission status: inpatient, stepdown   [facility] MD [facility] Pager [phone]  If 7PM-7AM, please contact night-coverage [email or web address] Password [email or web address]  05/20/2016, 3:20 PM

## 2016-05-20 NOTE — ED Notes (Signed)
Patient I-stat Lac 3.10 RN notified.

## 2016-05-20 NOTE — ED Triage Notes (Signed)
PT c/o SOB on exertion worsening x2 weeks with increased left sided lower extremity edema.

## 2016-05-20 NOTE — ED Notes (Signed)
Patient transported to X-ray 

## 2016-05-20 NOTE — ED Notes (Signed)
Attempted to obtain 2nd IV site x2. Left hand vein blew forming large hematoma across top of hand. Veins thin and fragile. Skin paperthin. Attempted left ac. Large hematoma formed

## 2016-05-20 NOTE — ED Notes (Signed)
CRITICAL VALUE ALERT  Critical value received:  Lactic acid 3.10  Date of notification:  05/20/16  Time of notification:  0958  Critical value read back: yes  Nurse who received alert:  Mickeal Skinner Evelena Masci RN  MD notified (1st page): Dr Hyacinth MeekerMiller

## 2016-05-20 NOTE — ED Notes (Signed)
Pt up to bedside to urinate in urinal. Became very sob and sats decreased. Oxygen pulled out of wall during transfer.Sats increased to 97%

## 2016-05-21 LAB — CBC
HCT: 36.2 % — ABNORMAL LOW (ref 39.0–52.0)
HEMOGLOBIN: 11.4 g/dL — AB (ref 13.0–17.0)
MCH: 29.9 pg (ref 26.0–34.0)
MCHC: 31.5 g/dL (ref 30.0–36.0)
MCV: 95 fL (ref 78.0–100.0)
Platelets: 194 10*3/uL (ref 150–400)
RBC: 3.81 MIL/uL — AB (ref 4.22–5.81)
RDW: 13.4 % (ref 11.5–15.5)
WBC: 8.3 10*3/uL (ref 4.0–10.5)

## 2016-05-21 LAB — GLUCOSE, CAPILLARY
GLUCOSE-CAPILLARY: 224 mg/dL — AB (ref 65–99)
GLUCOSE-CAPILLARY: 411 mg/dL — AB (ref 65–99)
Glucose-Capillary: 418 mg/dL — ABNORMAL HIGH (ref 65–99)
Glucose-Capillary: 319 mg/dL — ABNORMAL HIGH (ref 65–99)
Glucose-Capillary: 161 mg/dL — ABNORMAL HIGH (ref 65–99)
Glucose-Capillary: 342 mg/dL — ABNORMAL HIGH (ref 65–99)

## 2016-05-21 LAB — BASIC METABOLIC PANEL
ANION GAP: 8 (ref 5–15)
BUN: 16 mg/dL (ref 6–20)
CHLORIDE: 97 mmol/L — AB (ref 101–111)
CO2: 30 mmol/L (ref 22–32)
Calcium: 8.6 mg/dL — ABNORMAL LOW (ref 8.9–10.3)
Creatinine, Ser: 0.62 mg/dL (ref 0.61–1.24)
GFR calc Af Amer: >60 mL/min (ref >60)
Glucose, Bld: 244 mg/dL — ABNORMAL HIGH (ref 65–99)
POTASSIUM: 4.1 mmol/L (ref 3.5–5.1)
SODIUM: 135 mmol/L (ref 135–145)

## 2016-05-21 LAB — INFLUENZA PANEL BY PCR (TYPE A & B)
INFLBPCR: NEGATIVE
Influenza A By PCR: NEGATIVE

## 2016-05-21 MED ORDER — INSULIN ASPART 100 UNIT/ML ~~LOC~~ SOLN
25.0000 [IU] | Freq: Once | SUBCUTANEOUS | Status: AC
  Administered 2016-05-21: 25 [IU] via SUBCUTANEOUS

## 2016-05-21 MED ORDER — INSULIN GLARGINE 100 UNIT/ML ~~LOC~~ SOLN
10.0000 [IU] | Freq: Every day | SUBCUTANEOUS | Status: DC
  Administered 2016-05-21 – 2016-06-01 (×12): 10 [IU] via SUBCUTANEOUS
  Filled 2016-05-21 (×14): qty 0.1

## 2016-05-21 MED ORDER — INSULIN ASPART 100 UNIT/ML ~~LOC~~ SOLN
0.0000 [IU] | Freq: Three times a day (TID) | SUBCUTANEOUS | Status: DC
  Administered 2016-05-21: 3 [IU] via SUBCUTANEOUS
  Administered 2016-05-22: 4 [IU] via SUBCUTANEOUS
  Administered 2016-05-22 (×2): 11 [IU] via SUBCUTANEOUS
  Administered 2016-05-23: 7 [IU] via SUBCUTANEOUS
  Administered 2016-05-23: 4 [IU] via SUBCUTANEOUS
  Administered 2016-05-23: 7 [IU] via SUBCUTANEOUS
  Administered 2016-05-24: 11 [IU] via SUBCUTANEOUS
  Administered 2016-05-24: 7 [IU] via SUBCUTANEOUS
  Administered 2016-05-24: 3 [IU] via SUBCUTANEOUS
  Administered 2016-05-25: 15 [IU] via SUBCUTANEOUS
  Administered 2016-05-25: 3 [IU] via SUBCUTANEOUS
  Administered 2016-05-25: 4 [IU] via SUBCUTANEOUS
  Administered 2016-05-26 (×2): 7 [IU] via SUBCUTANEOUS
  Administered 2016-05-26: 15 [IU] via SUBCUTANEOUS
  Administered 2016-05-27 (×3): 7 [IU] via SUBCUTANEOUS
  Administered 2016-05-28: 15 [IU] via SUBCUTANEOUS
  Administered 2016-05-28 – 2016-05-29 (×2): 4 [IU] via SUBCUTANEOUS
  Administered 2016-05-29: 3 [IU] via SUBCUTANEOUS
  Administered 2016-05-29: 7 [IU] via SUBCUTANEOUS
  Administered 2016-05-30 (×2): 4 [IU] via SUBCUTANEOUS
  Administered 2016-05-30: 15 [IU] via SUBCUTANEOUS
  Administered 2016-05-31: 3 [IU] via SUBCUTANEOUS
  Administered 2016-05-31: 7 [IU] via SUBCUTANEOUS
  Administered 2016-05-31: 15 [IU] via SUBCUTANEOUS
  Administered 2016-06-01: 11 [IU] via SUBCUTANEOUS
  Administered 2016-06-01: 15 [IU] via SUBCUTANEOUS
  Administered 2016-06-02: 11 [IU] via SUBCUTANEOUS
  Administered 2016-06-02 – 2016-06-03 (×2): 7 [IU] via SUBCUTANEOUS
  Administered 2016-06-03: 15 [IU] via SUBCUTANEOUS

## 2016-05-21 MED ORDER — INSULIN ASPART 100 UNIT/ML ~~LOC~~ SOLN
0.0000 [IU] | Freq: Every day | SUBCUTANEOUS | Status: DC
  Administered 2016-05-21: 4 [IU] via SUBCUTANEOUS
  Administered 2016-05-22: 2 [IU] via SUBCUTANEOUS
  Administered 2016-05-23: 3 [IU] via SUBCUTANEOUS
  Administered 2016-05-28: 2 [IU] via SUBCUTANEOUS
  Administered 2016-05-29: 3 [IU] via SUBCUTANEOUS
  Administered 2016-05-30 – 2016-06-01 (×2): 2 [IU] via SUBCUTANEOUS

## 2016-05-21 NOTE — Consult Note (Signed)
Consult requested by:Dr. Memon  Consult requested for: Respiratory failure  HPI: This is an 83 year old who is known to have chronic interstitial lung disease with diagnosis made several years ago. He has been on chronic prednisone and has been on chronic CellCept. Additionally he has some element of COPD. He has been on oxygen at home for several years. He says he's been coughing productively although is not this morning. He has some sore throat this morning. He had a very similar presentation several years ago and he was found to have influenza a and I think he did not get  the typical symptoms of influenza because of being on the steroids and immunosuppressive drugs. We'll check him again for flu. He says he has some chest pain when he coughs. He's not had any definite fever or chills is noted. No vomiting no diarrhea no urinary symptoms he has had a headache he now has sore throat.  Past Medical History:  Diagnosis Date  . Achilles tendon rupture   . Arthritis   . BPH (benign prostatic hyperplasia)   . CAD (coronary artery disease)   . Cataracts, bilateral   . COPD (chronic obstructive pulmonary disease) (HCC)   . GERD (gastroesophageal reflux disease)   . Hyperlipemia   . Hypothyroid   . ILD (interstitial lung disease) (HCC)   . Prostate hyperplasia, benign localized, with urinary obstruction      Family History  Problem Relation Age of Onset  . Heart disease Mother 75    rheumatic heart disease  . Stroke Father   . Hypertension Father   . Cancer Sister     breast  . Hypertension Brother   . Hypertension Brother   . Hypertension Brother      Social History   Social History  . Marital status: Married    Spouse name: N/A  . Number of children: N/A  . Years of education: N/A   Occupational History  . truck driver    Social History Main Topics  . Smoking status: Former Smoker    Packs/day: 2.00    Years: 30.00    Types: Cigarettes    Quit date: 03/29/1980  .  Smokeless tobacco: Never Used  . Alcohol use No  . Drug use: No  . Sexual activity: Not Asked   Other Topics Concern  . None   Social History Narrative  . None     ROS: Except as mentioned above 10 point review of systems is negative    Objective: Vital signs in last 24 hours: Temp:  [97.8 F (36.6 C)-98.5 F (36.9 C)] 98.2 F (36.8 C) (02/23 0400) Pulse Rate:  [73-85] 75 (02/22 1619) Resp:  [24-38] 25 (02/22 1619) BP: (116-135)/(64-85) 125/85 (02/22 2119) SpO2:  [96 %-100 %] 97 % (02/23 0515) Weight:  [74.8 kg (165 lb)-76.7 kg (169 lb 1.5 oz)] 76.7 kg (169 lb 1.5 oz) (02/23 0500) Weight change:  Last BM Date: 05/20/16  Intake/Output from previous day: 02/22 0701 - 02/23 0700 In: 240 [P.O.:240] Out: 750 [Urine:750]  PHYSICAL EXAM Constitutional: He is awake and alert cushingoid. Eyes: Pupils react. EOMI. Ears nose mouth and throat: Mucous membranes are moist. Throat is clear. No lymph nodes in his neck. Hearing is grossly normal. Cardiovascular: His heart is regular with normal heart sounds. Respiratory: Respiratory effort is increased. He has bilateral crackles diffusely throughout the lung fields gastrointestinal: His abdomen is soft with no masses. Bowel sounds are present and active. Skin: He has bruising diffusely likely from  his prednisone. Neurological: No focal abnormalities psychiatric: Normal mood and affect  Lab Results: Basic Metabolic Panel:  Recent Labs  21/30/8602/22/18 0920 05/20/16 1313 05/21/16 0426  NA 134* 134* 135  K 4.2 3.5 4.1  CL 95* 93* 97*  CO2 32  --  30  GLUCOSE 278* 232* 244*  BUN 14 13 16   CREATININE 0.74 0.60* 0.62  CALCIUM 8.9  --  8.6*   Liver Function Tests:  Recent Labs  05/20/16 0920  AST 23  ALT 22  ALKPHOS 46  BILITOT 0.9  PROT 6.2*  ALBUMIN 3.4*   No results for input(s): LIPASE, AMYLASE in the last 72 hours. No results for input(s): AMMONIA in the last 72 hours. CBC:  Recent Labs  05/20/16 0920 05/20/16 1313  05/21/16 0426  WBC 9.2  --  8.3  NEUTROABS 7.5  --   --   HGB 11.8* 12.2* 11.4*  HCT 36.8* 36.0* 36.2*  MCV 95.3  --  95.0  PLT 184  --  194   Cardiac Enzymes:  Recent Labs  05/20/16 0920  TROPONINI <0.03   BNP: No results for input(s): PROBNP in the last 72 hours. D-Dimer: No results for input(s): DDIMER in the last 72 hours. CBG:  Recent Labs  05/20/16 1602 05/20/16 2112  GLUCAP 204* 336*   Hemoglobin A1C: No results for input(s): HGBA1C in the last 72 hours. Fasting Lipid Panel: No results for input(s): CHOL, HDL, LDLCALC, TRIG, CHOLHDL, LDLDIRECT in the last 72 hours. Thyroid Function Tests: No results for input(s): TSH, T4TOTAL, FREET4, T3FREE, THYROIDAB in the last 72 hours. Anemia Panel: No results for input(s): VITAMINB12, FOLATE, FERRITIN, TIBC, IRON, RETICCTPCT in the last 72 hours. Coagulation: No results for input(s): LABPROT, INR in the last 72 hours. Urine Drug Screen: Drugs of Abuse  No results found for: LABOPIA, COCAINSCRNUR, LABBENZ, AMPHETMU, THCU, LABBARB  Alcohol Level: No results for input(s): ETH in the last 72 hours. Urinalysis:  Recent Labs  05/20/16 0944  COLORURINE YELLOW  LABSPEC 1.020  PHURINE 6.0  GLUCOSEU >=500*  HGBUR NEGATIVE  BILIRUBINUR NEGATIVE  KETONESUR NEGATIVE  PROTEINUR NEGATIVE  NITRITE NEGATIVE  LEUKOCYTESUR NEGATIVE   Misc. Labs:   ABGS:  Recent Labs  05/20/16 1313  TCO2 31     MICROBIOLOGY: Recent Results (from the past 240 hour(s))  Blood Culture (routine x 2)     Status: None (Preliminary result)   Collection Time: 05/20/16 10:10 AM  Result Value Ref Range Status   Specimen Description BLOOD RIGHT FOREARM  Final   Special Requests BOTTLES DRAWN AEROBIC ONLY 6CC  Final   Culture NO GROWTH <12 HOURS  Final   Report Status PENDING  Incomplete  Blood Culture (routine x 2)     Status: None (Preliminary result)   Collection Time: 05/20/16 10:20 AM  Result Value Ref Range Status   Specimen  Description BLOOD LEFT ARM  Final   Special Requests BOTTLES DRAWN AEROBIC AND ANAEROBIC 6CC  Final   Culture NO GROWTH <12 HOURS  Final   Report Status PENDING  Incomplete  MRSA PCR Screening     Status: None   Collection Time: 05/20/16  1:52 PM  Result Value Ref Range Status   MRSA by PCR NEGATIVE NEGATIVE Final    Comment:        The GeneXpert MRSA Assay (FDA approved for NASAL specimens only), is one component of a comprehensive MRSA colonization surveillance program. It is not intended to diagnose MRSA infection nor to guide  or monitor treatment for MRSA infections.     Studies/Results: Dg Chest 2 View  Result Date: 05/20/2016 CLINICAL DATA:  Shortness of breath. EXAM: CHEST  2 VIEW COMPARISON:  CT 07/21/2015 .  Chest x-ray 07/21/2015, 03/27/2012. FINDINGS: Cardiomegaly with normal pulmonary vascularity again noted. Diffuse interstitial changes noted consistent with chronic interstitial lung disease. Degenerative changes thoracic spine. IMPRESSION: 1.  Stable cardiomegaly. 2. Chronic interstitial changes consistent with chronic interstitial lung disease. Superimposed active interstitial process cannot be excluded. Electronically Signed   By: Maisie Fus  Register   On: 05/20/2016 10:18    Medications:  Prior to Admission:  Prescriptions Prior to Admission  Medication Sig Dispense Refill Last Dose  . albuterol (PROVENTIL) (2.5 MG/3ML) 0.083% nebulizer solution Take 3 mLs (2.5 mg total) by nebulization every 6 (six) hours as needed for wheezing or shortness of breath. 75 mL 12 05/19/2016 at Unknown time  . amoxicillin-clavulanate (AUGMENTIN) 875-125 MG tablet Take 1 tablet by mouth 2 (two) times daily. 60 tablet 1 05/19/2016 at Unknown time  . aspirin EC 325 MG tablet Take 325 mg by mouth daily.   05/20/2016 at Unknown time  . atorvastatin (LIPITOR) 10 MG tablet Take 1 tablet (10 mg total) by mouth daily. 90 tablet 3 05/19/2016 at Unknown time  . Cholecalciferol (VITAMIN D) 2000 UNITS  CAPS Take 2,000-4,000 Units by mouth daily. Takes 2000 units everyday except for Fri, Sat, and Sun patient takes 4000 units.   05/20/2016 at Unknown time  . citalopram (CELEXA) 20 MG tablet Take 1 tablet (20 mg total) by mouth daily. 90 tablet 3 05/20/2016 at Unknown time  . levothyroxine (SYNTHROID, LEVOTHROID) 125 MCG tablet Take 1 tablet (125 mcg total) by mouth daily.   05/20/2016 at Unknown time  . levothyroxine (SYNTHROID, LEVOTHROID) 25 MCG tablet Take 12.5 mcg by mouth. Take Monday, Wednesday and Friday with the   05/20/2016 at Unknown time  . mycophenolate (CELLCEPT) 500 MG tablet Take 2 tablets by mouth 2 (two) times daily.   05/20/2016 at Unknown time  . omeprazole (PRILOSEC) 20 MG capsule Take 1 capsule (20 mg total) by mouth every evening. 90 capsule 3 05/19/2016 at Unknown time  . predniSONE (DELTASONE) 10 MG tablet Take 15 mg by mouth.    05/19/2016 at Unknown time  . terazosin (HYTRIN) 10 MG capsule Take 1 capsule (10 mg total) by mouth at bedtime. 90 capsule 3 05/19/2016 at Unknown time   Scheduled: . aspirin EC  325 mg Oral Daily  . atorvastatin  10 mg Oral Daily  . azithromycin  500 mg Intravenous Q24H  . cefTRIAXone (ROCEPHIN)  IV  1 g Intravenous Q24H  . citalopram  20 mg Oral Daily  . enoxaparin (LOVENOX) injection  40 mg Subcutaneous Q24H  . guaiFENesin  1,200 mg Oral BID  . insulin aspart  0-15 Units Subcutaneous TID WC  . insulin aspart  0-5 Units Subcutaneous QHS  . ipratropium-albuterol  3 mL Nebulization Q4H  . levothyroxine  12.5 mcg Oral Once per day on Mon Wed Fri  . levothyroxine  125 mcg Oral QAC breakfast  . methylPREDNISolone (SOLU-MEDROL) injection  60 mg Intravenous Q6H  . mycophenolate  1,000 mg Oral BID  . pantoprazole  40 mg Oral Daily  . terazosin  10 mg Oral QHS   Continuous: . sodium chloride 75 mL/hr at 05/20/16 1601   ZOX:[id number] **OR** acetaminophen, albuterol, ondansetron **OR** ondansetron (ZOFRAN) IV  Assesment: He was admitted  with acute on chronic hypoxic respiratory failure. He has  pulmonary fibrosis at baseline. He is appropriately being treated for community-acquired pneumonia also. He has had a similar presentation in the past with influenza A so he'll be checked for that. Chest x-ray which I personally reviewed is similar to that from January of this year and April of last year Active Problems:   Hypothyroidism   Hyperlipidemia, acquired   PULMONARY FIBROSIS ILD POST INFLAMMATORY CHRONIC   CAP (community acquired pneumonia)   Pulmonary fibrosis (HCC)   Hyperglycemia, drug-induced   COPD with exacerbation (HCC)   Acute on chronic respiratory failure (HCC)    Plan: He is on appropriate treatment. Check influenza PCR. No other changes  Thanks for allowing me to see him with you    LOS: 1 day   Arlow Spiers L 05/21/2016, 7:31 AM

## 2016-05-21 NOTE — Progress Notes (Signed)
12:00 CBG 418, paged MD about critical value. MD placed new orders. Will continue to monitor and page if CBG remains high.

## 2016-05-21 NOTE — Progress Notes (Signed)
[facility] Pt taken to [facility] 333 via wheelchair. Tolerated well.

## 2016-05-21 NOTE — Progress Notes (Signed)
PROGRESS NOTE    CHRISTOHER DRUDGE  ZOX:[id number] DOB: 06-17-1929 DOA: 05/20/2016 PCP: Rudi Heap, MD    Brief Narrative:  83 year old male with a history of chronic respiratory failure on 4 L of oxygen, COPD, interstitial lung disease/pulmonary fibrosis, came to the hospital with worsening shortness of breath and found to have possible pneumonia, COPD exacerbation. Started on steroids, antibiotics and bronchodilators. Slowly improving. Pulmonology following.   Assessment & Plan:   Active Problems:   Hypothyroidism   Hyperlipidemia, acquired   PULMONARY FIBROSIS ILD POST INFLAMMATORY CHRONIC   CAP (community acquired pneumonia)   Pulmonary fibrosis (HCC)   Hyperglycemia, drug-induced   COPD with exacerbation (HCC)   Acute on chronic respiratory failure (HCC)   1. Acute on chronic respiratory failure with hypoxia. Suspect this is related to pneumonia as well as COPD/pulmonary fibrosis. Wean oxygen down the baseline requirement of 4 L.  2. Probable community-acquired pneumonia. Treat with ceftriaxone and azithromycin. Influenza panel has been ordered.  3. COPD exacerbation. Continue on IV steroids, antibiotics and bronchodilators. Continue pulmonary hygiene. Slowly improving  4. Interstitial lung disease/pulmonary fibrosis. Continue mycophenolate and steroids. Pulmonology following. His baseline functional status appears to be quite poor.  5. Hyperglycemia. Felt to be related to steroids. Hemoglobin A1c in process. On sliding scale insulin.  6. Hypothyroidism. Continue Synthroid  7. Hyperlipidemia. Continue statin   DVT prophylaxis: Lovenox Code Status: Partial code, no CPR, no defibrillation Family Communication: Discussed with wife at the bedside Disposition Plan: Discharge home once improved   Consultants:   Pulmonology  Procedures:     Antimicrobials:   Ceftriaxone 2/22>>  Azithromycin 2/22>>   Subjective: Wheezing better. Shortness of breath  improving.  Objective: Vitals:   05/21/16 0600 05/21/16 0700 05/21/16 0800 05/21/16 0851  BP: 98/64 131/74 (!) 146/70   Pulse: 80 82 93   Resp: (!) 28 (!) 30 (!) 25   Temp:      TempSrc:      SpO2: 97% 98% 96% 96%  Weight:      Height:        Intake/Output Summary (Last 24 hours) at 05/21/16 0928 Last data filed at 05/20/16 2300  Gross per 24 hour  Intake              240 ml  Output              750 ml  Net             -510 ml   Filed Weights   05/20/16 0914 05/20/16 1345 05/21/16 0500  Weight: 74.8 kg (165 lb) 75.2 kg (165 lb 12.6 oz) 76.7 kg (169 lb 1.5 oz)    Examination:  General exam: Appears calm and comfortable  Respiratory system: Wheezing is better. Fair air movement bilaterally. Mild increased respiratory effort. Cardiovascular system: S1 & S2 heard, RRR. No JVD, murmurs, rubs, gallops or clicks. No pedal edema. Gastrointestinal system: Abdomen is nondistended, soft and nontender. No organomegaly or masses felt. Normal bowel sounds heard. Central nervous system: Alert and oriented. No focal neurological deficits. Extremities: Symmetric 5 x 5 power. Skin: No rashes, lesions or ulcers Psychiatry: Judgement and insight appear normal. Mood & affect appropriate.     Data Reviewed: I have personally reviewed following labs and imaging studies  CBC:  Recent Labs Lab 05/20/16 0920 05/20/16 1313 05/21/16 0426  WBC 9.2  --  8.3  NEUTROABS 7.5  --   --   HGB 11.8* 12.2* 11.4*  HCT 36.8* 36.0* 36.2*  MCV 95.3  --  95.0  PLT 184  --  194   Basic Metabolic Panel:  Recent Labs Lab 05/20/16 0920 05/20/16 1313 05/21/16 0426  NA 134* 134* 135  K 4.2 3.5 4.1  CL 95* 93* 97*  CO2 32  --  30  GLUCOSE 278* 232* 244*  BUN 14 13 16   CREATININE 0.74 0.60* 0.62  CALCIUM 8.9  --  8.6*   GFR: Estimated Creatinine Clearance: 65.9 mL/min (by C-G formula based on SCr of 0.62 mg/dL). Liver Function Tests:  Recent Labs Lab 05/20/16 0920  AST 23  ALT 22    ALKPHOS 46  BILITOT 0.9  PROT 6.2*  ALBUMIN 3.4*   No results for input(s): LIPASE, AMYLASE in the last 168 hours. No results for input(s): AMMONIA in the last 168 hours. Coagulation Profile: No results for input(s): INR, PROTIME in the last 168 hours. Cardiac Enzymes:  Recent Labs Lab 05/20/16 0920  TROPONINI <0.03   BNP (last 3 results) No results for input(s): PROBNP in the last 8760 hours. HbA1C: No results for input(s): HGBA1C in the last 72 hours. CBG:  Recent Labs Lab 05/20/16 1602 05/20/16 2112 05/21/16 0807  GLUCAP 204* 336* 224*   Lipid Profile: No results for input(s): CHOL, HDL, LDLCALC, TRIG, CHOLHDL, LDLDIRECT in the last 72 hours. Thyroid Function Tests: No results for input(s): TSH, T4TOTAL, FREET4, T3FREE, THYROIDAB in the last 72 hours. Anemia Panel: No results for input(s): VITAMINB12, FOLATE, FERRITIN, TIBC, IRON, RETICCTPCT in the last 72 hours. Sepsis Labs:  Recent Labs Lab 05/20/16 0957 05/20/16 1323  LATICACIDVEN 3.10* 2.67*    Recent Results (from the past 240 hour(s))  Blood Culture (routine x 2)     Status: None (Preliminary result)   Collection Time: 05/20/16 10:10 AM  Result Value Ref Range Status   Specimen Description BLOOD RIGHT FOREARM  Final   Special Requests BOTTLES DRAWN AEROBIC ONLY 6CC  Final   Culture NO GROWTH <12 HOURS  Final   Report Status PENDING  Incomplete  Blood Culture (routine x 2)     Status: None (Preliminary result)   Collection Time: 05/20/16 10:20 AM  Result Value Ref Range Status   Specimen Description BLOOD LEFT ARM  Final   Special Requests BOTTLES DRAWN AEROBIC AND ANAEROBIC 6CC  Final   Culture NO GROWTH <12 HOURS  Final   Report Status PENDING  Incomplete  MRSA PCR Screening     Status: None   Collection Time: 05/20/16  1:52 PM  Result Value Ref Range Status   MRSA by PCR NEGATIVE NEGATIVE Final    Comment:        The GeneXpert MRSA Assay (FDA approved for NASAL specimens only), is one  component of a comprehensive MRSA colonization surveillance program. It is not intended to diagnose MRSA infection nor to guide or monitor treatment for MRSA infections.          Radiology Studies: Dg Chest 2 View  Result Date: 05/20/2016 CLINICAL DATA:  Shortness of breath. EXAM: CHEST  2 VIEW COMPARISON:  CT 07/21/2015 .  Chest x-ray 07/21/2015, 03/27/2012. FINDINGS: Cardiomegaly with normal pulmonary vascularity again noted. Diffuse interstitial changes noted consistent with chronic interstitial lung disease. Degenerative changes thoracic spine. IMPRESSION: 1.  Stable cardiomegaly. 2. Chronic interstitial changes consistent with chronic interstitial lung disease. Superimposed active interstitial process cannot be excluded. Electronically Signed   By: Maisie Fushomas  Register   On: 05/20/2016 10:18        Scheduled Meds: .  aspirin EC  325 mg Oral Daily  . atorvastatin  10 mg Oral Daily  . azithromycin  500 mg Intravenous Q24H  . cefTRIAXone (ROCEPHIN)  IV  1 g Intravenous Q24H  . citalopram  20 mg Oral Daily  . enoxaparin (LOVENOX) injection  40 mg Subcutaneous Q24H  . guaiFENesin  1,200 mg Oral BID  . insulin aspart  0-15 Units Subcutaneous TID WC  . insulin aspart  0-5 Units Subcutaneous QHS  . ipratropium-albuterol  3 mL Nebulization Q4H  . levothyroxine  12.5 mcg Oral Once per day on Mon Wed Fri  . levothyroxine  125 mcg Oral QAC breakfast  . methylPREDNISolone (SOLU-MEDROL) injection  60 mg Intravenous Q6H  . mycophenolate  1,000 mg Oral BID  . pantoprazole  40 mg Oral Daily  . terazosin  10 mg Oral QHS   Continuous Infusions: . sodium chloride 75 mL/hr at 05/20/16 1601     LOS: 1 day    Time spent:    Maly Lemarr, MD [facility] Pager [phone]  If 7PM-7AM, please contact night-coverage [email or web address] Password [facility] 05/21/2016, 9:28 AM

## 2016-05-21 NOTE — Care Management Important Message (Signed)
Important Message  Patient Details  Name: Raymond SkainsWilliam H Strebel MRN: [id number] Date of Birth: 1929-08-30   Medicare Important Message Given:  Yes    Shirlene Andaya, Chrystine OilerSharley Diane, RN 05/21/2016, 2:04 PM

## 2016-05-21 NOTE — Care Management Note (Signed)
Case Management Note  Patient Details  Name: Raymond [name] MRN: [id number] Date of Birth: [birth year]  Subjective/Objective:  Patient adm with Acute on chronic respiratory failure. He lives with wife, walks with a cane at times. Has continuous oxygen at home provided by [facility]. He has a PCP, wife drives him to appointments, and reports no issues affording medications. Patient may benefit from PT eval when appropriate. He reports having used OP PT in the past.   Action/Plan: Anticipate DC home, possible with OP PT or HH PT services.    Expected Discharge Date:   05/24/2016               Expected Discharge Plan:  Home w Home Health Services  In-House Referral:     Discharge planning Services  CM Consult  Post Acute Care Choice:    Choice offered to:     DME Arranged:    DME Agency:     HH Arranged:    HH Agency:     Status of Service:  In process, will continue to follow  If discussed at Long Length of Stay Meetings, dates discussed:    Additional Comments:  Angellina Ferdinand, Chrystine OilerSharley Diane, RN 05/21/2016, 2:00 PM

## 2016-05-21 NOTE — Progress Notes (Signed)
Inpatient Diabetes Program Recommendations  AACE/ADA: New Consensus Statement on Inpatient Glycemic Control (2015)  Target Ranges:  Prepandial:   less than 140 mg/dL      Peak postprandial:   less than 180 mg/dL (1-2 hours)      Critically ill patients:  140 - 180 mg/dL  Results for Raymond [name], Raymond [name] (MRN [id number]) as of 05/21/2016 07:37  Ref. Range 05/20/2016 09:20 05/20/2016 13:13 05/21/2016 04:26  Glucose Latest Ref Range: 65 - 99 mg/dL 829278 ([name]) 562232 ([name]) 130244 ([name])   Results for Raymond [name], Raymond [name] (MRN [id number]) as of 05/21/2016 07:37  Ref. Range 05/20/2016 16:02 05/20/2016 21:12  Glucose-Capillary Latest Ref Range: 65 - 99 mg/dL 295204 ([name]) 284336 ([name])   Review of Glycemic Control  Diabetes history: No Outpatient Diabetes medications: NA Current orders for Inpatient glycemic control: Novolog 0-15 units TID with meals, Novolog 0-5 units QHS  Inpatient Diabetes Program Recommendations:  Insulin - Basal: If steroids are continued as ordered, please consider ordering Lantus 8 units Q24H (based on 76.7 kg x 0.1 units). Please note, if Lantus is ordered as recommended it will need to be adjusted as steroids are tapered.  Thanks, Orlando PennerMarie Davyon Fisch, RN, MSN, CDE Diabetes Coordinator Inpatient Diabetes Program [phone] (Team Pager from 8am to 5pm)

## 2016-05-22 LAB — GLUCOSE, CAPILLARY
GLUCOSE-CAPILLARY: 265 mg/dL — AB (ref 65–99)
GLUCOSE-CAPILLARY: 222 mg/dL — AB (ref 65–99)
Glucose-Capillary: 262 mg/dL — ABNORMAL HIGH (ref 65–99)
Glucose-Capillary: 182 mg/dL — ABNORMAL HIGH (ref 65–99)

## 2016-05-22 MED ORDER — FUROSEMIDE 10 MG/ML IJ SOLN
20.0000 mg | Freq: Once | INTRAMUSCULAR | Status: AC
  Administered 2016-05-22: 20 mg via INTRAVENOUS
  Filled 2016-05-22: qty 2

## 2016-05-22 MED ORDER — SODIUM CHLORIDE 0.9 % NICU IV INFUSION SIMPLE
INJECTION | INTRAVENOUS | Status: DC
  Administered 2016-05-24: 10 mL/h via INTRAVENOUS
  Filled 2016-05-22 (×5): qty 500

## 2016-05-22 NOTE — Progress Notes (Signed)
PROGRESS NOTE    Raymond SkainsWilliam H Helmers  WUJ:[id number] DOB: 18-May-1929 DOA: 05/20/2016 PCP: Rudi HeapMOORE, DONALD, MD    Brief Narrative:  83 year old male with a history of chronic respiratory failure on 4 L of oxygen, COPD, interstitial lung disease/pulmonary fibrosis, came to the hospital with worsening shortness of breath and found to have possible pneumonia, COPD exacerbation. Started on steroids, antibiotics and bronchodilators. Slowly improving. Pulmonology following.   Assessment & Plan:   Active Problems:   Hypothyroidism   Hyperlipidemia, acquired   PULMONARY FIBROSIS ILD POST INFLAMMATORY CHRONIC   CAP (community acquired pneumonia)   Pulmonary fibrosis (HCC)   Hyperglycemia, drug-induced   COPD with exacerbation (HCC)   Acute on chronic respiratory failure (HCC)   1. Acute on chronic respiratory failure with hypoxia. Suspect this is related to pneumonia as well as COPD/pulmonary fibrosis. Currently on baseline oxygen requirement of 4 L.  2. Probable community-acquired pneumonia. Treat with ceftriaxone and azithromycin. Influenza panel negative.  3. COPD exacerbation. Continue on IV steroids, antibiotics and bronchodilators. Continue pulmonary hygiene. Slowly improving, although he does not feel back to baseline yet  4. Interstitial lung disease/pulmonary fibrosis. Continue mycophenolate and steroids. Pulmonology following. His baseline functional status appears to be quite poor.  5. Hyperglycemia. Felt to be related to steroids. Hemoglobin A1c in process. On sliding scale insulin and Lantus.  6. Hypothyroidism. Continue Synthroid  7. Hyperlipidemia. Continue statin   DVT prophylaxis: Lovenox Code Status: Partial code, no CPR, no defibrillation Family Communication: Discussed with wife at the bedside Disposition Plan: Discharge home once improved   Consultants:   Pulmonology  Procedures:     Antimicrobials:   Ceftriaxone 2/22>>  Azithromycin  2/22>>   Subjective: Has nonproductive cough, shortness of breath is improving.  Objective: Vitals:   05/22/16 0000 05/22/16 0400 05/22/16 0545 05/22/16 0734  BP:   (!) 131/58   Pulse:   99   Resp:   18   Temp:   98.4 F (36.9 C)   TempSrc:   Oral   SpO2: 98% 95% 92% 95%  Weight:      Height:        Intake/Output Summary (Last 24 hours) at 05/22/16 1028 Last data filed at 05/22/16 0748  Gross per 24 hour  Intake             1170 ml  Output              750 ml  Net              420 ml   Filed Weights   05/20/16 0914 05/20/16 1345 05/21/16 0500  Weight: 74.8 kg (165 lb) 75.2 kg (165 lb 12.6 oz) 76.7 kg (169 lb 1.5 oz)    Examination:  General exam: Appears calm and comfortable  Respiratory system: mild wheeze bilaterally with rhonchi. Fair air movement bilaterally. Mild increased respiratory effort. Cardiovascular system: S1 & S2 heard, RRR. No JVD, murmurs, rubs, gallops or clicks. No pedal edema. Gastrointestinal system: Abdomen is nondistended, soft and nontender. No organomegaly or masses felt. Normal bowel sounds heard. Central nervous system: Alert and oriented. No focal neurological deficits. Extremities: Symmetric 5 x 5 power. Skin: No rashes, lesions or ulcers Psychiatry: Judgement and insight appear normal. Mood & affect appropriate.     Data Reviewed: I have personally reviewed following labs and imaging studies  CBC:  Recent Labs Lab 05/20/16 0920 05/20/16 1313 05/21/16 0426  WBC 9.2  --  8.3  NEUTROABS 7.5  --   --  HGB 11.8* 12.2* 11.4*  HCT 36.8* 36.0* 36.2*  MCV 95.3  --  95.0  PLT 184  --  194   Basic Metabolic Panel:  Recent Labs Lab 05/20/16 0920 05/20/16 1313 05/21/16 0426  NA 134* 134* 135  K 4.2 3.5 4.1  CL 95* 93* 97*  CO2 32  --  30  GLUCOSE 278* 232* 244*  BUN 14 13 16   CREATININE 0.74 0.60* 0.62  CALCIUM 8.9  --  8.6*   GFR: Estimated Creatinine Clearance: 65.9 mL/min (by C-G formula based on SCr of 0.62  mg/dL). Liver Function Tests:  Recent Labs Lab 05/20/16 0920  AST 23  ALT 22  ALKPHOS 46  BILITOT 0.9  PROT 6.2*  ALBUMIN 3.4*   No results for input(s): LIPASE, AMYLASE in the last 168 hours. No results for input(s): AMMONIA in the last 168 hours. Coagulation Profile: No results for input(s): INR, PROTIME in the last 168 hours. Cardiac Enzymes:  Recent Labs Lab 05/20/16 0920  TROPONINI <0.03   BNP (last 3 results) No results for input(s): PROBNP in the last 8760 hours. HbA1C: No results for input(s): HGBA1C in the last 72 hours. CBG:  Recent Labs Lab 05/21/16 1144 05/21/16 1334 05/21/16 1718 05/21/16 2114 05/22/16 0738  GLUCAP 418* 319* 161* 342* 265*   Lipid Profile: No results for input(s): CHOL, HDL, LDLCALC, TRIG, CHOLHDL, LDLDIRECT in the last 72 hours. Thyroid Function Tests: No results for input(s): TSH, T4TOTAL, FREET4, T3FREE, THYROIDAB in the last 72 hours. Anemia Panel: No results for input(s): VITAMINB12, FOLATE, FERRITIN, TIBC, IRON, RETICCTPCT in the last 72 hours. Sepsis Labs:  Recent Labs Lab 05/20/16 0957 05/20/16 1323  LATICACIDVEN 3.10* 2.67*    Recent Results (from the past 240 hour(s))  Blood Culture (routine x 2)     Status: None (Preliminary result)   Collection Time: 05/20/16 10:10 AM  Result Value Ref Range Status   Specimen Description BLOOD RIGHT FOREARM  Final   Special Requests BOTTLES DRAWN AEROBIC ONLY 6CC  Final   Culture NO GROWTH < 24 HOURS  Final   Report Status PENDING  Incomplete  Blood Culture (routine x 2)     Status: None (Preliminary result)   Collection Time: 05/20/16 10:20 AM  Result Value Ref Range Status   Specimen Description BLOOD LEFT ARM  Final   Special Requests BOTTLES DRAWN AEROBIC AND ANAEROBIC 6CC  Final   Culture NO GROWTH < 24 HOURS  Final   Report Status PENDING  Incomplete  MRSA PCR Screening     Status: None   Collection Time: 05/20/16  1:52 PM  Result Value Ref Range Status   MRSA by  PCR NEGATIVE NEGATIVE Final    Comment:        The GeneXpert MRSA Assay (FDA approved for NASAL specimens only), is one component of a comprehensive MRSA colonization surveillance program. It is not intended to diagnose MRSA infection nor to guide or monitor treatment for MRSA infections.          Radiology Studies: No results found.      Scheduled Meds: . aspirin EC  325 mg Oral Daily  . atorvastatin  10 mg Oral Daily  . azithromycin  500 mg Intravenous Q24H  . cefTRIAXone (ROCEPHIN)  IV  1 g Intravenous Q24H  . citalopram  20 mg Oral Daily  . enoxaparin (LOVENOX) injection  40 mg Subcutaneous Q24H  . guaiFENesin  1,200 mg Oral BID  . insulin aspart  0-20 Units Subcutaneous  TID WC  . insulin aspart  0-5 Units Subcutaneous QHS  . insulin glargine  10 Units Subcutaneous Daily  . ipratropium-albuterol  3 mL Nebulization Q4H  . levothyroxine  12.5 mcg Oral Once per day on Mon Wed Fri  . levothyroxine  125 mcg Oral QAC breakfast  . methylPREDNISolone (SOLU-MEDROL) injection  60 mg Intravenous Q6H  . mycophenolate  1,000 mg Oral BID  . pantoprazole  40 mg Oral Daily  . terazosin  10 mg Oral QHS   Continuous Infusions: . sodium chloride 75 mL/hr at 05/22/16 0823     LOS: 2 days    Time spent:    Deena Shaub, MD [facility] Pager [phone]  If 7PM-7AM, please contact night-coverage [email or web address] Password [facility] 05/22/2016, 10:28 AM

## 2016-05-23 LAB — GLUCOSE, CAPILLARY
GLUCOSE-CAPILLARY: 194 mg/dL — AB (ref 65–99)
GLUCOSE-CAPILLARY: 291 mg/dL — AB (ref 65–99)
Glucose-Capillary: 229 mg/dL — ABNORMAL HIGH (ref 65–99)
Glucose-Capillary: 228 mg/dL — ABNORMAL HIGH (ref 65–99)

## 2016-05-23 LAB — BASIC METABOLIC PANEL
Anion gap: 8 (ref 5–15)
BUN: 27 mg/dL — AB (ref 6–20)
CALCIUM: 8.8 mg/dL — AB (ref 8.9–10.3)
CO2: 34 mmol/L — AB (ref 22–32)
CREATININE: 0.69 mg/dL (ref 0.61–1.24)
Chloride: 96 mmol/L — ABNORMAL LOW (ref 101–111)
GFR calc Af Amer: >60 mL/min (ref >60)
GFR calc non Af Amer: >60 mL/min (ref >60)
Glucose, Bld: 219 mg/dL — ABNORMAL HIGH (ref 65–99)
Potassium: 3.9 mmol/L (ref 3.5–5.1)
Sodium: 138 mmol/L (ref 135–145)

## 2016-05-23 LAB — CBC
HEMATOCRIT: 35.5 % — AB (ref 39.0–52.0)
Hemoglobin: 11.1 g/dL — ABNORMAL LOW (ref 13.0–17.0)
MCH: 30 pg (ref 26.0–34.0)
MCHC: 31.3 g/dL (ref 30.0–36.0)
MCV: 95.9 fL (ref 78.0–100.0)
Platelets: 208 10*3/uL (ref 150–400)
RBC: 3.7 MIL/uL — ABNORMAL LOW (ref 4.22–5.81)
RDW: 13.6 % (ref 11.5–15.5)
WBC: 12 10*3/uL — ABNORMAL HIGH (ref 4.0–10.5)

## 2016-05-23 LAB — HEMOGLOBIN A1C
HEMOGLOBIN A1C: 7.6 % — AB (ref 4.8–5.6)
Mean Plasma Glucose: 171 mg/dL

## 2016-05-23 MED ORDER — IPRATROPIUM-ALBUTEROL 0.5-2.5 (3) MG/3ML IN SOLN
3.0000 mL | RESPIRATORY_TRACT | Status: DC
  Administered 2016-05-23 – 2016-05-31 (×42): 3 mL via RESPIRATORY_TRACT
  Filled 2016-05-23 (×41): qty 3

## 2016-05-23 MED ORDER — AZITHROMYCIN 250 MG PO TABS
500.0000 mg | ORAL_TABLET | Freq: Every day | ORAL | Status: DC
  Administered 2016-05-24 – 2016-05-29 (×6): 500 mg via ORAL
  Filled 2016-05-23 (×6): qty 2

## 2016-05-23 NOTE — Progress Notes (Signed)
PHARMACIST - PHYSICIAN COMMUNICATION DR:   Juanetta GoslingHawkins CONCERNING: Antibiotic IV to Oral Route Change Policy  RECOMMENDATION: This patient is receiving Zithromax by the intravenous route.  Based on criteria approved by the Pharmacy and Therapeutics Committee, the antibiotic(s) is/are being converted to the equivalent oral dose form(s).  DESCRIPTION: These criteria include:  Patient being treated for a respiratory tract infection, urinary tract infection, cellulitis or clostridium difficile associated diarrhea if on metronidazole  The patient is not neutropenic and does not exhibit a GI malabsorption state  The patient is eating (either orally or via tube) and/or has been taking other orally administered medications for a least 24 hours  The patient is improving clinically and has a Tmax < 100.5  If you have questions about this conversion, please contact the Pharmacy Department  [x]   [phone]( [phone] )  [facility] []   [phone]( [phone] )  [facility] []   [phone]( [phone] )  [facility] []   [phone]( [phone] )  [facility] []   [phone]( [phone] )  [facility]     Valrie HartScott Sigifredo Pignato, PharmD Clinical Pharmacist Pager:  [phone] 05/23/2016

## 2016-05-23 NOTE — Progress Notes (Signed)
Subjective: He says he still very short of breath. He's coughing but not bringing much up. No other new complaints. No chest pain no nausea or vomiting. No hemoptysis  Objective: Vital signs in last 24 hours: Temp:  [97.8 F (36.6 C)-98.7 F (37.1 C)] 97.9 F (36.6 C) (02/25 0642) Pulse Rate:  [89-113] 96 (02/25 0642) Resp:  [18-26] 18 (02/25 0642) BP: (123-162)/(69-76) 132/76 (02/25 0642) SpO2:  [91 %-98 %] 96 % (02/25 0731) Weight change:  Last BM Date: 05/21/16  Intake/Output from previous day: 02/24 0701 - 02/25 0700 In: 5427 [P.O.:720; I.V.:535; IV Piggyback:300] Out: 1750 [Urine:1750]  PHYSICAL EXAM General appearance: alert, cooperative and mild distress Resp: Rales bilaterally Cardio: regular rate and rhythm, S1, S2 normal, no murmur, click, rub or gallop GI: soft, non-tender; bowel sounds normal; no masses,  no organomegaly Extremities: extremities normal, atraumatic, no cyanosis or edema Skin warm and dry. Mucous membranes are moist  Lab Results:  Results for orders placed or performed during the hospital encounter of 05/20/16 (from the past 48 hour(s))  Glucose, capillary     Status: Abnormal   Collection Time: 05/21/16 11:42 AM  Result Value Ref Range   Glucose-Capillary 411 (H) 65 - 99 mg/dL   Comment 1 Repeat Test   Glucose, capillary     Status: Abnormal   Collection Time: 05/21/16 11:44 AM  Result Value Ref Range   Glucose-Capillary 418 (H) 65 - 99 mg/dL  Glucose, capillary     Status: Abnormal   Collection Time: 05/21/16  1:34 PM  Result Value Ref Range   Glucose-Capillary 319 (H) 65 - 99 mg/dL  Glucose, capillary     Status: Abnormal   Collection Time: 05/21/16  5:18 PM  Result Value Ref Range   Glucose-Capillary 161 (H) 65 - 99 mg/dL   Comment 1 Notify RN    Comment 2 Document in Chart   Glucose, capillary     Status: Abnormal   Collection Time: 05/21/16  9:14 PM  Result Value Ref Range   Glucose-Capillary 342 (H) 65 - 99 mg/dL   Comment 1  Notify RN    Comment 2 Document in Chart   Glucose, capillary     Status: Abnormal   Collection Time: 05/22/16  7:38 AM  Result Value Ref Range   Glucose-Capillary 265 (H) 65 - 99 mg/dL   Comment 1 Notify RN    Comment 2 Document in Chart   Glucose, capillary     Status: Abnormal   Collection Time: 05/22/16 11:14 AM  Result Value Ref Range   Glucose-Capillary 262 (H) 65 - 99 mg/dL   Comment 1 Notify RN    Comment 2 Document in Chart   Glucose, capillary     Status: Abnormal   Collection Time: 05/22/16  4:11 PM  Result Value Ref Range   Glucose-Capillary 182 (H) 65 - 99 mg/dL   Comment 1 Notify RN    Comment 2 Document in Chart   Glucose, capillary     Status: Abnormal   Collection Time: 05/22/16  9:19 PM  Result Value Ref Range   Glucose-Capillary 222 (H) 65 - 99 mg/dL   Comment 1 Notify RN    Comment 2 Document in Chart   CBC     Status: Abnormal   Collection Time: 05/23/16  7:07 AM  Result Value Ref Range   WBC 12.0 (H) 4.0 - 10.5 K/uL   RBC 3.70 (L) 4.22 - 5.81 MIL/uL   Hemoglobin 11.1 (L) 13.0 -  17.0 g/dL   HCT 35.5 (L) 39.0 - 52.0 %   MCV 95.9 78.0 - 100.0 fL   MCH 30.0 26.0 - 34.0 pg   MCHC 31.3 30.0 - 36.0 g/dL   RDW 13.6 11.5 - 15.5 %   Platelets 208 150 - 400 K/uL  Basic metabolic panel     Status: Abnormal   Collection Time: 05/23/16  7:07 AM  Result Value Ref Range   Sodium 138 135 - 145 mmol/L   Potassium 3.9 3.5 - 5.1 mmol/L   Chloride 96 (L) 101 - 111 mmol/L   CO2 34 (H) 22 - 32 mmol/L   Glucose, Bld 219 (H) 65 - 99 mg/dL   BUN 27 (H) 6 - 20 mg/dL   Creatinine, Ser 0.69 0.61 - 1.24 mg/dL   Calcium 8.8 (L) 8.9 - 10.3 mg/dL   GFR calc non Af Amer >60 >60 mL/min   GFR calc Af Amer >60 >60 mL/min    Comment: (NOTE) The eGFR has been calculated using the CKD EPI equation. This calculation has not been validated in all clinical situations. eGFR's persistently <60 mL/min signify possible Chronic Kidney Disease.    Anion gap 8 5 - 15  Glucose, capillary      Status: Abnormal   Collection Time: 05/23/16  7:33 AM  Result Value Ref Range   Glucose-Capillary 229 (H) 65 - 99 mg/dL   Comment 1 Notify RN    Comment 2 Document in Chart     ABGS  Recent Labs  05/20/16 1313  TCO2 31   CULTURES Recent Results (from the past 240 hour(s))  Blood Culture (routine x 2)     Status: None (Preliminary result)   Collection Time: 05/20/16 10:10 AM  Result Value Ref Range Status   Specimen Description BLOOD RIGHT FOREARM  Final   Special Requests BOTTLES DRAWN AEROBIC ONLY 6CC  Final   Culture NO GROWTH 2 DAYS  Final   Report Status PENDING  Incomplete  Blood Culture (routine x 2)     Status: None (Preliminary result)   Collection Time: 05/20/16 10:20 AM  Result Value Ref Range Status   Specimen Description BLOOD LEFT ARM  Final   Special Requests BOTTLES DRAWN AEROBIC AND ANAEROBIC 6CC  Final   Culture NO GROWTH 2 DAYS  Final   Report Status PENDING  Incomplete  MRSA PCR Screening     Status: None   Collection Time: 05/20/16  1:52 PM  Result Value Ref Range Status   MRSA by PCR NEGATIVE NEGATIVE Final    Comment:        The GeneXpert MRSA Assay (FDA approved for NASAL specimens only), is one component of a comprehensive MRSA colonization surveillance program. It is not intended to diagnose MRSA infection nor to guide or monitor treatment for MRSA infections.    Studies/Results: No results found.  Medications:  Prior to Admission:  Prescriptions Prior to Admission  Medication Sig Dispense Refill Last Dose  . albuterol (PROVENTIL) (2.5 MG/3ML) 0.083% nebulizer solution Take 3 mLs (2.5 mg total) by nebulization every 6 (six) hours as needed for wheezing or shortness of breath. 75 mL 12 05/19/2016 at Unknown time  . amoxicillin-clavulanate (AUGMENTIN) 875-125 MG tablet Take 1 tablet by mouth 2 (two) times daily. 60 tablet 1 05/19/2016 at Unknown time  . aspirin EC 325 MG tablet Take 325 mg by mouth daily.   05/20/2016 at Unknown time  .  atorvastatin (LIPITOR) 10 MG tablet Take 1 tablet (10 mg  total) by mouth daily. 90 tablet 3 05/19/2016 at Unknown time  . Cholecalciferol (VITAMIN D) 2000 UNITS CAPS Take 2,000-4,000 Units by mouth daily. Takes 2000 units everyday except for Fri, Sat, and Sun patient takes 4000 units.   05/20/2016 at Unknown time  . citalopram (CELEXA) 20 MG tablet Take 1 tablet (20 mg total) by mouth daily. 90 tablet 3 05/20/2016 at Unknown time  . levothyroxine (SYNTHROID, LEVOTHROID) 125 MCG tablet Take 1 tablet (125 mcg total) by mouth daily.   05/20/2016 at Unknown time  . levothyroxine (SYNTHROID, LEVOTHROID) 25 MCG tablet Take 12.5 mcg by mouth. Take Monday, Wednesday and Friday with the 164mg   05/20/2016 at Unknown time  . mycophenolate (CELLCEPT) 500 MG tablet Take 2 tablets by mouth 2 (two) times daily.   05/20/2016 at Unknown time  . omeprazole (PRILOSEC) 20 MG capsule Take 1 capsule (20 mg total) by mouth every evening. 90 capsule 3 05/19/2016 at Unknown time  . predniSONE (DELTASONE) 10 MG tablet Take 15 mg by mouth.    05/19/2016 at Unknown time  . terazosin (HYTRIN) 10 MG capsule Take 1 capsule (10 mg total) by mouth at bedtime. 90 capsule 3 05/19/2016 at Unknown time   Scheduled: . aspirin EC  325 mg Oral Daily  . atorvastatin  10 mg Oral Daily  . azithromycin  500 mg Intravenous Q24H  . cefTRIAXone (ROCEPHIN)  IV  1 g Intravenous Q24H  . citalopram  20 mg Oral Daily  . enoxaparin (LOVENOX) injection  40 mg Subcutaneous Q24H  . guaiFENesin  1,200 mg Oral BID  . insulin aspart  0-20 Units Subcutaneous TID WC  . insulin aspart  0-5 Units Subcutaneous QHS  . insulin glargine  10 Units Subcutaneous Daily  . ipratropium-albuterol  3 mL Nebulization Q4H WA  . levothyroxine  12.5 mcg Oral Once per day on Mon Wed Fri  . levothyroxine  125 mcg Oral QAC breakfast  . methylPREDNISolone (SOLU-MEDROL) injection  60 mg Intravenous Q6H  . mycophenolate  1,000 mg Oral BID  . pantoprazole  40 mg Oral Daily  .  terazosin  10 mg Oral QHS   Continuous: . sodium chloride 0.9 % 10 mL/hr (05/22/16 1600)   POIZ:[id number]**OR** acetaminophen, albuterol, ondansetron **OR** ondansetron (ZOFRAN) IV  Assesment: Admitted with pulmonary fibrosis exacerbation community-acquired pneumonia. He has acute on chronic respiratory failure. He is still short of breath with minimal exertion. Active Problems:   Hypothyroidism   Hyperlipidemia, acquired   PULMONARY FIBROSIS ILD POST INFLAMMATORY CHRONIC   CAP (community acquired pneumonia)   [place]ulmonary [place]   Hyperglycemia, drug-induced   COPD with exacerbation ([facility]   [place] chronic [place]    Plan: Continue current treatments    LOS: 3 days   Kevia Zaucha L 05/23/2016, 10:19 AM

## 2016-05-23 NOTE — Progress Notes (Signed)
PROGRESS NOTE    Raymond [name]  ZOX:[id number] DOB: [birth year] DOA: 05/20/2016 PCP: Rudi Heap, MD    Brief Narrative:  83 year old male with a history of chronic respiratory failure on 4 L of oxygen, COPD, interstitial lung disease/pulmonary fibrosis, came to the hospital with worsening shortness of breath and found to have possible pneumonia, COPD exacerbation. Started on steroids, antibiotics and bronchodilators. Slowly improving. Pulmonology following.   Assessment & Plan:   Active Problems:   Hypothyroidism   Hyperlipidemia, acquired   PULMONARY FIBROSIS ILD POST INFLAMMATORY CHRONIC   CAP (community acquired pneumonia)   Pulmonary fibrosis (HCC)   Hyperglycemia, drug-induced   COPD with exacerbation (HCC)   Acute on chronic respiratory failure (HCC)   1. Acute on chronic respiratory failure with hypoxia. Suspect this is related to pneumonia as well as COPD/pulmonary fibrosis. Currently on baseline oxygen requirement of 4 L.  2. Probable community-acquired pneumonia. Treat with ceftriaxone and azithromycin. Influenza panel negative.  3. COPD exacerbation. Continue on IV steroids, antibiotics and bronchodilators. Continue pulmonary hygiene. He is still short of breath. Will need continued treatment.  4. Interstitial lung disease/pulmonary fibrosis. Continue mycophenolate and steroids. Pulmonology following. His baseline functional status appears to be quite poor.  5. Hyperglycemia. Felt to be related to steroids. Hemoglobin A1c 7.6. On sliding scale insulin and Lantus. Blood sugars have been stable.  6. Hypothyroidism. Continue Synthroid  7. Hyperlipidemia. Continue statin   DVT prophylaxis: Lovenox Code Status: Partial code, no CPR, no defibrillation Family Communication: Discussed with wife at the bedside Disposition Plan: Discharge home once improved   Consultants:   Pulmonology  Procedures:     Antimicrobials:   Ceftriaxone  2/22>>  Azithromycin 2/22>>   Subjective: Still feels very short of breath. Non productive cough  Objective: Vitals:   05/23/16 0642 05/23/16 0731 05/23/16 1143 05/23/16 1519  BP: 132/76   (!) 145/70  Pulse: 96   83  Resp: 18   18  Temp: 97.9 F (36.6 C)   98 F (36.7 C)  TempSrc: Oral   Oral  SpO2: 95% 96% 97% 97%  Weight:      Height:        Intake/Output Summary (Last 24 hours) at 05/23/16 1550 Last data filed at 05/23/16 1258  Gross per 24 hour  Intake            989.5 ml  Output             1350 ml  Net           -360.5 ml   Filed Weights   05/20/16 0914 05/20/16 1345 05/21/16 0500  Weight: 74.8 kg (165 lb) 75.2 kg (165 lb 12.6 oz) 76.7 kg (169 lb 1.5 oz)    Examination:  General exam: Appears calm and comfortable  Respiratory system: bilateral wheezing with rhonchi. Fair air movement bilaterally. Mild increased respiratory effort. Cardiovascular system: S1 & S2 heard, RRR. No JVD, murmurs, rubs, gallops or clicks. No pedal edema. Gastrointestinal system: Abdomen is nondistended, soft and nontender. No organomegaly or masses felt. Normal bowel sounds heard. Central nervous system: Alert and oriented. No focal neurological deficits. Extremities: Symmetric 5 x 5 power. Skin: No rashes, lesions or ulcers Psychiatry: Judgement and insight appear normal. Mood & affect appropriate.     Data Reviewed: I have personally reviewed following labs and imaging studies  CBC:  Recent Labs Lab 05/20/16 0920 05/20/16 1313 05/21/16 0426 05/23/16 0707  WBC 9.2  --  8.3 12.0*  NEUTROABS 7.5  --   --   --  HGB 11.8* 12.2* 11.4* 11.1*  HCT 36.8* 36.0* 36.2* 35.5*  MCV 95.3  --  95.0 95.9  PLT 184  --  194 208   Basic Metabolic Panel:  Recent Labs Lab 05/20/16 0920 05/20/16 1313 05/21/16 0426 05/23/16 0707  NA 134* 134* 135 138  K 4.2 3.5 4.1 3.9  CL 95* 93* 97* 96*  CO2 32  --  30 34*  GLUCOSE 278* 232* 244* 219*  BUN 14 13 16  27*  CREATININE 0.74 0.60*  0.62 0.69  CALCIUM 8.9  --  8.6* 8.8*   GFR: Estimated Creatinine Clearance: 65.9 mL/min (by C-G formula based on SCr of 0.69 mg/dL). Liver Function Tests:  Recent Labs Lab 05/20/16 0920  AST 23  ALT 22  ALKPHOS 46  BILITOT 0.9  PROT 6.2*  ALBUMIN 3.4*   No results for input(s): LIPASE, AMYLASE in the last 168 hours. No results for input(s): AMMONIA in the last 168 hours. Coagulation Profile: No results for input(s): INR, PROTIME in the last 168 hours. Cardiac Enzymes:  Recent Labs Lab 05/20/16 0920  TROPONINI <0.03   BNP (last 3 results) No results for input(s): PROBNP in the last 8760 hours. HbA1C:  Recent Labs  05/21/16 0426  HGBA1C 7.6*   CBG:  Recent Labs Lab 05/22/16 1114 05/22/16 1611 05/22/16 2119 05/23/16 0733 05/23/16 1121  GLUCAP 262* 182* 222* 229* 228*   Lipid Profile: No results for input(s): CHOL, HDL, LDLCALC, TRIG, CHOLHDL, LDLDIRECT in the last 72 hours. Thyroid Function Tests: No results for input(s): TSH, T4TOTAL, FREET4, T3FREE, THYROIDAB in the last 72 hours. Anemia Panel: No results for input(s): VITAMINB12, FOLATE, FERRITIN, TIBC, IRON, RETICCTPCT in the last 72 hours. Sepsis Labs:  Recent Labs Lab 05/20/16 0957 05/20/16 1323  LATICACIDVEN 3.10* 2.67*    Recent Results (from the past 240 hour(s))  Blood Culture (routine x 2)     Status: None (Preliminary result)   Collection Time: 05/20/16 10:10 AM  Result Value Ref Range Status   Specimen Description BLOOD RIGHT FOREARM  Final   Special Requests BOTTLES DRAWN AEROBIC ONLY 6CC  Final   Culture NO GROWTH 2 DAYS  Final   Report Status PENDING  Incomplete  Blood Culture (routine x 2)     Status: None (Preliminary result)   Collection Time: 05/20/16 10:20 AM  Result Value Ref Range Status   Specimen Description BLOOD LEFT ARM  Final   Special Requests BOTTLES DRAWN AEROBIC AND ANAEROBIC 6CC  Final   Culture NO GROWTH 2 DAYS  Final   Report Status PENDING  Incomplete   MRSA PCR Screening     Status: None   Collection Time: 05/20/16  1:52 PM  Result Value Ref Range Status   MRSA by PCR NEGATIVE NEGATIVE Final    Comment:        The GeneXpert MRSA Assay (FDA approved for NASAL specimens only), is one component of a comprehensive MRSA colonization surveillance program. It is not intended to diagnose MRSA infection nor to guide or monitor treatment for MRSA infections.          Radiology Studies: No results found.      Scheduled Meds: . aspirin EC  325 mg Oral Daily  . atorvastatin  10 mg Oral Daily  . [START ON 05/24/2016] azithromycin  500 mg Oral Daily  . cefTRIAXone (ROCEPHIN)  IV  1 g Intravenous Q24H  . citalopram  20 mg Oral Daily  . enoxaparin (LOVENOX) injection  40 mg Subcutaneous Q24H  .  guaiFENesin  1,200 mg Oral BID  . insulin aspart  0-20 Units Subcutaneous TID WC  . insulin aspart  0-5 Units Subcutaneous QHS  . insulin glargine  10 Units Subcutaneous Daily  . ipratropium-albuterol  3 mL Nebulization Q4H WA  . levothyroxine  12.5 mcg Oral Once per day on Mon Wed Fri  . levothyroxine  125 mcg Oral QAC breakfast  . methylPREDNISolone (SOLU-MEDROL) injection  60 mg Intravenous Q6H  . mycophenolate  1,000 mg Oral BID  . pantoprazole  40 mg Oral Daily  . terazosin  10 mg Oral QHS   Continuous Infusions: . sodium chloride 0.9 % 10 mL/hr (05/22/16 1600)     LOS: 3 days    Time spent: 25mins    Vincentina Sollers, MD [facility] Pager [phone]  If 7PM-7AM, please contact night-coverage [email or web address] Password [email or web address] 05/23/2016, 3:50 PM

## 2016-05-24 ENCOUNTER — Inpatient Hospital Stay (HOSPITAL_COMMUNITY): Payer: Medicare Other

## 2016-05-24 LAB — GLUCOSE, CAPILLARY
GLUCOSE-CAPILLARY: 222 mg/dL — AB (ref 65–99)
GLUCOSE-CAPILLARY: 261 mg/dL — AB (ref 65–99)
GLUCOSE-CAPILLARY: 137 mg/dL — AB (ref 65–99)
GLUCOSE-CAPILLARY: 87 mg/dL (ref 65–99)

## 2016-05-24 NOTE — Progress Notes (Signed)
Patient becomes very dyspneic very quickly if removed form oxygen. His saturations drops in to 80's in minutes.

## 2016-05-24 NOTE — Progress Notes (Addendum)
Subjective: He continues to be very short of breath. Not coughing anything up. No other new complaints. No chest pain. He says he is having some trouble swallowing.  Objective: Vital signs in last 24 hours: Temp:  [97.6 F (36.4 C)-98.2 F (36.8 C)] 97.6 F (36.4 C) (02/26 0619) Pulse Rate:  [83-87] 86 (02/26 0619) Resp:  [18] 18 (02/26 0619) BP: (145-149)/(61-89) 149/89 (02/26 0619) SpO2:  [90 %-97 %] 90 % (02/26 0747) Weight change:  Last BM Date: 05/23/16  Intake/Output from previous day: 02/25 0701 - 02/26 0700 In: 600 [I.V.:300; IV Piggyback:300] Out: 750 [Urine:625]  PHYSICAL EXAM General appearance: alert, cooperative, moderate distress and Short of breath after simply coughing Resp: rales bilaterally Cardio: regular rate and rhythm, S1, S2 normal, no murmur, click, rub or gallop GI: soft, non-tender; bowel sounds normal; no masses,  no organomegaly Extremities: extremities normal, atraumatic, no cyanosis or edema Skin warm and dry. Mucous membranes are moist  Lab Results:  Results for orders placed or performed during the hospital encounter of 05/20/16 (from the past 48 hour(s))  Glucose, capillary     Status: Abnormal   Collection Time: 05/22/16 11:14 AM  Result Value Ref Range   Glucose-Capillary 262 (H) 65 - 99 mg/dL   Comment 1 Notify RN    Comment 2 Document in Chart   Glucose, capillary     Status: Abnormal   Collection Time: 05/22/16  4:11 PM  Result Value Ref Range   Glucose-Capillary 182 (H) 65 - 99 mg/dL   Comment 1 Notify RN    Comment 2 Document in Chart   Glucose, capillary     Status: Abnormal   Collection Time: 05/22/16  9:19 PM  Result Value Ref Range   Glucose-Capillary 222 (H) 65 - 99 mg/dL   Comment 1 Notify RN    Comment 2 Document in Chart   CBC     Status: Abnormal   Collection Time: 05/23/16  7:07 AM  Result Value Ref Range   WBC 12.0 (H) 4.0 - 10.5 K/uL   RBC 3.70 (L) 4.22 - 5.81 MIL/uL   Hemoglobin 11.1 (L) 13.0 - 17.0 g/dL    HCT 83.8 (L) 92.8 - 52.0 %   MCV 95.9 78.0 - 100.0 fL   MCH 30.0 26.0 - 34.0 pg   MCHC 31.3 30.0 - 36.0 g/dL   RDW 16.6 41.8 - 30.4 %   Platelets 208 150 - 400 K/uL  Basic metabolic panel     Status: Abnormal   Collection Time: 05/23/16  7:07 AM  Result Value Ref Range   Sodium 138 135 - 145 mmol/L   Potassium 3.9 3.5 - 5.1 mmol/L   Chloride 96 (L) 101 - 111 mmol/L   CO2 34 (H) 22 - 32 mmol/L   Glucose, Bld 219 (H) 65 - 99 mg/dL   BUN 27 (H) 6 - 20 mg/dL   Creatinine, Ser 3.06 0.61 - 1.24 mg/dL   Calcium 8.8 (L) 8.9 - 10.3 mg/dL   GFR calc non Af Amer >60 >60 mL/min   GFR calc Af Amer >60 >60 mL/min    Comment: (NOTE) The eGFR has been calculated using the CKD EPI equation. This calculation has not been validated in all clinical situations. eGFR's persistently <60 mL/min signify possible Chronic Kidney Disease.    Anion gap 8 5 - 15  Glucose, capillary     Status: Abnormal   Collection Time: 05/23/16  7:33 AM  Result Value Ref Range   Glucose-Capillary  229 (H) 65 - 99 mg/dL   Comment 1 Notify RN    Comment 2 Document in Chart   Glucose, capillary     Status: Abnormal   Collection Time: 05/23/16 11:21 AM  Result Value Ref Range   Glucose-Capillary 228 (H) 65 - 99 mg/dL   Comment 1 Notify RN    Comment 2 Document in Chart   Glucose, capillary     Status: Abnormal   Collection Time: 05/23/16  4:05 PM  Result Value Ref Range   Glucose-Capillary 194 (H) 65 - 99 mg/dL   Comment 1 Notify RN    Comment 2 Document in Chart   Glucose, capillary     Status: Abnormal   Collection Time: 05/23/16  8:47 PM  Result Value Ref Range   Glucose-Capillary 291 (H) 65 - 99 mg/dL   Comment 1 Notify RN    Comment 2 Document in Chart   Glucose, capillary     Status: Abnormal   Collection Time: 05/24/16  7:45 AM  Result Value Ref Range   Glucose-Capillary 222 (H) 65 - 99 mg/dL   Comment 1 Notify RN    Comment 2 Document in Chart     ABGS No results for input(s): PHART, PO2ART, TCO2,  HCO3 in the last 72 hours.  Invalid input(s): PCO2 CULTURES Recent Results (from the past 240 hour(s))  Blood Culture (routine x 2)     Status: None (Preliminary result)   Collection Time: 05/20/16 10:10 AM  Result Value Ref Range Status   Specimen Description BLOOD RIGHT FOREARM  Final   Special Requests BOTTLES DRAWN AEROBIC ONLY 6CC  Final   Culture NO GROWTH 2 DAYS  Final   Report Status PENDING  Incomplete  Blood Culture (routine x 2)     Status: None (Preliminary result)   Collection Time: 05/20/16 10:20 AM  Result Value Ref Range Status   Specimen Description BLOOD LEFT ARM  Final   Special Requests BOTTLES DRAWN AEROBIC AND ANAEROBIC 6CC  Final   Culture NO GROWTH 2 DAYS  Final   Report Status PENDING  Incomplete  MRSA PCR Screening     Status: None   Collection Time: 05/20/16  1:52 PM  Result Value Ref Range Status   MRSA by PCR NEGATIVE NEGATIVE Final    Comment:        The GeneXpert MRSA Assay (FDA approved for NASAL specimens only), is one component of a comprehensive MRSA colonization surveillance program. It is not intended to diagnose MRSA infection nor to guide or monitor treatment for MRSA infections.    Studies/Results: No results found.  Medications:  Prior to Admission:  Prescriptions Prior to Admission  Medication Sig Dispense Refill Last Dose  . albuterol (PROVENTIL) (2.5 MG/3ML) 0.083% nebulizer solution Take 3 mLs (2.5 mg total) by nebulization every 6 (six) hours as needed for wheezing or shortness of breath. 75 mL 12 05/19/2016 at Unknown time  . amoxicillin-clavulanate (AUGMENTIN) 875-125 MG tablet Take 1 tablet by mouth 2 (two) times daily. 60 tablet 1 05/19/2016 at Unknown time  . aspirin EC 325 MG tablet Take 325 mg by mouth daily.   05/20/2016 at Unknown time  . atorvastatin (LIPITOR) 10 MG tablet Take 1 tablet (10 mg total) by mouth daily. 90 tablet 3 05/19/2016 at Unknown time  . Cholecalciferol (VITAMIN D) 2000 UNITS CAPS Take 2,000-4,000  Units by mouth daily. Takes 2000 units everyday except for Fri, Sat, and Sun patient takes 4000 units.   05/20/2016 at  Unknown time  . citalopram (CELEXA) 20 MG tablet Take 1 tablet (20 mg total) by mouth daily. 90 tablet 3 05/20/2016 at Unknown time  . levothyroxine (SYNTHROID, LEVOTHROID) 125 MCG tablet Take 1 tablet (125 mcg total) by mouth daily.   05/20/2016 at Unknown time  . levothyroxine (SYNTHROID, LEVOTHROID) 25 MCG tablet Take 12.5 mcg by mouth. Take Monday, Wednesday and Friday with the 12mg   05/20/2016 at Unknown time  . mycophenolate (CELLCEPT) 500 MG tablet Take 2 tablets by mouth 2 (two) times daily.   05/20/2016 at Unknown time  . omeprazole (PRILOSEC) 20 MG capsule Take 1 capsule (20 mg total) by mouth every evening. 90 capsule 3 05/19/2016 at Unknown time  . predniSONE (DELTASONE) 10 MG tablet Take 15 mg by mouth.    05/19/2016 at Unknown time  . terazosin (HYTRIN) 10 MG capsule Take 1 capsule (10 mg total) by mouth at bedtime. 90 capsule 3 05/19/2016 at Unknown time   Scheduled: . aspirin EC  325 mg Oral Daily  . atorvastatin  10 mg Oral Daily  . azithromycin  500 mg Oral Daily  . cefTRIAXone (ROCEPHIN)  IV  1 g Intravenous Q24H  . citalopram  20 mg Oral Daily  . enoxaparin (LOVENOX) injection  40 mg Subcutaneous Q24H  . guaiFENesin  1,200 mg Oral BID  . insulin aspart  0-20 Units Subcutaneous TID WC  . insulin aspart  0-5 Units Subcutaneous QHS  . insulin glargine  10 Units Subcutaneous Daily  . ipratropium-albuterol  3 mL Nebulization Q4H WA  . levothyroxine  12.5 mcg Oral Once per day on Mon Wed Fri  . levothyroxine  125 mcg Oral QAC breakfast  . methylPREDNISolone (SOLU-MEDROL) injection  60 mg Intravenous Q6H  . mycophenolate  1,000 mg Oral BID  . pantoprazole  40 mg Oral Daily  . terazosin  10 mg Oral QHS   Continuous: . sodium chloride 0.9 % 10 mL/hr (05/22/16 1600)   PHCW:[id number]**OR** acetaminophen, albuterol, ondansetron **OR** ondansetron (ZOFRAN)  IV  Assesment: He was admitted with acute on chronic respiratory failure with community-acquired pneumonia and pulmonary fibrosis. He is still very short of breath with minimal exertion. He reports some difficulty swallowing. Active Problems:   Hypothyroidism   Hyperlipidemia, acquired   PULMONARY FIBROSIS ILD POST INFLAMMATORY CHRONIC   CAP (community acquired pneumonia)   [place]   Hyperglycemia, drug-induced   [place]   [place]    Plan: Speech evaluation continue with current treatments.may need GI    LOS: 4 days   Kahne Helfand L 05/24/2016, 8:23 AM

## 2016-05-24 NOTE — Progress Notes (Signed)
Modified Barium Swallow Progress Note  Patient Details  Name: Raymond [name] MRN: [id number] Date of Birth: 30-Nov-1929  Today's Date: 05/24/2016  Modified Barium Swallow completed.  Full report located under Chart Review in the Imaging Section.  Brief recommendations include the following:  Clinical Impression     Pt seen in lateral and AP view for MBSS and assessed with the following barium tinged textures/consistencies: thin, puree, regular, barium tablet, and nectar-thick liquids. Oropharyngeal swallow essentially WNL for age with swallow trigger at the level of the valleculae across consistencies and textures. Pt with mild lingual residuals after primary swallow, however he independently swallows again to clear any residuals that have pooled in valleculae. Pt with one episode of trace penetration (underepiglottic coating) on second swallow of thin from pooled residuals. Pt was cued to clear throat and it was no longer visualized. Of note, Pt has a prominent cricopharyngeus (CP bar) with what appeared to be an outpouching just above (hypopharyngeal pouch), however no radiologist present to confirm presence of Zenker's diverticulum. Pt was turned AP and it was again visualized. Pharyngeal transit appeared to be more to the left once past the valleculae (mild vallecular pooling noted on the right when assessed with NTL). Pt was presented with NTL to allow for greater contrast and visualization which yielded collection of the tail end of bolus and backflow (only slight) into pyriforms. No overt or gross amount of backflow noted.   Recommend D3/mech soft with thin liquids with aspiration/reflux precautions: Pt to sit fully upright for all po and remain up for 30 minutes after, swallow 2x for each sip of liquid and clear throat/cough intermittently; Pt may wish to avoid dry, crumbly foods. He may have difficulty with some medications. SLP to follow up x1 for pt/family education.     Swallow  Evaluation Recommendations   Recommended Consults: Consider esophageal assessment (consider consult for CP bar)   SLP Diet Recommendations: Dysphagia 3 (Mech soft) solids;Thin liquid   Liquid Administration via: Cup   Medication Administration: Whole meds with liquid   Supervision: Patient able to self feed           Oral Care Recommendations: Oral care BID   Other Recommendations: Clarify dietary restrictions   Thank you,  Havery MorosDabney Porter, CCC-SLP [phone]  PORTER,DABNEY 05/24/2016,5:48 PM

## 2016-05-24 NOTE — Evaluation (Signed)
Clinical/Bedside Swallow Evaluation Patient Details  Name: Raymond SkainsWilliam H Chamblee MRN: [id number] Date of Birth: 07/22/1929  Today's Date: 05/24/2016 Time: SLP Start Time (ACUTE ONLY): 1145 SLP Stop Time (ACUTE ONLY): 1215 SLP Time Calculation (min) (ACUTE ONLY): 30 min  Past Medical History:  Past Medical History:  Diagnosis Date  . Achilles tendon rupture   . Arthritis   . BPH (benign prostatic hyperplasia)   . CAD (coronary artery disease)   . Cataracts, bilateral   . COPD (chronic obstructive pulmonary disease) (HCC)   . GERD (gastroesophageal reflux disease)   . Hyperlipemia   . Hypothyroid   . ILD (interstitial lung disease) (HCC)   . Prostate hyperplasia, benign localized, with urinary obstruction    Past Surgical History:  Past Surgical History:  Procedure Laterality Date  . ACHILLES TENDON SURGERY  08/20/2011   Procedure: ACHILLES TENDON REPAIR;  Surgeon: Sherri RadPaul A Bednarz, MD;  Location: [facility];  Service: Orthopedics;  Laterality: Right;  Right primary achilles tendon repair, gastroc slide  . APPENDECTOMY    . CHOLECYSTECTOMY    . COLONOSCOPY    . EYE SURGERY     cataracts   HPI:  83 year old male with a history of chronic respiratory failure on 4 L of oxygen, COPD, interstitial lung disease/pulmonary fibrosis, came to the hospital with worsening shortness of breath and found to have possible pneumonia, COPD exacerbation. Started on steroids, antibiotics and bronchodilators. Slowly improving. Pulmonology following. MD ordered BSE due to concerns about aspiration.   Assessment / Plan / Recommendation Clinical Impression  Pt presented with ice chips, puree, and regular dry texture. Swallow timing appears WFL. Pt presenting with increased WOB and delayed cough after po trials. Pt reports globus sensation with difficulty of clearing dry textures and large pills. Pt presents with COPD increasing aspiration risk, secondary to respiratory difficulties impacting  coordination of swallow. Recommend MBSS for visualization of pharyngeal musculature and current fxn.   SLP Visit Diagnosis: Dysphagia, unspecified (R13.10)    Aspiration Risk  Mild aspiration risk;Moderate aspiration risk    Diet Recommendation     Medication Administration: Whole meds with liquid    Other  Recommendations Recommended Consults: Other (Comment) (MBS scheduled)   Follow up Recommendations        Frequency and Duration            Prognosis Prognosis for Safe Diet Advancement: Fair      Swallow Study   General Date of Onset: 05/20/16 HPI: 83 year old male with a history of chronic respiratory failure on 4 L of oxygen, COPD, interstitial lung disease/pulmonary fibrosis, came to the hospital with worsening shortness of breath and found to have possible pneumonia, COPD exacerbation. Started on steroids, antibiotics and bronchodilators. Slowly improving. Pulmonology following. MD ordered BSE due to concerns about aspiration. Type of Study: Bedside Swallow Evaluation Previous Swallow Assessment: None on record Diet Prior to this Study: Regular Temperature Spikes Noted: No Respiratory Status: Nasal cannula History of Recent Intubation: No Behavior/Cognition: Alert;Cooperative Oral Cavity Assessment: Within Functional Limits Oral Care Completed by SLP: No Oral Cavity - Dentition: Other (Comment) (partial on rt side) Vision: Functional for self-feeding Self-Feeding Abilities: Able to feed self Patient Positioning: Upright in bed Baseline Vocal Quality: Breathy (with notable increased WOB) Volitional Cough: Strong Volitional Swallow: Able to elicit    Oral/Motor/Sensory Function Overall Oral Motor/Sensory Function: Mild impairment (rt side weakness of buccal muscles and lingual strength. ) Facial ROM: Reduced right Facial Symmetry: Within Functional Limits Facial Strength: Within Functional Limits  Facial Sensation: Within Functional Limits Lingual ROM: Within  Functional Limits Lingual Symmetry: Within Functional Limits Lingual Strength: Reduced Lingual Sensation: Within Functional Limits Velum: Within Functional Limits Mandible: Within Functional Limits   Ice Chips Ice chips: Within functional limits Presentation: Spoon   Thin Liquid Thin Liquid: Within functional limits Presentation: Cup;Straw;Self Fed    Nectar Thick Nectar Thick Liquid: Not tested   Honey Thick Honey Thick Liquid: Not tested   Puree Puree: Within functional limits Presentation: Spoon;Self Fed Other Comments: delayed cough following self feeding. Suspect phryngeal residue   Solid   GO   Olene Craven SLP Graduate Clinician  Solid: Within functional limits Presentation: Self Fed Other Comments: Increased WOB with prolonged mastication        [employer] 05/24/2016,3:13 PM

## 2016-05-24 NOTE — Progress Notes (Signed)
Patient has rested well have not awakened for nebulizer since midnight. Will restart in am , unless patient calls.

## 2016-05-24 NOTE — Progress Notes (Signed)
Inpatient Diabetes Program Recommendations  AACE/ADA: New Consensus Statement on Inpatient Glycemic Control (2015)  Target Ranges:  Prepandial:   less than 140 mg/dL      Peak postprandial:   less than 180 mg/dL (1-2 hours)      Critically ill patients:  140 - 180 mg/dL   Lab Results  Component Value Date   GLUCAP 222 ([name]) 05/24/2016   HGBA1C 7.6 ([name]) 05/21/2016    Review of Glycemic Control Results for Raymond [name], Raymond [name] (MRN [id number]) as of 05/24/2016 08:40  Ref. Range 05/23/2016 07:33 05/23/2016 11:21 05/23/2016 16:05 05/23/2016 20:47 05/24/2016 07:45  Glucose-Capillary Latest Ref Range: 65 - 99 mg/dL 829229 ([name]) 562228 ([name]) 130194 ([name]) 291 ([name]) 222 ([name])   Diabetes history: No Outpatient Diabetes medications: NA Current orders for Inpatient glycemic control: Lantus 10 units,Novolog 0-20 units TID with meals, Novolog 0-5 units QHS  Inpatient Diabetes Program Recommendations: Insulin - Basal: If steroids are continued as ordered, please consider increasing Lantus 12 units Q24H (based on 76.7 kg x 0.15 units). Please note Lantus  will need to be adjusted as steroids are tapered.  Thank you, Billy FischerJudy E. Sun Wilensky, RN, MSN, CDE Inpatient Glycemic Control Team Team Pager [phone] (8am-5pm) 05/24/2016 8:43 AM

## 2016-05-24 NOTE — Progress Notes (Signed)
PROGRESS NOTE    Raymond [name]  ZOX:[id number] DOB: 01/25/1930 DOA: 05/20/2016 PCP: Rudi Heap, MD    Brief Narrative:  83 year old male with a history of chronic respiratory failure on 4 L of oxygen, COPD, interstitial lung disease/pulmonary fibrosis, came to the hospital with worsening shortness of breath and found to have possible pneumonia, COPD exacerbation. Started on steroids, antibiotics and bronchodilators. Slowly improving. Pulmonology following.   Assessment & Plan:   Active Problems:   Hypothyroidism   Hyperlipidemia, acquired   PULMONARY FIBROSIS ILD POST INFLAMMATORY CHRONIC   CAP (community acquired pneumonia)   Pulmonary fibrosis (HCC)   Hyperglycemia, drug-induced   COPD with exacerbation (HCC)   Acute on chronic respiratory failure (HCC)   1. Acute on chronic respiratory failure with hypoxia. Suspect this is related to pneumonia as well as COPD/pulmonary fibrosis. Currently on baseline oxygen requirement of 4 L. patient likely has a component of dysphagia. Speech therapy following and plans on modified barium swallow.  2. Probable community-acquired pneumonia. Treat with ceftriaxone and azithromycin. Influenza panel negative.  3. COPD exacerbation. Continue on IV steroids, antibiotics and bronchodilators. Continue pulmonary hygiene. He is still short of breath. Will need continued treatment.  4. Interstitial lung disease/pulmonary fibrosis. Continue mycophenolate and steroids. Pulmonology following. His baseline functional status appears to be quite poor.  5. Hyperglycemia. Felt to be related to steroids. Hemoglobin A1c 7.6. On sliding scale insulin and Lantus. Blood sugars have been stable.  6. Hypothyroidism. Continue Synthroid  7. Hyperlipidemia. Continue statin   DVT prophylaxis: Lovenox Code Status: Partial code, no CPR, no defibrillation Family Communication: Discussed with wife at the bedside Disposition Plan: Discharge home once  improved   Consultants:   Pulmonology  Procedures:     Antimicrobials:   Ceftriaxone 2/22>>  Azithromycin 2/22>>   Subjective: Has mostly nonproductive cough. Continues to feel short of breath and wheezing.  Objective: Vitals:   05/24/16 0747 05/24/16 1140 05/24/16 1457 05/24/16 1624  BP:   (!) 148/64   Pulse:   82   Resp:   18   Temp:   97.8 F (36.6 C)   TempSrc:   Oral   SpO2: 90% 90% 100% 98%  Weight:      Height:        Intake/Output Summary (Last 24 hours) at 05/24/16 1713 Last data filed at 05/24/16 1603  Gross per 24 hour  Intake            64.33 ml  Output              950 ml  Net          -885.67 ml   Filed Weights   05/20/16 0914 05/20/16 1345 05/21/16 0500  Weight: 74.8 kg (165 lb) 75.2 kg (165 lb 12.6 oz) 76.7 kg (169 lb 1.5 oz)    Examination:  General exam: Appears calm and comfortable  Respiratory system: Diminished breath sounds with bilateral wheezes. Fair air movement bilaterally. Mild increased respiratory effort. Cardiovascular system: S1 & S2 heard, RRR. No JVD, murmurs, rubs, gallops or clicks. No pedal edema. Gastrointestinal system: Abdomen is nondistended, soft and nontender. No organomegaly or masses felt. Normal bowel sounds heard. Central nervous system: Alert and oriented. No focal neurological deficits. Extremities: Symmetric 5 x 5 power. Skin: No rashes, lesions or ulcers Psychiatry: Judgement and insight appear normal. Mood & affect appropriate.     Data Reviewed: I have personally reviewed following labs and imaging studies  CBC:  Recent Labs Lab 05/20/16  0920 05/20/16 1313 05/21/16 0426 05/23/16 0707  WBC 9.2  --  8.3 12.0*  NEUTROABS 7.5  --   --   --   HGB 11.8* 12.2* 11.4* 11.1*  HCT 36.8* 36.0* 36.2* 35.5*  MCV 95.3  --  95.0 95.9  PLT 184  --  194 208   Basic Metabolic Panel:  Recent Labs Lab 05/20/16 0920 05/20/16 1313 05/21/16 0426 05/23/16 0707  NA 134* 134* 135 138  K 4.2 3.5 4.1 3.9  CL  95* 93* 97* 96*  CO2 32  --  30 34*  GLUCOSE 278* 232* 244* 219*  BUN 14 13 16  27*  CREATININE 0.74 0.60* 0.62 0.69  CALCIUM 8.9  --  8.6* 8.8*   GFR: Estimated Creatinine Clearance: 65.9 mL/min (by C-G formula based on SCr of 0.69 mg/dL). Liver Function Tests:  Recent Labs Lab 05/20/16 0920  AST 23  ALT 22  ALKPHOS 46  BILITOT 0.9  PROT 6.2*  ALBUMIN 3.4*   No results for input(s): LIPASE, AMYLASE in the last 168 hours. No results for input(s): AMMONIA in the last 168 hours. Coagulation Profile: No results for input(s): INR, PROTIME in the last 168 hours. Cardiac Enzymes:  Recent Labs Lab 05/20/16 0920  TROPONINI <0.03   BNP (last 3 results) No results for input(s): PROBNP in the last 8760 hours. HbA1C: No results for input(s): HGBA1C in the last 72 hours. CBG:  Recent Labs Lab 05/23/16 1605 05/23/16 2047 05/24/16 0745 05/24/16 1210 05/24/16 1606  GLUCAP 194* 291* 222* 261* 137*   Lipid Profile: No results for input(s): CHOL, HDL, LDLCALC, TRIG, CHOLHDL, LDLDIRECT in the last 72 hours. Thyroid Function Tests: No results for input(s): TSH, T4TOTAL, FREET4, T3FREE, THYROIDAB in the last 72 hours. Anemia Panel: No results for input(s): VITAMINB12, FOLATE, FERRITIN, TIBC, IRON, RETICCTPCT in the last 72 hours. Sepsis Labs:  Recent Labs Lab 05/20/16 0957 05/20/16 1323  LATICACIDVEN 3.10* 2.67*    Recent Results (from the past 240 hour(s))  Blood Culture (routine x 2)     Status: None (Preliminary result)   Collection Time: 05/20/16 10:10 AM  Result Value Ref Range Status   Specimen Description BLOOD RIGHT FOREARM  Final   Special Requests BOTTLES DRAWN AEROBIC ONLY 6CC  Final   Culture NO GROWTH 4 DAYS  Final   Report Status PENDING  Incomplete  Blood Culture (routine x 2)     Status: None (Preliminary result)   Collection Time: 05/20/16 10:20 AM  Result Value Ref Range Status   Specimen Description BLOOD LEFT ARM  Final   Special Requests BOTTLES  DRAWN AEROBIC AND ANAEROBIC 6CC  Final   Culture NO GROWTH 4 DAYS  Final   Report Status PENDING  Incomplete  MRSA PCR Screening     Status: None   Collection Time: 05/20/16  1:52 PM  Result Value Ref Range Status   MRSA by PCR NEGATIVE NEGATIVE Final    Comment:        The GeneXpert MRSA Assay (FDA approved for NASAL specimens only), is one component of a comprehensive MRSA colonization surveillance program. It is not intended to diagnose MRSA infection nor to guide or monitor treatment for MRSA infections.          Radiology Studies: No results found.      Scheduled Meds: . aspirin EC  325 mg Oral Daily  . atorvastatin  10 mg Oral Daily  . azithromycin  500 mg Oral Daily  . cefTRIAXone (ROCEPHIN)  IV  1 g Intravenous Q24H  . citalopram  20 mg Oral Daily  . enoxaparin (LOVENOX) injection  40 mg Subcutaneous Q24H  . guaiFENesin  1,200 mg Oral BID  . insulin aspart  0-20 Units Subcutaneous TID WC  . insulin aspart  0-5 Units Subcutaneous QHS  . insulin glargine  10 Units Subcutaneous Daily  . ipratropium-albuterol  3 mL Nebulization Q4H WA  . levothyroxine  12.5 mcg Oral Once per day on Mon Wed Fri  . levothyroxine  125 mcg Oral QAC breakfast  . methylPREDNISolone (SOLU-MEDROL) injection  60 mg Intravenous Q6H  . mycophenolate  1,000 mg Oral BID  . pantoprazole  40 mg Oral Daily  . terazosin  10 mg Oral QHS   Continuous Infusions: . sodium chloride 0.9 % 10 mL/hr (05/24/16 0947)     LOS: 4 days    Time spent: 25mins    MEMON,JEHANZEB, MD [facility] Pager [phone]  If 7PM-7AM, please contact night-coverage [email or web address] Password [email or web address] 05/24/2016, 5:13 PM

## 2016-05-25 LAB — GLUCOSE, CAPILLARY
GLUCOSE-CAPILLARY: 149 mg/dL — AB (ref 65–99)
Glucose-Capillary: 114 mg/dL — ABNORMAL HIGH (ref 65–99)
Glucose-Capillary: 331 mg/dL — ABNORMAL HIGH (ref 65–99)
Glucose-Capillary: 198 mg/dL — ABNORMAL HIGH (ref 65–99)
Glucose-Capillary: 155 mg/dL — ABNORMAL HIGH (ref 65–99)

## 2016-05-25 LAB — CULTURE, BLOOD (ROUTINE X 2)
Culture: NO GROWTH
Culture: NO GROWTH

## 2016-05-25 MED ORDER — ACETYLCYSTEINE 20 % IN SOLN
3.0000 mL | Freq: Three times a day (TID) | RESPIRATORY_TRACT | Status: DC
  Administered 2016-05-25 – 2016-05-26 (×4): 3 mL via RESPIRATORY_TRACT
  Administered 2016-05-27: 4 mL via RESPIRATORY_TRACT
  Administered 2016-05-27: 16:00:00 via RESPIRATORY_TRACT
  Administered 2016-05-27 – 2016-05-29 (×5): 3 mL via RESPIRATORY_TRACT
  Administered 2016-05-29: 4 mL via RESPIRATORY_TRACT
  Administered 2016-05-29: 3 mL via RESPIRATORY_TRACT
  Filled 2016-05-25 (×13): qty 4

## 2016-05-25 NOTE — Progress Notes (Signed)
Subjective: He is still very short of breath with minimal exertion. No other new complaints. He's coughing some. He was able to cough up some sputum yesterday.  Objective: Vital signs in last 24 hours: Temp:  [97.4 F (36.3 C)-97.8 F (36.6 C)] 97.6 F (36.4 C) (02/27 0515) Pulse Rate:  [81-85] 81 (02/27 0515) Resp:  [18] 18 (02/27 0515) BP: (142-148)/(64-70) 142/69 (02/27 0515) SpO2:  [90 %-100 %] 97 % (02/27 0515) Weight change:  Last BM Date: 05/23/16  Intake/Output from previous day: 02/26 0701 - 02/27 0700 In: 240 [P.O.:240] Out: 750 [Urine:750]  PHYSICAL EXAM General appearance: alert, cooperative and moderate distress Resp: Rhonchi and crackles bilaterally Cardio: regular rate and rhythm, S1, S2 normal, no murmur, click, rub or gallop GI: soft, non-tender; bowel sounds normal; no masses,  no organomegaly Extremities: Extensive bruising of extremities, chronic Skin warm and dry. Mucous membranes are moist  Lab Results:  Results for orders placed or performed during the hospital encounter of 05/20/16 (from the past 48 hour(s))  Glucose, capillary     Status: Abnormal   Collection Time: 05/23/16 11:21 AM  Result Value Ref Range   Glucose-Capillary 228 (H) 65 - 99 mg/dL   Comment 1 Notify RN    Comment 2 Document in Chart   Glucose, capillary     Status: Abnormal   Collection Time: 05/23/16  4:05 PM  Result Value Ref Range   Glucose-Capillary 194 (H) 65 - 99 mg/dL   Comment 1 Notify RN    Comment 2 Document in Chart   Glucose, capillary     Status: Abnormal   Collection Time: 05/23/16  8:47 PM  Result Value Ref Range   Glucose-Capillary 291 (H) 65 - 99 mg/dL   Comment 1 Notify RN    Comment 2 Document in Chart   Glucose, capillary     Status: Abnormal   Collection Time: 05/24/16  7:45 AM  Result Value Ref Range   Glucose-Capillary 222 (H) 65 - 99 mg/dL   Comment 1 Notify RN    Comment 2 Document in Chart   Glucose, capillary     Status: Abnormal    Collection Time: 05/24/16 12:10 PM  Result Value Ref Range   Glucose-Capillary 261 (H) 65 - 99 mg/dL   Comment 1 Notify RN    Comment 2 Document in Chart   Glucose, capillary     Status: Abnormal   Collection Time: 05/24/16  4:06 PM  Result Value Ref Range   Glucose-Capillary 137 (H) 65 - 99 mg/dL  Glucose, capillary     Status: None   Collection Time: 05/24/16  8:56 PM  Result Value Ref Range   Glucose-Capillary 87 65 - 99 mg/dL  Glucose, capillary     Status: Abnormal   Collection Time: 05/25/16  2:12 AM  Result Value Ref Range   Glucose-Capillary 114 (H) 65 - 99 mg/dL   Comment 1 Notify RN    Comment 2 Document in Chart   Glucose, capillary     Status: Abnormal   Collection Time: 05/25/16  7:38 AM  Result Value Ref Range   Glucose-Capillary 149 (H) 65 - 99 mg/dL    ABGS No results for input(s): PHART, PO2ART, TCO2, HCO3 in the last 72 hours.  Invalid input(s): PCO2 CULTURES Recent Results (from the past 240 hour(s))  Blood Culture (routine x 2)     Status: None (Preliminary result)   Collection Time: 05/20/16 10:10 AM  Result Value Ref Range Status  Specimen Description BLOOD RIGHT FOREARM  Final   Special Requests BOTTLES DRAWN AEROBIC ONLY 6CC  Final   Culture NO GROWTH 4 DAYS  Final   Report Status PENDING  Incomplete  Blood Culture (routine x 2)     Status: None (Preliminary result)   Collection Time: 05/20/16 10:20 AM  Result Value Ref Range Status   Specimen Description BLOOD LEFT ARM  Final   Special Requests BOTTLES DRAWN AEROBIC AND ANAEROBIC 6CC  Final   Culture NO GROWTH 4 DAYS  Final   Report Status PENDING  Incomplete  MRSA PCR Screening     Status: None   Collection Time: 05/20/16  1:52 PM  Result Value Ref Range Status   MRSA by PCR NEGATIVE NEGATIVE Final    Comment:        The GeneXpert MRSA Assay (FDA approved for NASAL specimens only), is one component of a comprehensive MRSA colonization surveillance program. It is not intended to  diagnose MRSA infection nor to guide or monitor treatment for MRSA infections.    Studies/Results: Dg Swallowing Func-speech Pathology  Result Date: 05/24/2016 Objective Swallowing Evaluation: Type of Study: MBS-Modified Barium Swallow Study Patient Details Name: SYMIR MAH MRN: [id number] Date of Birth: Mar 23, 1930 Today's Date: 05/24/2016 Time: SLP Start Time (ACUTE ONLY): 1305-SLP Stop Time (ACUTE ONLY): 1335 SLP Time Calculation (min) (ACUTE ONLY): 30 min Past Medical History: Past Medical History: Diagnosis Date . Achilles tendon rupture  . Arthritis  . BPH (benign prostatic hyperplasia)  . CAD (coronary artery disease)  . Cataracts, bilateral  . COPD (chronic obstructive pulmonary disease) (HCC)  . GERD (gastroesophageal reflux disease)  . Hyperlipemia  . Hypothyroid  . ILD (interstitial lung disease) (HCC)  . Prostate hyperplasia, benign localized, with urinary obstruction  Past Surgical History: Past Surgical History: Procedure Laterality Date . ACHILLES TENDON SURGERY  08/20/2011  Procedure: ACHILLES TENDON REPAIR;  Surgeon: Sherri Rad, MD;  Location: [facility];  Service: Orthopedics;  Laterality: Right;  Right primary achilles tendon repair, gastroc slide . APPENDECTOMY   . CHOLECYSTECTOMY   . COLONOSCOPY   . EYE SURGERY    cataracts HPI: 83 year old male with a history of chronic respiratory failure on 4 L of oxygen, COPD, interstitial lung disease/pulmonary fibrosis, came to the hospital with worsening shortness of breath and found to have possible pneumonia, COPD exacerbation. Started on steroids, antibiotics and bronchodilators. Slowly improving. Pulmonology following. MD ordered BSE due to concerns about aspiration. Subjective: "I have trouble swallowing big pills and dry foods." Assessment / Plan / Recommendation CHL IP CLINICAL IMPRESSIONS 05/24/2016 Clinical Impression Pt seen in lateral and AP view for MBSS and assessed with the following barium tinged  textures/consistencies: thin, puree, regular, barium tablet, and nectar-thick liquids. Oropharyngeal swallow essentially WNL for age with swallow trigger at the level of the valleculae across consistencies and textures. Pt with mild lingual residuals after primary swallow, however he independently swallows again to clear any residuals that have pooled in valleculae. Pt with one episode of trace penetration (underepiglottic coating) on second swallow of thin from pooled residuals. Pt was cued to clear throat and it was no longer visualized. Of note, Pt has a prominent cricopharyngeus (CP bar) with what appeared to be an outpouching just above (hypopharyngeal pouch), however no radiologist present to confirm presence of Zenker's diverticulum. Pt was turned AP and it was again visualized. Pharyngeal transit appeared to be more to the left once past the valleculae (mild vallecular pooling noted on the  right when assessed with NTL). Pt was presented with NTL to allow for greater contrast and visualization which yielded collection of the tail end of bolus and backflow (only slight) into pyriforms. No overt or gross amount of backflow noted. Recommend D3/mech soft with thin liquids with aspiration/reflux precautions: Pt to sit fully upright for all po and remain up for 30 minutes after, swallow 2x for each sip of liquid and clear throat/cough intermittently; Pt may wish to avoid dry, crumbly foods. He may have difficulty with some medications. SLP to follow up x1 for pt/family education.  SLP Visit Diagnosis Dysphagia, pharyngoesophageal phase (R13.14) Attention and concentration deficit following -- Frontal lobe and executive function deficit following -- Impact on safety and function Mild aspiration risk;Moderate aspiration risk   CHL IP TREATMENT RECOMMENDATION 05/24/2016 Treatment Recommendations Therapy as outlined in treatment plan below   Prognosis 05/24/2016 Prognosis for Safe Diet Advancement Fair Barriers to Reach  Goals Severity of deficits Barriers/Prognosis Comment CP bar CHL IP DIET RECOMMENDATION 05/24/2016 SLP Diet Recommendations Dysphagia 3 (Mech soft) solids;Thin liquid Liquid Administration via Cup Medication Administration Whole meds with liquid Compensations Multiple dry swallows after each bite/sip Postural Changes Remain semi-upright after after feeds/meals (Comment);Seated upright at 90 degrees   CHL IP OTHER RECOMMENDATIONS 05/24/2016 Recommended Consults Consider esophageal assessment Oral Care Recommendations Oral care BID Other Recommendations Clarify dietary restrictions   CHL IP FOLLOW UP RECOMMENDATIONS 05/24/2016 Follow up Recommendations None   CHL IP FREQUENCY AND DURATION 05/24/2016 Speech Therapy Frequency (ACUTE ONLY) min 1 x/week Treatment Duration 1 week      CHL IP ORAL PHASE 05/24/2016 Oral Phase Impaired Oral - Pudding Teaspoon -- Oral - Pudding Cup -- Oral - Honey Teaspoon -- Oral - Honey Cup -- Oral - Nectar Teaspoon -- Oral - Nectar Cup -- Oral - Nectar Straw -- Oral - Thin Teaspoon -- Oral - Thin Cup -- Oral - Thin Straw -- Oral - Puree -- Oral - Mech Soft -- Oral - Regular -- Oral - Multi-Consistency -- Oral - Pill -- Oral Phase - Comment Pt generally swallowed 2x for each sip/bite  CHL IP PHARYNGEAL PHASE 05/24/2016 Pharyngeal Phase Impaired Pharyngeal- Pudding Teaspoon -- Pharyngeal -- Pharyngeal- Pudding Cup -- Pharyngeal -- Pharyngeal- Honey Teaspoon -- Pharyngeal -- Pharyngeal- Honey Cup -- Pharyngeal -- Pharyngeal- Nectar Teaspoon -- Pharyngeal -- Pharyngeal- Nectar Cup Delayed swallow initiation-vallecula;Pharyngeal residue - valleculae Pharyngeal -- Pharyngeal- Nectar Straw -- Pharyngeal -- Pharyngeal- Thin Teaspoon -- Pharyngeal -- Pharyngeal- Thin Cup Delayed swallow initiation-vallecula;Penetration/Apiration after swallow;Pharyngeal residue - valleculae Pharyngeal Material does not enter airway;Material enters airway, remains ABOVE vocal cords then ejected out Pharyngeal- Thin Straw  -- Pharyngeal -- Pharyngeal- Puree Pharyngeal residue - valleculae Pharyngeal -- Pharyngeal- Mechanical Soft -- Pharyngeal -- Pharyngeal- Regular -- Pharyngeal -- Pharyngeal- Multi-consistency -- Pharyngeal -- Pharyngeal- Pill -- Pharyngeal -- Pharyngeal Comment --  CHL IP CERVICAL ESOPHAGEAL PHASE 05/24/2016 Cervical Esophageal Phase Impaired Pudding Teaspoon -- Pudding Cup -- Honey Teaspoon -- Honey Cup -- Nectar Teaspoon -- Nectar Cup -- Nectar Straw Prominent cricopharyngeal segment;Esophageal backflow into the pharynx Thin Teaspoon -- Thin Cup -- Thin Straw -- Puree Prominent cricopharyngeal segment Mechanical Soft -- Regular -- Multi-consistency -- Pill -- Cervical Esophageal Comment Prominent cricopharyngeus; suspect small Zenker's No flowsheet data found. Thank you, Havery MorosDabney Porter, CCC-SLP [phone] PORTER,DABNEY 05/24/2016, 6:31 PM               Medications:  Prior to Admission:  Prescriptions Prior to Admission  Medication Sig Dispense Refill Last  Dose  . albuterol (PROVENTIL) (2.5 MG/3ML) 0.083% nebulizer solution Take 3 mLs (2.5 mg total) by nebulization every 6 (six) hours as needed for wheezing or shortness of breath. 75 mL 12 05/19/2016 at Unknown time  . amoxicillin-clavulanate (AUGMENTIN) 875-125 MG tablet Take 1 tablet by mouth 2 (two) times daily. 60 tablet 1 05/19/2016 at Unknown time  . aspirin EC 325 MG tablet Take 325 mg by mouth daily.   05/20/2016 at Unknown time  . atorvastatin (LIPITOR) 10 MG tablet Take 1 tablet (10 mg total) by mouth daily. 90 tablet 3 05/19/2016 at Unknown time  . Cholecalciferol (VITAMIN D) 2000 UNITS CAPS Take 2,000-4,000 Units by mouth daily. Takes 2000 units everyday except for Fri, Sat, and Sun patient takes 4000 units.   05/20/2016 at Unknown time  . citalopram (CELEXA) 20 MG tablet Take 1 tablet (20 mg total) by mouth daily. 90 tablet 3 05/20/2016 at Unknown time  . levothyroxine (SYNTHROID, LEVOTHROID) 125 MCG tablet Take 1 tablet (125 mcg total) by mouth  daily.   05/20/2016 at Unknown time  . levothyroxine (SYNTHROID, LEVOTHROID) 25 MCG tablet Take 12.5 mcg by mouth. Take Monday, Wednesday and Friday with the   05/20/2016 at Unknown time  . mycophenolate (CELLCEPT) 500 MG tablet Take 2 tablets by mouth 2 (two) times daily.   05/20/2016 at Unknown time  . omeprazole (PRILOSEC) 20 MG capsule Take 1 capsule (20 mg total) by mouth every evening. 90 capsule 3 05/19/2016 at Unknown time  . predniSONE (DELTASONE) 10 MG tablet Take 15 mg by mouth.    05/19/2016 at Unknown time  . terazosin (HYTRIN) 10 MG capsule Take 1 capsule (10 mg total) by mouth at bedtime. 90 capsule 3 05/19/2016 at Unknown time   Scheduled: . aspirin EC  325 mg Oral Daily  . atorvastatin  10 mg Oral Daily  . azithromycin  500 mg Oral Daily  . cefTRIAXone (ROCEPHIN)  IV  1 g Intravenous Q24H  . citalopram  20 mg Oral Daily  . enoxaparin (LOVENOX) injection  40 mg Subcutaneous Q24H  . guaiFENesin  1,200 mg Oral BID  . insulin aspart  0-20 Units Subcutaneous TID WC  . insulin aspart  0-5 Units Subcutaneous QHS  . insulin glargine  10 Units Subcutaneous Daily  . ipratropium-albuterol  3 mL Nebulization Q4H WA  . levothyroxine  12.5 mcg Oral Once per day on Mon Wed Fri  . levothyroxine  125 mcg Oral QAC breakfast  . methylPREDNISolone (SOLU-MEDROL) injection  60 mg Intravenous Q6H  . mycophenolate  1,000 mg Oral BID  . pantoprazole  40 mg Oral Daily  . terazosin  10 mg Oral QHS   Continuous: . sodium chloride 0.9 % 10 mL/hr (05/24/16 0947)   ZOX:[id number] **OR** acetaminophen, albuterol, ondansetron **OR** ondansetron (ZOFRAN) IV  Assesment: He has acute on chronic hypoxic respiratory failure. At baseline he has pulmonary fibrosis COPD and it looks like he's may have pneumonia as well. He is still very short of breath with minimal exertion Active Problems:   Hypothyroidism   Hyperlipidemia, acquired   PULMONARY FIBROSIS ILD POST INFLAMMATORY CHRONIC   CAP (community  acquired pneumonia)   Pulmonary fibrosis (HCC)   Hyperglycemia, drug-induced   COPD with exacerbation (HCC)   Acute on chronic respiratory failure (HCC)    Plan: No change in treatments    LOS: 5 days   Ciela Mahajan L 05/25/2016, 8:48 AM

## 2016-05-25 NOTE — Progress Notes (Addendum)
Inpatient Diabetes Program Recommendations  AACE/ADA: New Consensus Statement on Inpatient Glycemic Control (2015)  Target Ranges:  Prepandial:   less than 140 mg/dL      Peak postprandial:   less than 180 mg/dL (1-2 hours)      Critically ill patients:  140 - 180 mg/dL   Results for Raymond SkainsGLIDEWELL, Darrly H (MRN [id number]) as of 05/25/2016 07:59  Ref. Range 05/24/2016 07:45 05/24/2016 12:10 05/24/2016 16:06 05/24/2016 20:56 05/25/2016 02:12 05/25/2016 07:38  Glucose-Capillary Latest Ref Range: 65 - 99 mg/dL 409222 (H) 811261 (H) 914137 (H) 87 114 (H) 149 (H)   Review of Glycemic Control  Current orders for Inpatient glycemic control: Lantus 10 units daily, Novolog 0-20 units TID with meals, Novolog 0-5 units QHS  Inpatient Diabetes Program Recommendations: Insulin - Meal Coverage: If steroids are contined and patient is eating at least 50% of meals, please consider ordering Novolog 4 units TID with meals for meal coverage.  Thanks, Orlando PennerMarie Jontrell Bushong, RN, MSN, CDE Diabetes Coordinator Inpatient Diabetes Program [phone] (Team Pager from 8am to 5pm)

## 2016-05-25 NOTE — Progress Notes (Signed)
PROGRESS NOTE    Raymond [name]  EXB:[id number] DOB: Feb 28, 1930 DOA: 05/20/2016 PCP: Rudi Heap, MD    Brief Narrative:  83 year old male with a history of chronic respiratory failure on 4 L of oxygen, COPD, interstitial lung disease/pulmonary fibrosis, came to the hospital with worsening shortness of breath and found to have possible pneumonia, COPD exacerbation. Started on steroids, antibiotics and bronchodilators. Slowly improving. Pulmonology following.   Assessment & Plan:   Active Problems:   Hypothyroidism   Hyperlipidemia, acquired   PULMONARY FIBROSIS ILD POST INFLAMMATORY CHRONIC   CAP (community acquired pneumonia)   Pulmonary fibrosis (HCC)   Hyperglycemia, drug-induced   COPD with exacerbation (HCC)   Acute on chronic respiratory failure (HCC)   1. Acute on chronic respiratory failure with hypoxia. Suspect this is related to pneumonia as well as COPD/pulmonary fibrosis. Currently on baseline oxygen requirement of 4 L. patient likely has a component of dysphagia. Speech therapy following the patient underwent modified barium swallow. He was found to have a small Zenker's diverticulum. Does not appear to be a good candidate for repair. He is continued on a dysphagia 3 diet..  2. Probable community-acquired pneumonia. Treat with ceftriaxone and azithromycin. Influenza panel negative.  3. COPD exacerbation. Continue on IV steroids, antibiotics and bronchodilators. Continue pulmonary hygiene. We'll add Mucomyst nebulizer treatments. He is still short of breath. Will need continued treatment.  4. Interstitial lung disease/pulmonary fibrosis. Continue mycophenolate and steroids. Pulmonology following. His baseline functional status appears to be quite poor.  5. Hyperglycemia. Felt to be related to steroids. Hemoglobin A1c 7.6. On sliding scale insulin and Lantus. Blood sugars have been stable.  6. Hypothyroidism. Continue Synthroid  7. Hyperlipidemia. Continue  statin   DVT prophylaxis: Lovenox Code Status: Partial code, no CPR, no defibrillation Family Communication: Discussed with wife at the bedside Disposition Plan: Discharge home once improved   Consultants:   Pulmonology  Speech therapy  Procedures:     Antimicrobials:   Ceftriaxone 2/22>>  Azithromycin 2/22>>   Subjective: Feels more short of breath this morning. Having difficulty expectorating sputum.  Objective: Vitals:   05/25/16 0909 05/25/16 1116 05/25/16 1412 05/25/16 1503  BP:   133/68   Pulse:   94   Resp:   16   Temp:   97.8 F (36.6 C)   TempSrc:   Oral   SpO2: 93% 92% 96% (S) 97%  Weight:      Height:        Intake/Output Summary (Last 24 hours) at 05/25/16 1610 Last data filed at 05/25/16 0215  Gross per 24 hour  Intake              240 ml  Output              250 ml  Net              -10 ml   Filed Weights   05/20/16 0914 05/20/16 1345 05/21/16 0500  Weight: 74.8 kg (165 lb) 75.2 kg (165 lb 12.6 oz) 76.7 kg (169 lb 1.5 oz)    Examination:  General exam: Appears calm and comfortable  Respiratory system: Diminished breath sounds with bilateral wheezes and rhonchi. Mild increased respiratory effort while speaking. Cardiovascular system: S1 & S2 heard, RRR. No JVD, murmurs, rubs, gallops or clicks. No pedal edema. Gastrointestinal system: Abdomen is nondistended, soft and nontender. No organomegaly or masses felt. Normal bowel sounds heard. Central nervous system: Alert and oriented. No focal neurological deficits. Extremities: Symmetric 5 x  5 power. Skin: No rashes, lesions or ulcers Psychiatry: Judgement and insight appear normal. Mood & affect appropriate.     Data Reviewed: I have personally reviewed following labs and imaging studies  CBC:  Recent Labs Lab 05/20/16 0920 05/20/16 1313 05/21/16 0426 05/23/16 0707  WBC 9.2  --  8.3 12.0*  NEUTROABS 7.5  --   --   --   HGB 11.8* 12.2* 11.4* 11.1*  HCT 36.8* 36.0* 36.2* 35.5*    MCV 95.3  --  95.0 95.9  PLT 184  --  194 208   Basic Metabolic Panel:  Recent Labs Lab 05/20/16 0920 05/20/16 1313 05/21/16 0426 05/23/16 0707  NA 134* 134* 135 138  K 4.2 3.5 4.1 3.9  CL 95* 93* 97* 96*  CO2 32  --  30 34*  GLUCOSE 278* 232* 244* 219*  BUN 14 13 16  27*  CREATININE 0.74 0.60* 0.62 0.69  CALCIUM 8.9  --  8.6* 8.8*   GFR: Estimated Creatinine Clearance: 65.9 mL/min (by C-G formula based on SCr of 0.69 mg/dL). Liver Function Tests:  Recent Labs Lab 05/20/16 0920  AST 23  ALT 22  ALKPHOS 46  BILITOT 0.9  PROT 6.2*  ALBUMIN 3.4*   No results for input(s): LIPASE, AMYLASE in the last 168 hours. No results for input(s): AMMONIA in the last 168 hours. Coagulation Profile: No results for input(s): INR, PROTIME in the last 168 hours. Cardiac Enzymes:  Recent Labs Lab 05/20/16 0920  TROPONINI <0.03   BNP (last 3 results) No results for input(s): PROBNP in the last 8760 hours. HbA1C: No results for input(s): HGBA1C in the last 72 hours. CBG:  Recent Labs Lab 05/24/16 1606 05/24/16 2056 05/25/16 0212 05/25/16 0738 05/25/16 1121  GLUCAP 137* 87 114* 149* 331*   Lipid Profile: No results for input(s): CHOL, HDL, LDLCALC, TRIG, CHOLHDL, LDLDIRECT in the last 72 hours. Thyroid Function Tests: No results for input(s): TSH, T4TOTAL, FREET4, T3FREE, THYROIDAB in the last 72 hours. Anemia Panel: No results for input(s): VITAMINB12, FOLATE, FERRITIN, TIBC, IRON, RETICCTPCT in the last 72 hours. Sepsis Labs:  Recent Labs Lab 05/20/16 0957 05/20/16 1323  LATICACIDVEN 3.10* 2.67*    Recent Results (from the past 240 hour(s))  Blood Culture (routine x 2)     Status: None   Collection Time: 05/20/16 10:10 AM  Result Value Ref Range Status   Specimen Description BLOOD RIGHT FOREARM  Final   Special Requests BOTTLES DRAWN AEROBIC ONLY 6CC  Final   Culture NO GROWTH 5 DAYS  Final   Report Status 05/25/2016 FINAL  Final  Blood Culture (routine x  2)     Status: None   Collection Time: 05/20/16 10:20 AM  Result Value Ref Range Status   Specimen Description BLOOD LEFT ARM  Final   Special Requests BOTTLES DRAWN AEROBIC AND ANAEROBIC 6CC  Final   Culture NO GROWTH 5 DAYS  Final   Report Status 05/25/2016 FINAL  Final  MRSA PCR Screening     Status: None   Collection Time: 05/20/16  1:52 PM  Result Value Ref Range Status   MRSA by PCR NEGATIVE NEGATIVE Final    Comment:        The GeneXpert MRSA Assay (FDA approved for NASAL specimens only), is one component of a comprehensive MRSA colonization surveillance program. It is not intended to diagnose MRSA infection nor to guide or monitor treatment for MRSA infections.          Radiology Studies:  Dg Swallowing Func-speech Pathology  Result Date: 05/24/2016 Objective Swallowing Evaluation: Type of Study: MBS-Modified Barium Swallow Study Patient Details Name: EMMONS TOTH MRN: [id number] Date of Birth: 09-20-1929 Today's Date: 05/24/2016 Time: SLP Start Time (ACUTE ONLY): 1305-SLP Stop Time (ACUTE ONLY): 1335 SLP Time Calculation (min) (ACUTE ONLY): 30 min Past Medical History: Past Medical History: Diagnosis Date . Achilles tendon rupture  . Arthritis  . BPH (benign prostatic hyperplasia)  . CAD (coronary artery disease)  . Cataracts, bilateral  . COPD (chronic obstructive pulmonary disease) (HCC)  . GERD (gastroesophageal reflux disease)  . Hyperlipemia  . Hypothyroid  . ILD (interstitial lung disease) (HCC)  . Prostate hyperplasia, benign localized, with urinary obstruction  Past Surgical History: Past Surgical History: Procedure Laterality Date . ACHILLES TENDON SURGERY  08/20/2011  Procedure: ACHILLES TENDON REPAIR;  Surgeon: Sherri Rad, MD;  Location: [facility];  Service: Orthopedics;  Laterality: Right;  Right primary achilles tendon repair, gastroc slide . APPENDECTOMY   . CHOLECYSTECTOMY   . COLONOSCOPY   . EYE SURGERY    cataracts HPI: 83 year old male  with a history of chronic respiratory failure on 4 L of oxygen, COPD, interstitial lung disease/pulmonary fibrosis, came to the hospital with worsening shortness of breath and found to have possible pneumonia, COPD exacerbation. Started on steroids, antibiotics and bronchodilators. Slowly improving. Pulmonology following. MD ordered BSE due to concerns about aspiration. Subjective: "I have trouble swallowing big pills and dry foods." Assessment / Plan / Recommendation CHL IP CLINICAL IMPRESSIONS 05/24/2016 Clinical Impression Pt seen in lateral and AP view for MBSS and assessed with the following barium tinged textures/consistencies: thin, puree, regular, barium tablet, and nectar-thick liquids. Oropharyngeal swallow essentially WNL for age with swallow trigger at the level of the valleculae across consistencies and textures. Pt with mild lingual residuals after primary swallow, however he independently swallows again to clear any residuals that have pooled in valleculae. Pt with one episode of trace penetration (underepiglottic coating) on second swallow of thin from pooled residuals. Pt was cued to clear throat and it was no longer visualized. Of note, Pt has a prominent cricopharyngeus (CP bar) with what appeared to be an outpouching just above (hypopharyngeal pouch), however no radiologist present to confirm presence of Zenker's diverticulum. Pt was turned AP and it was again visualized. Pharyngeal transit appeared to be more to the left once past the valleculae (mild vallecular pooling noted on the right when assessed with NTL). Pt was presented with NTL to allow for greater contrast and visualization which yielded collection of the tail end of bolus and backflow (only slight) into pyriforms. No overt or gross amount of backflow noted. Recommend D3/mech soft with thin liquids with aspiration/reflux precautions: Pt to sit fully upright for all po and remain up for 30 minutes after, swallow 2x for each sip of  liquid and clear throat/cough intermittently; Pt may wish to avoid dry, crumbly foods. He may have difficulty with some medications. SLP to follow up x1 for pt/family education.  SLP Visit Diagnosis Dysphagia, pharyngoesophageal phase (R13.14) Attention and concentration deficit following -- Frontal lobe and executive function deficit following -- Impact on safety and function Mild aspiration risk;Moderate aspiration risk   CHL IP TREATMENT RECOMMENDATION 05/24/2016 Treatment Recommendations Therapy as outlined in treatment plan below   Prognosis 05/24/2016 Prognosis for Safe Diet Advancement Fair Barriers to Reach Goals Severity of deficits Barriers/Prognosis Comment CP bar CHL IP DIET RECOMMENDATION 05/24/2016 SLP Diet Recommendations Dysphagia 3 (Mech soft) solids;Thin liquid Liquid  Administration via Cup Medication Administration Whole meds with liquid Compensations Multiple dry swallows after each bite/sip Postural Changes Remain semi-upright after after feeds/meals (Comment);Seated upright at 90 degrees   CHL IP OTHER RECOMMENDATIONS 05/24/2016 Recommended Consults Consider esophageal assessment Oral Care Recommendations Oral care BID Other Recommendations Clarify dietary restrictions   CHL IP FOLLOW UP RECOMMENDATIONS 05/24/2016 Follow up Recommendations None   CHL IP FREQUENCY AND DURATION 05/24/2016 Speech Therapy Frequency (ACUTE ONLY) min 1 x/week Treatment Duration 1 week      CHL IP ORAL PHASE 05/24/2016 Oral Phase Impaired Oral - Pudding Teaspoon -- Oral - Pudding Cup -- Oral - Honey Teaspoon -- Oral - Honey Cup -- Oral - Nectar Teaspoon -- Oral - Nectar Cup -- Oral - Nectar Straw -- Oral - Thin Teaspoon -- Oral - Thin Cup -- Oral - Thin Straw -- Oral - Puree -- Oral - Mech Soft -- Oral - Regular -- Oral - Multi-Consistency -- Oral - Pill -- Oral Phase - Comment Pt generally swallowed 2x for each sip/bite  CHL IP PHARYNGEAL PHASE 05/24/2016 Pharyngeal Phase Impaired Pharyngeal- Pudding Teaspoon -- Pharyngeal  -- Pharyngeal- Pudding Cup -- Pharyngeal -- Pharyngeal- Honey Teaspoon -- Pharyngeal -- Pharyngeal- Honey Cup -- Pharyngeal -- Pharyngeal- Nectar Teaspoon -- Pharyngeal -- Pharyngeal- Nectar Cup Delayed swallow initiation-vallecula;Pharyngeal residue - valleculae Pharyngeal -- Pharyngeal- Nectar Straw -- Pharyngeal -- Pharyngeal- Thin Teaspoon -- Pharyngeal -- Pharyngeal- Thin Cup Delayed swallow initiation-vallecula;Penetration/Apiration after swallow;Pharyngeal residue - valleculae Pharyngeal Material does not enter airway;Material enters airway, remains ABOVE vocal cords then ejected out Pharyngeal- Thin Straw -- Pharyngeal -- Pharyngeal- Puree Pharyngeal residue - valleculae Pharyngeal -- Pharyngeal- Mechanical Soft -- Pharyngeal -- Pharyngeal- Regular -- Pharyngeal -- Pharyngeal- Multi-consistency -- Pharyngeal -- Pharyngeal- Pill -- Pharyngeal -- Pharyngeal Comment --  CHL IP CERVICAL ESOPHAGEAL PHASE 05/24/2016 Cervical Esophageal Phase Impaired Pudding Teaspoon -- Pudding Cup -- Honey Teaspoon -- Honey Cup -- Nectar Teaspoon -- Nectar Cup -- Nectar Straw Prominent cricopharyngeal segment;Esophageal backflow into the pharynx Thin Teaspoon -- Thin Cup -- Thin Straw -- Puree Prominent cricopharyngeal segment Mechanical Soft -- Regular -- Multi-consistency -- Pill -- Cervical Esophageal Comment Prominent cricopharyngeus; suspect small Zenker's No flowsheet data found. Thank you, Havery Moros, CCC-SLP [phone] PORTER,DABNEY 05/24/2016, 6:31 PM                   Scheduled Meds: . aspirin EC  325 mg Oral Daily  . atorvastatin  10 mg Oral Daily  . azithromycin  500 mg Oral Daily  . cefTRIAXone (ROCEPHIN)  IV  1 g Intravenous Q24H  . citalopram  20 mg Oral Daily  . enoxaparin (LOVENOX) injection  40 mg Subcutaneous Q24H  . guaiFENesin  1,200 mg Oral BID  . insulin aspart  0-20 Units Subcutaneous TID WC  . insulin aspart  0-5 Units Subcutaneous QHS  . insulin glargine  10 Units Subcutaneous Daily    . ipratropium-albuterol  3 mL Nebulization Q4H WA  . levothyroxine  12.5 mcg Oral Once per day on Mon Wed Fri  . levothyroxine  125 mcg Oral QAC breakfast  . methylPREDNISolone (SOLU-MEDROL) injection  60 mg Intravenous Q6H  . mycophenolate  1,000 mg Oral BID  . pantoprazole  40 mg Oral Daily  . terazosin  10 mg Oral QHS   Continuous Infusions: . sodium chloride 0.9 % 10 mL/hr (05/24/16 0947)     LOS: 5 days    Time spent:    MEMON,JEHANZEB, MD [facility] Pager [phone]  If 7PM-7AM, please contact night-coverage [email or web address] Password [email or web address] 05/25/2016, 4:10 PM

## 2016-05-25 NOTE — Progress Notes (Signed)
Speech Language Pathology Dysphagia Treatment Patient Details Name: Raymond SkainsWilliam H [name] MRN: [id number] DOB: May 29, 1929 Today's Date: 05/25/2016 Time: 1012-1030 SLP Time Calculation (min) (ACUTE ONLY): 18 min  Assessment / Plan / Recommendation Clinical Impression   Pt seen for dysphagia treatment following BSE and MBSS yesterday. Pt seated upright in bed swallowing pills with thin liquid using a straw when clinician arrived. Pt wife present throughout visit. Education provided for compensatory strategies including small bites, multiple swallows, intermittent throat clear, and upright seated positioning with understanding expressed. Recommend Dysphagia 3 diet and thin liquids. Education provided, no follow up recommended.     Diet Recommendation    Recommend Dysphagia 3 diet (mech soft) and thin liquids via cup sips.   SLP Plan   Education completed, SLP will sign off.  Pertinent Vitals/Pain    Swallowing Goals     General HPI: 83 year old male with a history of chronic respiratory failure on 4 L of oxygen, COPD, interstitial lung disease/pulmonary fibrosis, came to the hospital with worsening shortness of breath and found to have possible pneumonia, COPD exacerbation. Started on steroids, antibiotics and bronchodilators. Slowly improving. Pulmonology following. MD ordered BSE due to concerns about aspiration.  Oral Cavity - Oral Hygiene     Dysphagia Treatment Family/Caregiver Educated: Wife Treatment Methods: Patient/caregiver education;Compensation strategy training Patient observed directly with PO's: Yes Type of PO's observed: Thin liquids Feeding: Able to feed self Liquids provided via: Straw Amount of cueing: Independent   GO    Occidental PetroleumBess Laronn Devonshire SLP Graduate Clinician   [facility] Center IncBess Genesis Paget 05/25/2016, 10:50 AM

## 2016-05-26 DIAGNOSIS — I2510 Atherosclerotic heart disease of native coronary artery without angina pectoris: Secondary | ICD-10-CM

## 2016-05-26 DIAGNOSIS — N400 Enlarged prostate without lower urinary tract symptoms: Secondary | ICD-10-CM

## 2016-05-26 LAB — BASIC METABOLIC PANEL
Anion gap: 7 (ref 5–15)
BUN: 25 mg/dL — AB (ref 6–20)
CALCIUM: 8.8 mg/dL — AB (ref 8.9–10.3)
CHLORIDE: 92 mmol/L — AB (ref 101–111)
CO2: 36 mmol/L — ABNORMAL HIGH (ref 22–32)
CREATININE: 0.75 mg/dL (ref 0.61–1.24)
Glucose, Bld: 204 mg/dL — ABNORMAL HIGH (ref 65–99)
Potassium: 4 mmol/L (ref 3.5–5.1)
SODIUM: 135 mmol/L (ref 135–145)

## 2016-05-26 LAB — CBC
HCT: 38.6 % — ABNORMAL LOW (ref 39.0–52.0)
HEMOGLOBIN: 12.2 g/dL — AB (ref 13.0–17.0)
MCH: 30 pg (ref 26.0–34.0)
MCHC: 31.6 g/dL (ref 30.0–36.0)
MCV: 95.1 fL (ref 78.0–100.0)
PLATELETS: 215 10*3/uL (ref 150–400)
RBC: 4.06 MIL/uL — AB (ref 4.22–5.81)
RDW: 13.2 % (ref 11.5–15.5)
WBC: 11 10*3/uL — ABNORMAL HIGH (ref 4.0–10.5)

## 2016-05-26 LAB — GLUCOSE, CAPILLARY
GLUCOSE-CAPILLARY: 207 mg/dL — AB (ref 65–99)
Glucose-Capillary: 214 mg/dL — ABNORMAL HIGH (ref 65–99)
Glucose-Capillary: 311 mg/dL — ABNORMAL HIGH (ref 65–99)
Glucose-Capillary: 182 mg/dL — ABNORMAL HIGH (ref 65–99)

## 2016-05-26 MED ORDER — SODIUM CHLORIDE 3 % IN NEBU
4.0000 mL | INHALATION_SOLUTION | Freq: Every day | RESPIRATORY_TRACT | Status: DC
  Filled 2016-05-26 (×3): qty 4

## 2016-05-26 MED ORDER — SALINE SPRAY 0.65 % NA SOLN
1.0000 | NASAL | Status: DC | PRN
  Administered 2016-05-26: 1 via NASAL
  Filled 2016-05-26: qty 44

## 2016-05-26 MED ORDER — SODIUM CHLORIDE 0.9 % IN NEBU
3.0000 mL | INHALATION_SOLUTION | Freq: Every day | RESPIRATORY_TRACT | Status: DC
  Administered 2016-05-26 – 2016-06-03 (×8): 3 mL via RESPIRATORY_TRACT
  Filled 2016-05-26 (×5): qty 3

## 2016-05-26 NOTE — Progress Notes (Signed)
PROGRESS NOTE    Raymond [name]  ZOX:[id number] DOB: 1929/10/03 DOA: 05/20/2016 PCP: Rudi Heap, MD   Brief Narrative:  83 year old male with a history of chronic respiratory failure on 4 L of oxygen, COPD, interstitial lung disease/pulmonary fibrosis, came to the hospital with worsening shortness of breath and found to have possible pneumonia, COPD exacerbation. Started on IV steroids, antibiotics and bronchodilators. Slowly improving and not able to expectorate much sputum. Pulmonology following and appreciate recc's.  Assessment & Plan:   Active Problems:   Hypothyroidism   Hyperlipidemia, acquired   PULMONARY FIBROSIS ILD POST INFLAMMATORY CHRONIC   CAP (community acquired pneumonia)   Pulmonary fibrosis (HCC)   Hyperglycemia, drug-induced   COPD with exacerbation (HCC)   Acute on chronic respiratory failure (HCC)  1. Acute on chronic respiratory failure with hypoxia.  -Suspect this is related to pneumonia as well as COPD/pulmonary fibrosis.  -Currently on slightly higher than baseline oxygen requirement of 4 L.  -Patient likely has a component of dysphagia. Speech therapy following the patient underwent modified barium swallow. He was found to have a small Zenker's diverticulum. Does not appear to be a good candidate for repair. He is continued on a dysphagia 3 diet.. -Added Sodium Chloride 0.9% Neb Solution 3 mL Daily as did not have Hypertonic Saline Nebs per Pharmacy.   2. Probable community-acquired pneumonia.  -C/w Treatment with Ceftriaxone and Azithromycin.  -Influenza panel negative.  3. COPD exacerbation.  -Continue on IV Methylprednisolone 60 mg IV q6h, Abx with po Azithromycin 500 mg po Daily and Ceftriaxone 1 gram q24h and Bronchodilators; C/w IH DuoNebs, Albuterol, and Mucomyst.  -Continue pulmonary hygiene.  -Added 0.9% Nebulizer Solution Daily  4. Interstitial lung disease/pulmonary fibrosis.  -Continue Mycophenolate 1000 mg po BID and IV  Methylprednisolone 60 mg IV q6h. -Pulmonology Dr. Juanetta Gosling following and appreciate Recc's.  -His baseline functional status appears to be quite poor.  5. Hyperglycemia.  -Felt to be related to steroids.  -Hemoglobin A1c 7.6.  -On Resistant Novolog Sliding Scale Insulin and Lantus 10 units sq daily.  -CBG's have ranged from 155-311.  6. Hypothyroidism. -Continue Synthroid 12.5 mcg po MWF and Levothyroxine 125 mcg po Daily   7. Hyperlipidemia.  -Continue Atorvastatin 10 mg po Daily  8. BPH -C/w Terazosin 10 mg po qHS  9. CAD -C/w ASA 325 mg po Daily and Atorvastatin 10 mg po Daily  DVT prophylaxis: Lovenox 40 mg sq q24h Code Status: Partial Code; No CPR, No Defibrillation  Family Communication: Discussed with Wife at Bedside Disposition Plan: Home Health PT when stable with Rollator Walker  Consultants:   Pulmonology   Procedures:  None   Antimicrobials:  Anti-infectives    Start     Dose/Rate Route Frequency Ordered Stop   05/24/16 1000  azithromycin (ZITHROMAX) tablet 500 mg     500 mg Oral Daily 05/23/16 1113     05/21/16 1030  azithromycin (ZITHROMAX) 500 mg in dextrose 5 % 250 mL IVPB  Status:  Discontinued     500 mg 250 mL/hr over 60 Minutes Intravenous Every 24 hours 05/20/16 1410 05/23/16 1113   05/21/16 1000  cefTRIAXone (ROCEPHIN) 1 g in dextrose 5 % 50 mL IVPB     1 g 100 mL/hr over 30 Minutes Intravenous Every 24 hours 05/20/16 1410     05/20/16 1000  cefTRIAXone (ROCEPHIN) 1 g in dextrose 5 % 50 mL IVPB     1 g 100 mL/hr over 30 Minutes Intravenous  Once 05/20/16 0945 05/20/16  1053   05/20/16 1000  azithromycin (ZITHROMAX) 500 mg in dextrose 5 % 250 mL IVPB     500 mg 250 mL/hr over 60 Minutes Intravenous  Once 05/20/16 0945 05/20/16 1201     Subjective: Seen and examined at bedside this AM and states he wasn't doing as well respiratory wise and had difficulty breathing as well as expectorating sputum. States that "it isn't coming up." No Nausea  or vomiting. No other concerns or complaints at this time.   Objective: Vitals:   05/25/16 2101 05/25/16 2300 05/26/16 0445 05/26/16 0458  BP: (!) 147/66  (!) 172/97   Pulse: 90  95   Resp: 16  16   Temp: 98 F (36.7 C)  98.5 F (36.9 C)   TempSrc: Oral  Oral   SpO2: 94% 91% 92% 91%  Weight:      Height:        Intake/Output Summary (Last 24 hours) at 05/26/16 0756 Last data filed at 05/26/16 0446  Gross per 24 hour  Intake              720 ml  Output              300 ml  Net              420 ml   Filed Weights   05/20/16 0914 05/20/16 1345 05/21/16 0500  Weight: 74.8 kg (165 lb) 75.2 kg (165 lb 12.6 oz) 76.7 kg (169 lb 1.5 oz)   Examination: Physical Exam:  Constitutional: NAD and appears calm and comfortable but slightly dyspneic  Eyes:  Lids and conjunctivae normal, sclerae anicteric  ENMT: External Ears, Nose appear normal. Grossly normal hearing. Neck: Appears normal, supple, no cervical masses, normal ROM, no appreciable thyromegaly Respiratory: Diminished to auscultation bilaterally with expiratory wheezing and rhonic. Increased respiratory effort as patient is slightly tachypenic. No accessory muscle use but wearing O2 via NVC.  Cardiovascular: RRR, no murmurs / rubs / gallops. S1 and S2 auscultated. No extremity edema.  Abdomen: Soft, non-tender, non-distended. No masses palpated. No appreciable hepatosplenomegaly. Bowel sounds positive x4.  GU: Deferred. Musculoskeletal: No clubbing / cyanosis of digits/nails. No joint deformity upper and lower extremities. Normal strength and muscle tone.  Skin: No rashes, lesions, ulcers on limited skin evaluation. No induration; Warm and dry.  Neurologic: CN 2-12 grossly intact with no focal deficits. Romberg sign cerebellar reflexes not assessed.  Psychiatric: Normal judgment and insight. Alert and oriented x 3. Normal mood and appropriate affect.   Data Reviewed: I have personally reviewed following labs and imaging  studies  CBC:  Recent Labs Lab 05/20/16 0920 05/20/16 1313 05/21/16 0426 05/23/16 0707 05/26/16 0444  WBC 9.2  --  8.3 12.0* 11.0*  NEUTROABS 7.5  --   --   --   --   HGB 11.8* 12.2* 11.4* 11.1* 12.2*  HCT 36.8* 36.0* 36.2* 35.5* 38.6*  MCV 95.3  --  95.0 95.9 95.1  PLT 184  --  194 208 215   Basic Metabolic Panel:  Recent Labs Lab 05/20/16 0920 05/20/16 1313 05/21/16 0426 05/23/16 0707 05/26/16 0444  NA 134* 134* 135 138 135  K 4.2 3.5 4.1 3.9 4.0  CL 95* 93* 97* 96* 92*  CO2 32  --  30 34* 36*  GLUCOSE 278* 232* 244* 219* 204*  BUN 14 13 16  27* 25*  CREATININE 0.74 0.60* 0.62 0.69 0.75  CALCIUM 8.9  --  8.6* 8.8* 8.8*   GFR: Estimated  Creatinine Clearance: 65.9 mL/min (by C-G formula based on SCr of 0.75 mg/dL). Liver Function Tests:  Recent Labs Lab 05/20/16 0920  AST 23  ALT 22  ALKPHOS 46  BILITOT 0.9  PROT 6.2*  ALBUMIN 3.4*   No results for input(s): LIPASE, AMYLASE in the last 168 hours. No results for input(s): AMMONIA in the last 168 hours. Coagulation Profile: No results for input(s): INR, PROTIME in the last 168 hours. Cardiac Enzymes:  Recent Labs Lab 05/20/16 0920  TROPONINI <0.03   BNP (last 3 results) No results for input(s): PROBNP in the last 8760 hours. HbA1C: No results for input(s): HGBA1C in the last 72 hours. CBG:  Recent Labs Lab 05/25/16 0738 05/25/16 1121 05/25/16 1617 05/25/16 2104 05/26/16 0717  GLUCAP 149* 331* 198* 155* 214*   Lipid Profile: No results for input(s): CHOL, HDL, LDLCALC, TRIG, CHOLHDL, LDLDIRECT in the last 72 hours. Thyroid Function Tests: No results for input(s): TSH, T4TOTAL, FREET4, T3FREE, THYROIDAB in the last 72 hours. Anemia Panel: No results for input(s): VITAMINB12, FOLATE, FERRITIN, TIBC, IRON, RETICCTPCT in the last 72 hours. Sepsis Labs:  Recent Labs Lab 05/20/16 0957 05/20/16 1323  LATICACIDVEN 3.10* 2.67*    Recent Results (from the past 240 hour(s))  Blood Culture  (routine x 2)     Status: None   Collection Time: 05/20/16 10:10 AM  Result Value Ref Range Status   Specimen Description BLOOD RIGHT FOREARM  Final   Special Requests BOTTLES DRAWN AEROBIC ONLY 6CC  Final   Culture NO GROWTH 5 DAYS  Final   Report Status 05/25/2016 FINAL  Final  Blood Culture (routine x 2)     Status: None   Collection Time: 05/20/16 10:20 AM  Result Value Ref Range Status   Specimen Description BLOOD LEFT ARM  Final   Special Requests BOTTLES DRAWN AEROBIC AND ANAEROBIC 6CC  Final   Culture NO GROWTH 5 DAYS  Final   Report Status 05/25/2016 FINAL  Final  MRSA PCR Screening     Status: None   Collection Time: 05/20/16  1:52 PM  Result Value Ref Range Status   MRSA by PCR NEGATIVE NEGATIVE Final    Comment:        The GeneXpert MRSA Assay (FDA approved for NASAL specimens only), is one component of a comprehensive MRSA colonization surveillance program. It is not intended to diagnose MRSA infection nor to guide or monitor treatment for MRSA infections.     Radiology Studies: Dg Swallowing Func-speech Pathology  Result Date: 05/24/2016 Objective Swallowing Evaluation: Type of Study: MBS-Modified Barium Swallow Study Patient Details Name: Terri SkainsWilliam H Sofia MRN: [id number] Date of Birth: 1929/11/11 Today's Date: 05/24/2016 Time: SLP Start Time (ACUTE ONLY): 1305-SLP Stop Time (ACUTE ONLY): 1335 SLP Time Calculation (min) (ACUTE ONLY): 30 min Past Medical History: Past Medical History: Diagnosis Date . Achilles tendon rupture  . Arthritis  . BPH (benign prostatic hyperplasia)  . CAD (coronary artery disease)  . Cataracts, bilateral  . COPD (chronic obstructive pulmonary disease) (HCC)  . GERD (gastroesophageal reflux disease)  . Hyperlipemia  . Hypothyroid  . ILD (interstitial lung disease) (HCC)  . Prostate hyperplasia, benign localized, with urinary obstruction  Past Surgical History: Past Surgical History: Procedure Laterality Date . ACHILLES TENDON SURGERY  08/20/2011   Procedure: ACHILLES TENDON REPAIR;  Surgeon: Sherri RadPaul A Bednarz, MD;  Location: [facility];  Service: Orthopedics;  Laterality: Right;  Right primary achilles tendon repair, gastroc slide . APPENDECTOMY   . CHOLECYSTECTOMY   .  COLONOSCOPY   . EYE SURGERY    cataracts HPI: 83 year old male with a history of chronic respiratory failure on 4 L of oxygen, COPD, interstitial lung disease/pulmonary fibrosis, came to the hospital with worsening shortness of breath and found to have possible pneumonia, COPD exacerbation. Started on steroids, antibiotics and bronchodilators. Slowly improving. Pulmonology following. MD ordered BSE due to concerns about aspiration. Subjective: "I have trouble swallowing big pills and dry foods." Assessment / Plan / Recommendation CHL IP CLINICAL IMPRESSIONS 05/24/2016 Clinical Impression Pt seen in lateral and AP view for MBSS and assessed with the following barium tinged textures/consistencies: thin, puree, regular, barium tablet, and nectar-thick liquids. Oropharyngeal swallow essentially WNL for age with swallow trigger at the level of the valleculae across consistencies and textures. Pt with mild lingual residuals after primary swallow, however he independently swallows again to clear any residuals that have pooled in valleculae. Pt with one episode of trace penetration (underepiglottic coating) on second swallow of thin from pooled residuals. Pt was cued to clear throat and it was no longer visualized. Of note, Pt has a prominent cricopharyngeus (CP bar) with what appeared to be an outpouching just above (hypopharyngeal pouch), however no radiologist present to confirm presence of Zenker's diverticulum. Pt was turned AP and it was again visualized. Pharyngeal transit appeared to be more to the left once past the valleculae (mild vallecular pooling noted on the right when assessed with NTL). Pt was presented with NTL to allow for greater contrast and visualization which yielded  collection of the tail end of bolus and backflow (only slight) into pyriforms. No overt or gross amount of backflow noted. Recommend D3/mech soft with thin liquids with aspiration/reflux precautions: Pt to sit fully upright for all po and remain up for 30 minutes after, swallow 2x for each sip of liquid and clear throat/cough intermittently; Pt may wish to avoid dry, crumbly foods. He may have difficulty with some medications. SLP to follow up x1 for pt/family education.  SLP Visit Diagnosis Dysphagia, pharyngoesophageal phase (R13.14) Attention and concentration deficit following -- Frontal lobe and executive function deficit following -- Impact on safety and function Mild aspiration risk;Moderate aspiration risk   CHL IP TREATMENT RECOMMENDATION 05/24/2016 Treatment Recommendations Therapy as outlined in treatment plan below   Prognosis 05/24/2016 Prognosis for Safe Diet Advancement Fair Barriers to Reach Goals Severity of deficits Barriers/Prognosis Comment CP bar CHL IP DIET RECOMMENDATION 05/24/2016 SLP Diet Recommendations Dysphagia 3 (Mech soft) solids;Thin liquid Liquid Administration via Cup Medication Administration Whole meds with liquid Compensations Multiple dry swallows after each bite/sip Postural Changes Remain semi-upright after after feeds/meals (Comment);Seated upright at 90 degrees   CHL IP OTHER RECOMMENDATIONS 05/24/2016 Recommended Consults Consider esophageal assessment Oral Care Recommendations Oral care BID Other Recommendations Clarify dietary restrictions   CHL IP FOLLOW UP RECOMMENDATIONS 05/24/2016 Follow up Recommendations None   CHL IP FREQUENCY AND DURATION 05/24/2016 Speech Therapy Frequency (ACUTE ONLY) min 1 x/week Treatment Duration 1 week      CHL IP ORAL PHASE 05/24/2016 Oral Phase Impaired Oral - Pudding Teaspoon -- Oral - Pudding Cup -- Oral - Honey Teaspoon -- Oral - Honey Cup -- Oral - Nectar Teaspoon -- Oral - Nectar Cup -- Oral - Nectar Straw -- Oral - Thin Teaspoon -- Oral -  Thin Cup -- Oral - Thin Straw -- Oral - Puree -- Oral - Mech Soft -- Oral - Regular -- Oral - Multi-Consistency -- Oral - Pill -- Oral Phase - Comment Pt generally swallowed 2x for  each sip/bite  CHL IP PHARYNGEAL PHASE 05/24/2016 Pharyngeal Phase Impaired Pharyngeal- Pudding Teaspoon -- Pharyngeal -- Pharyngeal- Pudding Cup -- Pharyngeal -- Pharyngeal- Honey Teaspoon -- Pharyngeal -- Pharyngeal- Honey Cup -- Pharyngeal -- Pharyngeal- Nectar Teaspoon -- Pharyngeal -- Pharyngeal- Nectar Cup Delayed swallow initiation-vallecula;Pharyngeal residue - valleculae Pharyngeal -- Pharyngeal- Nectar Straw -- Pharyngeal -- Pharyngeal- Thin Teaspoon -- Pharyngeal -- Pharyngeal- Thin Cup Delayed swallow initiation-vallecula;Penetration/Apiration after swallow;Pharyngeal residue - valleculae Pharyngeal Material does not enter airway;Material enters airway, remains ABOVE vocal cords then ejected out Pharyngeal- Thin Straw -- Pharyngeal -- Pharyngeal- Puree Pharyngeal residue - valleculae Pharyngeal -- Pharyngeal- Mechanical Soft -- Pharyngeal -- Pharyngeal- Regular -- Pharyngeal -- Pharyngeal- Multi-consistency -- Pharyngeal -- Pharyngeal- Pill -- Pharyngeal -- Pharyngeal Comment --  CHL IP CERVICAL ESOPHAGEAL PHASE 05/24/2016 Cervical Esophageal Phase Impaired Pudding Teaspoon -- Pudding Cup -- Honey Teaspoon -- Honey Cup -- Nectar Teaspoon -- Nectar Cup -- Nectar Straw Prominent cricopharyngeal segment;Esophageal backflow into the pharynx Thin Teaspoon -- Thin Cup -- Thin Straw -- Puree Prominent cricopharyngeal segment Mechanical Soft -- Regular -- Multi-consistency -- Pill -- Cervical Esophageal Comment Prominent cricopharyngeus; suspect small Zenker's No flowsheet data found. Thank you, Havery Moros, CCC-SLP [phone] PORTER,DABNEY 05/24/2016, 6:31 PM              Scheduled Meds: . acetylcysteine  3 mL Nebulization TID  . aspirin EC  325 mg Oral Daily  . atorvastatin  10 mg Oral Daily  . azithromycin  500 mg Oral Daily   . cefTRIAXone (ROCEPHIN)  IV  1 g Intravenous Q24H  . citalopram  20 mg Oral Daily  . enoxaparin (LOVENOX) injection  40 mg Subcutaneous Q24H  . guaiFENesin  1,200 mg Oral BID  . insulin aspart  0-20 Units Subcutaneous TID WC  . insulin aspart  0-5 Units Subcutaneous QHS  . insulin glargine  10 Units Subcutaneous Daily  . ipratropium-albuterol  3 mL Nebulization Q4H WA  . levothyroxine  12.5 mcg Oral Once per day on Mon Wed Fri  . levothyroxine  125 mcg Oral QAC breakfast  . methylPREDNISolone (SOLU-MEDROL) injection  60 mg Intravenous Q6H  . mycophenolate  1,000 mg Oral BID  . pantoprazole  40 mg Oral Daily  . terazosin  10 mg Oral QHS   Continuous Infusions: . sodium chloride 0.9 % 10 mL/hr (05/24/16 0947)    LOS: 6 days   Merlene Laughter, DO [facility] Pager [phone]  If 7PM-7AM, please contact night-coverage [email or web address] Password [facility] 05/26/2016, 7:56 AM

## 2016-05-26 NOTE — Progress Notes (Signed)
Subjective: He still having a lot of trouble. He is taking Mucomyst now but still not bringing up much sputum. He has no other new complaints. He is still short of breath with minimal exertion such as trying to use the urinal. No chest pain. No fever or chills. No abdominal pain nausea or vomiting  Objective: Vital signs in last 24 hours: Temp:  [97.8 F (36.6 C)-98.5 F (36.9 C)] 98.5 F (36.9 C) (02/28 0445) Pulse Rate:  [90-95] 95 (02/28 0445) Resp:  [16] 16 (02/28 0445) BP: (133-172)/(66-97) 172/97 (02/28 0445) SpO2:  [88 %-97 %] 88 % (02/28 0808) Weight change:  Last BM Date: 05/23/16  Intake/Output from previous day: 02/27 0701 - 02/28 0700 In: 720 [P.O.:720] Out: 300 [Urine:300]  PHYSICAL EXAM General appearance: alert, cooperative and moderate distress Resp: He has less rhonchi than yesterday. He has bilateral rales which are chronic Cardio: regular rate and rhythm, S1, S2 normal, no murmur, click, rub or gallop GI: soft, non-tender; bowel sounds normal; no masses,  no organomegaly Extremities: extremities normal, atraumatic, no cyanosis or edema Skin warm and dry with multiple bruises from chronic prednisone use. Mucous membranes are moist  Lab Results:  Results for orders placed or performed during the hospital encounter of 05/20/16 (from the past 48 hour(s))  Glucose, capillary     Status: Abnormal   Collection Time: 05/24/16 12:10 PM  Result Value Ref Range   Glucose-Capillary 261 (H) 65 - 99 mg/dL   Comment 1 Notify RN    Comment 2 Document in Chart   Glucose, capillary     Status: Abnormal   Collection Time: 05/24/16  4:06 PM  Result Value Ref Range   Glucose-Capillary 137 (H) 65 - 99 mg/dL  Glucose, capillary     Status: None   Collection Time: 05/24/16  8:56 PM  Result Value Ref Range   Glucose-Capillary 87 65 - 99 mg/dL  Glucose, capillary     Status: Abnormal   Collection Time: 05/25/16  2:12 AM  Result Value Ref Range   Glucose-Capillary 114 (H) 65  - 99 mg/dL   Comment 1 Notify RN    Comment 2 Document in Chart   Glucose, capillary     Status: Abnormal   Collection Time: 05/25/16  7:38 AM  Result Value Ref Range   Glucose-Capillary 149 (H) 65 - 99 mg/dL  Glucose, capillary     Status: Abnormal   Collection Time: 05/25/16 11:21 AM  Result Value Ref Range   Glucose-Capillary 331 (H) 65 - 99 mg/dL  Glucose, capillary     Status: Abnormal   Collection Time: 05/25/16  4:17 PM  Result Value Ref Range   Glucose-Capillary 198 (H) 65 - 99 mg/dL  Glucose, capillary     Status: Abnormal   Collection Time: 05/25/16  9:04 PM  Result Value Ref Range   Glucose-Capillary 155 (H) 65 - 99 mg/dL   Comment 1 Notify RN    Comment 2 Document in Chart   CBC     Status: Abnormal   Collection Time: 05/26/16  4:44 AM  Result Value Ref Range   WBC 11.0 (H) 4.0 - 10.5 K/uL   RBC 4.06 (L) 4.22 - 5.81 MIL/uL   Hemoglobin 12.2 (L) 13.0 - 17.0 g/dL   HCT 38.6 (L) 39.0 - 52.0 %   MCV 95.1 78.0 - 100.0 fL   MCH 30.0 26.0 - 34.0 pg   MCHC 31.6 30.0 - 36.0 g/dL   RDW 13.2 11.5 - 15.5 %  Platelets 215 150 - 400 K/uL  Basic metabolic panel     Status: Abnormal   Collection Time: 05/26/16  4:44 AM  Result Value Ref Range   Sodium 135 135 - 145 mmol/L   Potassium 4.0 3.5 - 5.1 mmol/L   Chloride 92 (L) 101 - 111 mmol/L   CO2 36 (H) 22 - 32 mmol/L   Glucose, Bld 204 (H) 65 - 99 mg/dL   BUN 25 (H) 6 - 20 mg/dL   Creatinine, Ser 0.75 0.61 - 1.24 mg/dL   Calcium 8.8 (L) 8.9 - 10.3 mg/dL   GFR calc non Af Amer >60 >60 mL/min   GFR calc Af Amer >60 >60 mL/min    Comment: (NOTE) The eGFR has been calculated using the CKD EPI equation. This calculation has not been validated in all clinical situations. eGFR's persistently <60 mL/min signify possible Chronic Kidney Disease.    Anion gap 7 5 - 15  Glucose, capillary     Status: Abnormal   Collection Time: 05/26/16  7:17 AM  Result Value Ref Range   Glucose-Capillary 214 (H) 65 - 99 mg/dL   Comment 1  Notify RN    Comment 2 Document in Chart     ABGS No results for input(s): PHART, PO2ART, TCO2, HCO3 in the last 72 hours.  Invalid input(s): PCO2 CULTURES Recent Results (from the past 240 hour(s))  Blood Culture (routine x 2)     Status: None   Collection Time: 05/20/16 10:10 AM  Result Value Ref Range Status   Specimen Description BLOOD RIGHT FOREARM  Final   Special Requests BOTTLES DRAWN AEROBIC ONLY 6CC  Final   Culture NO GROWTH 5 DAYS  Final   Report Status 05/25/2016 FINAL  Final  Blood Culture (routine x 2)     Status: None   Collection Time: 05/20/16 10:20 AM  Result Value Ref Range Status   Specimen Description BLOOD LEFT ARM  Final   Special Requests BOTTLES DRAWN AEROBIC AND ANAEROBIC 6CC  Final   Culture NO GROWTH 5 DAYS  Final   Report Status 05/25/2016 FINAL  Final  MRSA PCR Screening     Status: None   Collection Time: 05/20/16  1:52 PM  Result Value Ref Range Status   MRSA by PCR NEGATIVE NEGATIVE Final    Comment:        The GeneXpert MRSA Assay (FDA approved for NASAL specimens only), is one component of a comprehensive MRSA colonization surveillance program. It is not intended to diagnose MRSA infection nor to guide or monitor treatment for MRSA infections.    Studies/Results: Dg Swallowing Func-speech Pathology  Result Date: 05/24/2016 Objective Swallowing Evaluation: Type of Study: MBS-Modified Barium Swallow Study Patient Details Name: ELBA DENDINGER MRN: [id number] Date of Birth: 08/08/1929 Today's Date: 05/24/2016 Time: SLP Start Time (ACUTE ONLY): 1305-SLP Stop Time (ACUTE ONLY): 1335 SLP Time Calculation (min) (ACUTE ONLY): 30 min Past Medical History: Past Medical History: Diagnosis Date . Achilles tendon rupture  . Arthritis  . BPH (benign prostatic hyperplasia)  . CAD (coronary artery disease)  . Cataracts, bilateral  . COPD (chronic obstructive pulmonary disease) ([place])  . GERD (gastroesophageal reflux disease)  . Hyperlipemia  .  Hypothyroid  . ILD (interstitial lung disease) ([place])  . Prostate hyperplasia, benign localized, with urinary obstruction  Past Surgical History: Past Surgical History: Procedure Laterality Date . ACHILLES TENDON SURGERY  08/20/2011  Procedure: ACHILLES TENDON REPAIR;  Surgeon: Colin Rhein, MD;  Location: [facility];  Service: Orthopedics;  Laterality: Right;  Right primary achilles tendon repair, gastroc slide . APPENDECTOMY   . CHOLECYSTECTOMY   . COLONOSCOPY   . EYE SURGERY    cataracts HPI: 83 year old male with a history of chronic respiratory failure on 4 L of oxygen, COPD, interstitial lung disease/pulmonary fibrosis, came to the hospital with worsening shortness of breath and found to have possible pneumonia, COPD exacerbation. Started on steroids, antibiotics and bronchodilators. Slowly improving. Pulmonology following. MD ordered BSE due to concerns about aspiration. Subjective: "I have trouble swallowing big pills and dry foods." Assessment / Plan / Recommendation CHL IP CLINICAL IMPRESSIONS 05/24/2016 Clinical Impression Pt seen in lateral and AP view for MBSS and assessed with the following barium tinged textures/consistencies: thin, puree, regular, barium tablet, and nectar-thick liquids. Oropharyngeal swallow essentially WNL for age with swallow trigger at the level of the valleculae across consistencies and textures. Pt with mild lingual residuals after primary swallow, however he independently swallows again to clear any residuals that have pooled in valleculae. Pt with one episode of trace penetration (underepiglottic coating) on second swallow of thin from pooled residuals. Pt was cued to clear throat and it was no longer visualized. Of note, Pt has a prominent cricopharyngeus (CP bar) with what appeared to be an outpouching just above (hypopharyngeal pouch), however no radiologist present to confirm presence of Zenker's diverticulum. Pt was turned AP and it was again visualized.  Pharyngeal transit appeared to be more to the left once past the valleculae (mild vallecular pooling noted on the right when assessed with NTL). Pt was presented with NTL to allow for greater contrast and visualization which yielded collection of the tail end of bolus and backflow (only slight) into pyriforms. No overt or gross amount of backflow noted. Recommend D3/mech soft with thin liquids with aspiration/reflux precautions: Pt to sit fully upright for all po and remain up for 30 minutes after, swallow 2x for each sip of liquid and clear throat/cough intermittently; Pt may wish to avoid dry, crumbly foods. He may have difficulty with some medications. SLP to follow up x1 for pt/family education.  SLP Visit Diagnosis Dysphagia, pharyngoesophageal phase (R13.14) Attention and concentration deficit following -- Frontal lobe and executive function deficit following -- Impact on safety and function Mild aspiration risk;Moderate aspiration risk   CHL IP TREATMENT RECOMMENDATION 05/24/2016 Treatment Recommendations Therapy as outlined in treatment plan below   Prognosis 05/24/2016 Prognosis for Safe Diet Advancement Fair Barriers to Reach Goals Severity of deficits Barriers/Prognosis Comment CP bar CHL IP DIET RECOMMENDATION 05/24/2016 SLP Diet Recommendations Dysphagia 3 (Mech soft) solids;Thin liquid Liquid Administration via Cup Medication Administration Whole meds with liquid Compensations Multiple dry swallows after each bite/sip Postural Changes Remain semi-upright after after feeds/meals (Comment);Seated upright at 90 degrees   CHL IP OTHER RECOMMENDATIONS 05/24/2016 Recommended Consults Consider esophageal assessment Oral Care Recommendations Oral care BID Other Recommendations Clarify dietary restrictions   CHL IP FOLLOW UP RECOMMENDATIONS 05/24/2016 Follow up Recommendations None   CHL IP FREQUENCY AND DURATION 05/24/2016 Speech Therapy Frequency (ACUTE ONLY) min 1 x/week Treatment Duration 1 week      CHL IP ORAL  PHASE 05/24/2016 Oral Phase Impaired Oral - Pudding Teaspoon -- Oral - Pudding Cup -- Oral - Honey Teaspoon -- Oral - Honey Cup -- Oral - Nectar Teaspoon -- Oral - Nectar Cup -- Oral - Nectar Straw -- Oral - Thin Teaspoon -- Oral - Thin Cup -- Oral - Thin Straw -- Oral - Puree -- Oral -  Mech Soft -- Oral - Regular -- Oral - Multi-Consistency -- Oral - Pill -- Oral Phase - Comment Pt generally swallowed 2x for each sip/bite  CHL IP PHARYNGEAL PHASE 05/24/2016 Pharyngeal Phase Impaired Pharyngeal- Pudding Teaspoon -- Pharyngeal -- Pharyngeal- Pudding Cup -- Pharyngeal -- Pharyngeal- Honey Teaspoon -- Pharyngeal -- Pharyngeal- Honey Cup -- Pharyngeal -- Pharyngeal- Nectar Teaspoon -- Pharyngeal -- Pharyngeal- Nectar Cup Delayed swallow initiation-vallecula;Pharyngeal residue - valleculae Pharyngeal -- Pharyngeal- Nectar Straw -- Pharyngeal -- Pharyngeal- Thin Teaspoon -- Pharyngeal -- Pharyngeal- Thin Cup Delayed swallow initiation-vallecula;Penetration/Apiration after swallow;Pharyngeal residue - valleculae Pharyngeal Material does not enter airway;Material enters airway, remains ABOVE vocal cords then ejected out Pharyngeal- Thin Straw -- Pharyngeal -- Pharyngeal- Puree Pharyngeal residue - valleculae Pharyngeal -- Pharyngeal- Mechanical Soft -- Pharyngeal -- Pharyngeal- Regular -- Pharyngeal -- Pharyngeal- Multi-consistency -- Pharyngeal -- Pharyngeal- Pill -- Pharyngeal -- Pharyngeal Comment --  CHL IP CERVICAL ESOPHAGEAL PHASE 05/24/2016 Cervical Esophageal Phase Impaired Pudding Teaspoon -- Pudding Cup -- Honey Teaspoon -- Honey Cup -- Nectar Teaspoon -- Nectar Cup -- Nectar Straw Prominent cricopharyngeal segment;Esophageal backflow into the pharynx Thin Teaspoon -- Thin Cup -- Thin Straw -- Puree Prominent cricopharyngeal segment Mechanical Soft -- Regular -- Multi-consistency -- Pill -- Cervical Esophageal Comment Prominent cricopharyngeus; suspect small Zenker's No flowsheet data found. Thank you, Genene Churn,  [facility] 05/24/2016, 6:31 PM               Medications:  Prior to Admission:  Prescriptions Prior to Admission  Medication Sig Dispense Refill Last Dose  . albuterol (PROVENTIL) (2.5 MG/3ML) 0.083% nebulizer solution Take 3 mLs (2.5 mg total) by nebulization every 6 (six) hours as needed for wheezing or shortness of breath. 75 mL 12 05/19/2016 at Unknown time  . amoxicillin-clavulanate (AUGMENTIN) 875-125 MG tablet Take 1 tablet by mouth 2 (two) times daily. 60 tablet 1 05/19/2016 at Unknown time  . aspirin EC 325 MG tablet Take 325 mg by mouth daily.   05/20/2016 at Unknown time  . atorvastatin (LIPITOR) 10 MG tablet Take 1 tablet (10 mg total) by mouth daily. 90 tablet 3 05/19/2016 at Unknown time  . Cholecalciferol (VITAMIN D) 2000 UNITS CAPS Take 2,000-4,000 Units by mouth daily. Takes 2000 units everyday except for Fri, Sat, and Sun patient takes 4000 units.   05/20/2016 at Unknown time  . citalopram (CELEXA) 20 MG tablet Take 1 tablet (20 mg total) by mouth daily. 90 tablet 3 05/20/2016 at Unknown time  . levothyroxine (SYNTHROID, LEVOTHROID) 125 MCG tablet Take 1 tablet (125 mcg total) by mouth daily.   05/20/2016 at Unknown time  . levothyroxine (SYNTHROID, LEVOTHROID) 25 MCG tablet Take 12.5 mcg by mouth. Take Monday, Wednesday and Friday with the 128mg   05/20/2016 at Unknown time  . mycophenolate (CELLCEPT) 500 MG tablet Take 2 tablets by mouth 2 (two) times daily.   05/20/2016 at Unknown time  . omeprazole (PRILOSEC) 20 MG capsule Take 1 capsule (20 mg total) by mouth every evening. 90 capsule 3 05/19/2016 at Unknown time  . predniSONE (DELTASONE) 10 MG tablet Take 15 mg by mouth.    05/19/2016 at Unknown time  . terazosin (HYTRIN) 10 MG capsule Take 1 capsule (10 mg total) by mouth at bedtime. 90 capsule 3 05/19/2016 at Unknown time   Scheduled: . acetylcysteine  3 mL Nebulization TID  . aspirin EC  325 mg Oral Daily  . atorvastatin  10 mg Oral Daily  . azithromycin   500 mg Oral Daily  .  cefTRIAXone (ROCEPHIN)  IV  1 g Intravenous Q24H  . citalopram  20 mg Oral Daily  . enoxaparin (LOVENOX) injection  40 mg Subcutaneous Q24H  . guaiFENesin  1,200 mg Oral BID  . insulin aspart  0-20 Units Subcutaneous TID WC  . insulin aspart  0-5 Units Subcutaneous QHS  . insulin glargine  10 Units Subcutaneous Daily  . ipratropium-albuterol  3 mL Nebulization Q4H WA  . levothyroxine  12.5 mcg Oral Once per day on Mon Wed Fri  . levothyroxine  125 mcg Oral QAC breakfast  . methylPREDNISolone (SOLU-MEDROL) injection  60 mg Intravenous Q6H  . mycophenolate  1,000 mg Oral BID  . pantoprazole  40 mg Oral Daily  . terazosin  10 mg Oral QHS   Continuous: . sodium chloride 0.9 % 10 mL/hr (05/24/16 0947)   QUI:[id number] **OR** acetaminophen, albuterol, ondansetron **OR** ondansetron (ZOFRAN) IV, sodium chloride  Assesment: He was admitted with acute on chronic hypoxic respiratory failure. He is still extremely short of breath. At baseline he has pulmonary fibrosis which of course will worsen with time. Additionally he has some element of COPD. He also has chronically elevated right hemidiaphragm. At his baseline he is able to walk short distances in his home but now he's not even able to use the urinal without getting extremely short of breath. He is felt to have pneumonia in addition to his chronic problems and he is being treated for that. Active Problems:   Hypothyroidism   Hyperlipidemia, acquired   PULMONARY FIBROSIS ILD POST INFLAMMATORY CHRONIC   CAP (community acquired pneumonia)   Pulmonary fibrosis ([place])   Hyperglycemia, drug-induced   COPD with exacerbation ([place])   Acute on chronic respiratory failure ([place])    Plan: Continue IV steroids IV antibiotics and inhaled bronchodilators.    LOS: 6 days   Aayushi Solorzano L 05/26/2016, 8:32 AM

## 2016-05-26 NOTE — Evaluation (Signed)
Physical Therapy Evaluation Patient Details Name: Raymond SkainsWilliam H Belling MRN: [id number] DOB: [birth year] Today's Date: 05/26/2016   History of Present Illness  83 y.o. male with medical history significant of interstitial lung disease/pulmonary fibrosis, oxygen dependent COPD, hypothyroidism who presents to the hospital with complaints of shortness of breath. Patient reports that for the past several weeks, he's had worsening shortness of breath. He's also had a productive cough for the last week. He denies any fever. No chest pain. No vomiting or diarrhea. At baseline, his functional status appears to be very poor. He is unable to walk around his house without getting short of breath. He is normally on 4 L of oxygen chronically. He's been taking nebulizer treatments at home without significant benefit. [facility] and has been on mycophenolate for the past 4 years. He is also chronically on prednisone.  Dx: PNA    Clinical Impression  Pt received sitting up in the chair, wife present, and pt is agreeable to PT evaluation.  Pt is normally independent with household ambulation.  He requires assistance for dressing and bathing.  During PT evaluation, he was able to ambulate 6010ft with no device, but with support of the O2 tank carrier.  He required standing rest break due to SOB and dizziness.  He used a RW and Min guard for ambulation 5610ft back to the chair, and SpO2 was 87% on 6L.  Pt was able to perform PLB and within 1-2 min he was able to improve SpO2 to 92%.  Pt is recommended for HHPT, and a rollator walker at this time.      Follow Up Recommendations Home health PT    Equipment Recommendations  Other (comment) (Rollator [id number])    Recommendations for Other Services       Precautions / Restrictions Precautions Precautions: Fall Precaution Comments: Due to immobility Restrictions Weight Bearing Restrictions: No      Mobility  Bed Mobility                  Transfers Overall transfer level: Needs assistance Equipment used: Ambulation equipment used (O2 carrier) Transfers: Sit to/from Stand Sit to Stand: Min guard            Ambulation/Gait Ambulation/Gait assistance: Min guard Ambulation Distance (Feet): 20 Feet Assistive device: Rolling walker (2 wheeled) (5210ft with O2 carrier, and then the last 2610ft with RW. ) Gait Pattern/deviations: Step-through pattern;Trunk flexed     General Gait Details: Pt required standing rest break leaning against the door frame due to feeling SOB and dizzy.  He used the O2 carrier for balance for the first 6810ft, and then the RW for support during the last 10610ft.  Upon return to the chair it was noted that his SpO2 was 87% on 6L  Stairs            Wheelchair Mobility    Modified Rankin (Stroke Patients Only)       Balance Overall balance assessment: Needs assistance         Standing balance support: Bilateral upper extremity supported Standing balance-Leahy Scale: Fair                               Pertinent Vitals/Pain Pain Assessment: No/denies pain    Home Living   Living Arrangements: Spouse/significant other   Type of Home: House Home Access: Stairs to enter   [employer] CorporationEntrance Stairs-Number of Steps: 3-4  Home Layout: One level Home Equipment: Cane - single point (O2 - 4L)      Prior Function     Gait / Transfers Assistance Needed: Pt is normally independent with ambulation at home, and he is normally able to drive and go to a nearby restaurant for breakfast every morning.   ADL's / Homemaking Assistance Needed: Wife assists with both dressing and bathing.         Hand Dominance   Dominant Hand: Right    Extremity/Trunk Assessment   Upper Extremity Assessment Upper Extremity Assessment: Overall WFL for tasks assessed    Lower Extremity Assessment Lower Extremity Assessment: Overall WFL for tasks assessed       Communication    Communication: No difficulties  Cognition Arousal/Alertness: Awake/alert Behavior During Therapy: WFL for tasks assessed/performed                        General Comments      Exercises     Assessment/Plan    PT Assessment Patient needs continued PT services  PT Problem List Decreased balance;Decreased mobility;Decreased activity tolerance;Cardiopulmonary status limiting activity       PT Treatment Interventions DME instruction;Gait training;Functional mobility training;Therapeutic activities;Therapeutic exercise;Balance training;Patient/family education    PT Goals (Current goals can be found in the Care Plan section)  Acute Rehab PT Goals Patient Stated Goal: To go home and be able to breathe better PT Goal Formulation: With patient/family Time For Goal Achievement: 06/02/16 Potential to Achieve Goals: Fair    Frequency Min 3X/week   Barriers to discharge        Co-evaluation               End of Session Equipment Utilized During Treatment: Gait belt;Oxygen Activity Tolerance: Patient limited by fatigue Patient left: in chair;with call bell/phone within reach Nurse Communication: Mobility status (Mobility sheet left hanging in the room. ) PT Visit Diagnosis: Muscle weakness (generalized) (M62.81);Other abnormalities of gait and mobility (R26.89)    Functional Assessment Tool Used: AM-PAC 6 Clicks Basic Mobility;Clinical judgement Functional Limitation: Mobility: Walking and moving around Mobility: Walking and Moving Around Current Status (Z6109): At least 20 percent but less than 40 percent impaired, limited or restricted Mobility: Walking and Moving Around Goal Status [phone]): At least 1 percent but less than 20 percent impaired, limited or restricted    Time: 1506-1529 PT Time Calculation (min) (ACUTE ONLY): 23 min   Charges:   PT Evaluation $PT Eval Low Complexity: 1 Procedure PT Treatments $Gait Training: 8-22 mins   PT G Codes:   PT  G-Codes **NOT FOR INPATIENT CLASS** Functional Assessment Tool Used: AM-PAC 6 Clicks Basic Mobility;Clinical judgement Functional Limitation: Mobility: Walking and moving around Mobility: Walking and Moving Around Current Status (U9811): At least 20 percent but less than 40 percent impaired, limited or restricted Mobility: Walking and Moving Around Goal Status [phone]): At least 1 percent but less than 20 percent impaired, limited or restricted     Beth Pearlene Teat, PT, DPT X: [phone]

## 2016-05-27 ENCOUNTER — Inpatient Hospital Stay (HOSPITAL_COMMUNITY): Payer: Medicare Other

## 2016-05-27 LAB — CBC WITH DIFFERENTIAL/PLATELET
BASOS ABS: 0 10*3/uL (ref 0.0–0.1)
BASOS PCT: 0 %
EOS PCT: 0 %
Eosinophils Absolute: 0 10*3/uL (ref 0.0–0.7)
HCT: 38.7 % — ABNORMAL LOW (ref 39.0–52.0)
Hemoglobin: 12 g/dL — ABNORMAL LOW (ref 13.0–17.0)
Lymphocytes Relative: 3 %
Lymphs Abs: 0.3 10*3/uL — ABNORMAL LOW (ref 0.7–4.0)
MCH: 29.8 pg (ref 26.0–34.0)
MCHC: 31 g/dL (ref 30.0–36.0)
MCV: 96 fL (ref 78.0–100.0)
MONO ABS: 0.4 10*3/uL (ref 0.1–1.0)
Monocytes Relative: 3 %
Neutro Abs: 11.9 10*3/uL — ABNORMAL HIGH (ref 1.7–7.7)
Neutrophils Relative %: 94 %
PLATELETS: 199 10*3/uL (ref 150–400)
RBC: 4.03 MIL/uL — ABNORMAL LOW (ref 4.22–5.81)
RDW: 13.3 % (ref 11.5–15.5)
WBC: 12.7 10*3/uL — ABNORMAL HIGH (ref 4.0–10.5)

## 2016-05-27 LAB — PHOSPHORUS: Phosphorus: 3.3 mg/dL (ref 2.5–4.6)

## 2016-05-27 LAB — COMPREHENSIVE METABOLIC PANEL
ALBUMIN: 2.9 g/dL — AB (ref 3.5–5.0)
ALT: 39 U/L (ref 17–63)
ANION GAP: 10 (ref 5–15)
AST: 31 U/L (ref 15–41)
Alkaline Phosphatase: 36 U/L — ABNORMAL LOW (ref 38–126)
BUN: 28 mg/dL — AB (ref 6–20)
CHLORIDE: 93 mmol/L — AB (ref 101–111)
CO2: 36 mmol/L — ABNORMAL HIGH (ref 22–32)
Calcium: 8.8 mg/dL — ABNORMAL LOW (ref 8.9–10.3)
Creatinine, Ser: 0.64 mg/dL (ref 0.61–1.24)
GFR calc Af Amer: >60 mL/min (ref >60)
Glucose, Bld: 168 mg/dL — ABNORMAL HIGH (ref 65–99)
POTASSIUM: 4.4 mmol/L (ref 3.5–5.1)
Sodium: 139 mmol/L (ref 135–145)
Total Bilirubin: 0.9 mg/dL (ref 0.3–1.2)
Total Protein: 5.4 g/dL — ABNORMAL LOW (ref 6.5–8.1)

## 2016-05-27 LAB — GLUCOSE, CAPILLARY
GLUCOSE-CAPILLARY: 224 mg/dL — AB (ref 65–99)
GLUCOSE-CAPILLARY: 281 mg/dL — AB (ref 65–99)
GLUCOSE-CAPILLARY: 215 mg/dL — AB (ref 65–99)
Glucose-Capillary: 135 mg/dL — ABNORMAL HIGH (ref 65–99)

## 2016-05-27 LAB — MAGNESIUM: MAGNESIUM: 2 mg/dL (ref 1.7–2.4)

## 2016-05-27 MED ORDER — SENNOSIDES-DOCUSATE SODIUM 8.6-50 MG PO TABS
1.0000 | ORAL_TABLET | Freq: Two times a day (BID) | ORAL | Status: DC
  Administered 2016-05-27 – 2016-06-03 (×14): 1 via ORAL
  Filled 2016-05-27 (×15): qty 1

## 2016-05-27 MED ORDER — BISACODYL 10 MG RE SUPP
10.0000 mg | Freq: Once | RECTAL | Status: DC
  Filled 2016-05-27: qty 1

## 2016-05-27 MED ORDER — POLYETHYLENE GLYCOL 3350 17 G PO PACK
17.0000 g | PACK | Freq: Two times a day (BID) | ORAL | Status: DC
  Administered 2016-05-27 – 2016-06-03 (×13): 17 g via ORAL
  Filled 2016-05-27 (×15): qty 1

## 2016-05-27 NOTE — Care Management (Signed)
    Durable Medical Equipment        Start     Ordered   05/27/16 1115  For home use only DME 4 wheeled rolling walker with seat  Once    Question:  Patient needs a walker to treat with the following condition  Answer:  Pulmonary fibrosis (HCC)   05/27/16 1115

## 2016-05-27 NOTE — Progress Notes (Signed)
Inpatient Diabetes Program Recommendations  AACE/ADA: New Consensus Statement on Inpatient Glycemic Control (2015)  Target Ranges:  Prepandial:   less than 140 mg/dL      Peak postprandial:   less than 180 mg/dL (1-2 hours)      Critically ill patients:  140 - 180 mg/dL  Results for Terri SkainsGLIDEWELL, Hayze H (MRN [id number]) as of 05/27/2016 07:59  Ref. Range 05/26/2016 07:17 05/26/2016 11:04 05/26/2016 16:15 05/26/2016 21:05  Glucose-Capillary Latest Ref Range: 65 - 99 mg/dL 409214 (H) 811311 (H) 914207 (H) 182 (H)   Results for Terri SkainsGLIDEWELL, Yoshito H (MRN [id number]) as of 05/27/2016 07:59  Ref. Range 05/25/2016 07:38 05/25/2016 11:21 05/25/2016 16:17 05/25/2016 21:04  Glucose-Capillary Latest Ref Range: 65 - 99 mg/dL 086149 (H) 578331 (H) 469198 (H) 155 (H)   Review of Glycemic Control  Current orders for Inpatient glycemic control: Lantus 10 units daily, Novolog 0-20 units TID with meals, Novolog 0-5 units QHS  Inpatient Diabetes Program Recommendations: Insulin - Meal Coverage: Post prandial glucose is consistently elevated.  If steroids are contined and patient is eating at least 50% of meals, please consider ordering Novolog 4 units TID with meals for meal coverage.  Thanks, Orlando PennerMarie Zakeya Junker, RN, MSN, CDE Diabetes Coordinator Inpatient Diabetes Program [phone] (Team Pager from 8am to 5pm)

## 2016-05-27 NOTE — Progress Notes (Signed)
PROGRESS NOTE    Raymond [name]  WUJ:[id number] DOB: Aug 19, 1929 DOA: 05/20/2016 PCP: Rudi Heap, MD   Brief Narrative:  83 year old male with a history of chronic respiratory failure on 4 L of oxygen, COPD, interstitial lung disease/pulmonary fibrosis, came to the hospital with worsening shortness of breath and found to have possible pneumonia, COPD exacerbation. Started on IV steroids, antibiotics and bronchodilators. Slowly improving and not able to expectorate much sputum. Pulmonology following and appreciate recc's.  Assessment & Plan:   Active Problems:   Hypothyroidism   Hyperlipidemia, acquired   PULMONARY FIBROSIS ILD POST INFLAMMATORY CHRONIC   CAP (community acquired pneumonia)   Pulmonary fibrosis (HCC)   Hyperglycemia, drug-induced   COPD with exacerbation (HCC)   Acute on chronic respiratory failure (HCC)  1. Acute on chronic respiratory failure with hypoxia.  -Suspect this is related to pneumonia as well as COPD/pulmonary fibrosis.  -Currently on slightly higher than baseline oxygen requirement of 4 L.  -Patient likely has a component of dysphagia. Speech therapy following the patient underwent modified barium swallow. He was found to have a small Zenker's diverticulum. Does not appear to be a good candidate for repair. He is continued on a dysphagia 3 diet.. -Added Sodium Chloride 0.9% Neb Solution 3 mL Daily as did not have Hypertonic Saline Nebs per Pharmacy yesterday. -Will try Chest PT today   2. Probable community-acquired pneumonia.  -C/w Treatment with Ceftriaxone and Azithromycin (day 8) -Influenza panel negative.  3. COPD exacerbation.  -Continue on IV Methylprednisolone 60 mg IV q6h, Abx with po Azithromycin 500 mg po Daily and Ceftriaxone 1 gram q24h and Bronchodilators; C/w IH DuoNebs, Albuterol, and Mucomyst.  -Continue pulmonary hygiene. Added Chest PT -Added 0.9% Nebulizer Solution Daily  4. Interstitial lung disease/pulmonary fibrosis.    -Continue Mycophenolate 1000 mg po BID and IV Methylprednisolone 60 mg IV q6h. -Pulmonology Dr. Juanetta Gosling following and appreciate Recc's.  -Added Chest PT -His baseline functional status appears to be quite poor.  5. Hyperglycemia.  -Felt to be related to steroids.  -Hemoglobin A1c 7.6.  -On Resistant Novolog Sliding Scale Insulin and Lantus 10 units sq daily.  -CBG's have ranged from 182-281  6. Hypothyroidism. -Continue Synthroid 12.5 mcg po MWF and Levothyroxine 125 mcg po Daily   7. Hyperlipidemia.  -Continue Atorvastatin 10 mg po Daily  8. BPH -C/w Terazosin 10 mg po qHS  9. CAD -C/w ASA 325 mg po Daily and Atorvastatin 10 mg po Daily  10. Constipation -Gave Bisacodyl 10 mg Suppository x1 -Added Senna-Docusate 1 tab po BID and Miralax 17 g po BID -May try enema if still unable to have Bowel Movement.   11. Leukocytosis -Likely Reactive from Steroid Use -Continue to Monitor and repeat CBC in AM  DVT prophylaxis: Lovenox 40 mg sq q24h Code Status: Partial Code; No CPR, No Defibrillation  Family Communication: Discussed with Wife at Bedside Disposition Plan: Home Health PT when stable with Rollator Walker  Consultants:   Pulmonology   Procedures:  None   Antimicrobials:  Anti-infectives    Start     Dose/Rate Route Frequency Ordered Stop   05/24/16 1000  azithromycin (ZITHROMAX) tablet 500 mg     500 mg Oral Daily 05/23/16 1113     05/21/16 1030  azithromycin (ZITHROMAX) 500 mg in dextrose 5 % 250 mL IVPB  Status:  Discontinued     500 mg 250 mL/hr over 60 Minutes Intravenous Every 24 hours 05/20/16 1410 05/23/16 1113   05/21/16 1000  cefTRIAXone (ROCEPHIN)  1 g in dextrose 5 % 50 mL IVPB     1 g 100 mL/hr over 30 Minutes Intravenous Every 24 hours 05/20/16 1410     05/20/16 1000  cefTRIAXone (ROCEPHIN) 1 g in dextrose 5 % 50 mL IVPB     1 g 100 mL/hr over 30 Minutes Intravenous  Once 05/20/16 0945 05/20/16 1053   05/20/16 1000  azithromycin (ZITHROMAX)  500 mg in dextrose 5 % 250 mL IVPB     500 mg 250 mL/hr over 60 Minutes Intravenous  Once 05/20/16 0945 05/20/16 1201     Subjective: Seen and examined at bedside this AM and states he felt slightly better but still SOB. States was able to cough up a little bit but did not have a Bowel Movement in several days. No other complaint or concerns at this time.    Objective: Vitals:   05/27/16 0547 05/27/16 0821 05/27/16 1151 05/27/16 1423  BP: (!) 143/73   126/76  Pulse: 98   80  Resp: 18   18  Temp: 98.1 F (36.7 C)   97.9 F (36.6 C)  TempSrc: Oral     SpO2: 100% 96% 93% 96%  Weight:      Height:        Intake/Output Summary (Last 24 hours) at 05/27/16 1816 Last data filed at 05/27/16 1755  Gross per 24 hour  Intake              600 ml  Output              700 ml  Net             -100 ml   Filed Weights   05/20/16 0914 05/20/16 1345 05/21/16 0500  Weight: 74.8 kg (165 lb) 75.2 kg (165 lb 12.6 oz) 76.7 kg (169 lb 1.5 oz)   Examination: Physical Exam:  Constitutional: NAD and appears calm and comfortable but slightly dyspneic  Eyes:  Lids and conjunctivae normal, sclerae anicteric  ENMT: External Ears, Nose appear normal. Grossly normal hearing. Neck: Appears normal, supple, no cervical masses, normal ROM, no appreciable thyromegaly Respiratory: Diminished to auscultation bilaterally with expiratory wheezing and rhonic. Slightly increased respiratory effort . No accessory muscle use but wearing O2 via NVC.  Cardiovascular: RRR, no murmurs / rubs / gallops. S1 and S2 auscultated. No extremity edema.  Abdomen: Soft, non-tender, non-distended. No masses palpated. No appreciable hepatosplenomegaly. Bowel sounds positive x4.  GU: Deferred. Musculoskeletal: No clubbing / cyanosis of digits/nails. No joint deformity upper and lower extremities. Normal strength and muscle tone.  Skin: No rashes, lesions, ulcers on limited skin evaluation. No induration; Warm and dry.  Neurologic: CN  2-12 grossly intact with no focal deficits. Romberg sign cerebellar reflexes not assessed.  Psychiatric: Normal judgment and insight. Alert and oriented x 3. Normal mood and appropriate affect.   Data Reviewed: I have personally reviewed following labs and imaging studies  CBC:  Recent Labs Lab 05/21/16 0426 05/23/16 0707 05/26/16 0444 05/27/16 0524  WBC 8.3 12.0* 11.0* 12.7*  NEUTROABS  --   --   --  11.9*  HGB 11.4* 11.1* 12.2* 12.0*  HCT 36.2* 35.5* 38.6* 38.7*  MCV 95.0 95.9 95.1 96.0  PLT 194 208 215 199   Basic Metabolic Panel:  Recent Labs Lab 05/21/16 0426 05/23/16 0707 05/26/16 0444 05/27/16 0524  NA 135 138 135 139  K 4.1 3.9 4.0 4.4  CL 97* 96* 92* 93*  CO2 30 34* 36* 36*  GLUCOSE 244* 219* 204* 168*  BUN 16 27* 25* 28*  CREATININE 0.62 0.69 0.75 0.64  CALCIUM 8.6* 8.8* 8.8* 8.8*  MG  --   --   --  2.0  PHOS  --   --   --  3.3   GFR: Estimated Creatinine Clearance: 65.9 mL/min (by C-G formula based on SCr of 0.64 mg/dL). Liver Function Tests:  Recent Labs Lab 05/27/16 0524  AST 31  ALT 39  ALKPHOS 36*  BILITOT 0.9  PROT 5.4*  ALBUMIN 2.9*   No results for input(s): LIPASE, AMYLASE in the last 168 hours. No results for input(s): AMMONIA in the last 168 hours. Coagulation Profile: No results for input(s): INR, PROTIME in the last 168 hours. Cardiac Enzymes: No results for input(s): CKTOTAL, CKMB, CKMBINDEX, TROPONINI in the last 168 hours. BNP (last 3 results) No results for input(s): PROBNP in the last 8760 hours. HbA1C: No results for input(s): HGBA1C in the last 72 hours. CBG:  Recent Labs Lab 05/26/16 1615 05/26/16 2105 05/27/16 0802 05/27/16 1144 05/27/16 1632  GLUCAP 207* 182* 224* 281* 215*   Lipid Profile: No results for input(s): CHOL, HDL, LDLCALC, TRIG, CHOLHDL, LDLDIRECT in the last 72 hours. Thyroid Function Tests: No results for input(s): TSH, T4TOTAL, FREET4, T3FREE, THYROIDAB in the last 72 hours. Anemia Panel: No  results for input(s): VITAMINB12, FOLATE, FERRITIN, TIBC, IRON, RETICCTPCT in the last 72 hours. Sepsis Labs: No results for input(s): PROCALCITON, LATICACIDVEN in the last 168 hours.  Recent Results (from the past 240 hour(s))  Blood Culture (routine x 2)     Status: None   Collection Time: 05/20/16 10:10 AM  Result Value Ref Range Status   Specimen Description BLOOD RIGHT FOREARM  Final   Special Requests BOTTLES DRAWN AEROBIC ONLY 6CC  Final   Culture NO GROWTH 5 DAYS  Final   Report Status 05/25/2016 FINAL  Final  Blood Culture (routine x 2)     Status: None   Collection Time: 05/20/16 10:20 AM  Result Value Ref Range Status   Specimen Description BLOOD LEFT ARM  Final   Special Requests BOTTLES DRAWN AEROBIC AND ANAEROBIC 6CC  Final   Culture NO GROWTH 5 DAYS  Final   Report Status 05/25/2016 FINAL  Final  MRSA PCR Screening     Status: None   Collection Time: 05/20/16  1:52 PM  Result Value Ref Range Status   MRSA by PCR NEGATIVE NEGATIVE Final    Comment:        The GeneXpert MRSA Assay (FDA approved for NASAL specimens only), is one component of a comprehensive MRSA colonization surveillance program. It is not intended to diagnose MRSA infection nor to guide or monitor treatment for MRSA infections.     Radiology Studies: Dg Chest Port 1 View  Result Date: 05/27/2016 CLINICAL DATA:  Shortness of breath . EXAM: PORTABLE CHEST 1 VIEW COMPARISON:  05/20/2016 . FINDINGS: Cardiomegaly with progressive bilateral from interstitial prominence noted consistent CHF. Pneumonitis cannot be excluded. Underlying chronic interstitial lung disease. Contrast in the colon. No acute bony abnormality . IMPRESSION: Cardiomegaly with progressive bilateral pulmonary interstitial prominence consistent CHF. Bilateral pneumonitis cannot be excluded. Underlying chronic interstitial lung disease . Electronically Signed   By: Maisie Fus  Register   On: 05/27/2016 07:27   Scheduled Meds: .  acetylcysteine  3 mL Nebulization TID  . aspirin EC  325 mg Oral Daily  . atorvastatin  10 mg Oral Daily  . azithromycin  500 mg Oral Daily  .  bisacodyl  10 mg Rectal Once  . cefTRIAXone (ROCEPHIN)  IV  1 g Intravenous Q24H  . citalopram  20 mg Oral Daily  . enoxaparin (LOVENOX) injection  40 mg Subcutaneous Q24H  . guaiFENesin  1,200 mg Oral BID  . insulin aspart  0-20 Units Subcutaneous TID WC  . insulin aspart  0-5 Units Subcutaneous QHS  . insulin glargine  10 Units Subcutaneous Daily  . ipratropium-albuterol  3 mL Nebulization Q4H WA  . levothyroxine  12.5 mcg Oral Once per day on Mon Wed Fri  . levothyroxine  125 mcg Oral QAC breakfast  . methylPREDNISolone (SOLU-MEDROL) injection  60 mg Intravenous Q6H  . mycophenolate  1,000 mg Oral BID  . pantoprazole  40 mg Oral Daily  . polyethylene glycol  17 g Oral BID  . senna-docusate  1 tablet Oral BID  . sodium chloride  3 mL Nebulization Daily  . terazosin  10 mg Oral QHS   Continuous Infusions: . sodium chloride 0.9 % 10 mL/hr (05/24/16 0947)    LOS: 7 days   Merlene Laughtermair Latif Sheikh, DO [facility] Pager [phone]  If 7PM-7AM, please contact night-coverage [email or web address] Password [email or web address] 05/27/2016, 6:16 PM

## 2016-05-27 NOTE — Care Management Note (Signed)
Case Management Note  Patient Details  Name: Raymond [name] MRN: [id number] Date of Birth: 07-27-1929  Expected Discharge Date:     05/30/2016             Expected Discharge Plan:  Home w Home Health Services  In-House Referral:  NA  Discharge planning Services  CM Consult  Post Acute Care Choice:  Durable Medical Equipment Choice offered to:  Patient  DME Arranged:  Walker rolling with seat DME Agency:  [facility]  Status of Service:  In process, will continue to follow  If discussed at Long Length of Stay Meetings, dates discussed:  05/27/2016  Additional Comments: PT has recommended HH PT and rollator. Pt agreeable to rollator at this time and would like [facility] from list of DME providers. Alroy BailiffLinda Lothian, [facility], aware of referral and will obtain pt info from chart. [facility].   Malcolm Metrohildress, Lynnix Schoneman Demske, RN 05/27/2016, 12:52 PM

## 2016-05-27 NOTE — Progress Notes (Signed)
Subjective: He says he feels a little bit better today. He had a better night. He is still short of breath. He is able to cough up a little bit of sputum yesterday. No other new complaints. No nausea vomiting diarrhea. No chest pain.  Objective: Vital signs in last 24 hours: Temp:  [97.9 F (36.6 C)-98.1 F (36.7 C)] 98.1 F (36.7 C) (03/01 0547) Pulse Rate:  [82-107] 98 (03/01 0547) Resp:  [18-20] 18 (03/01 0547) BP: (141-143)/(68-73) 143/73 (03/01 0547) SpO2:  [91 %-100 %] 96 % (03/01 0821) Weight change:  Last BM Date: 05/23/16  Intake/Output from previous day: 02/28 0701 - 03/01 0700 In: 840 [P.O.:840] Out: -   PHYSICAL EXAM General appearance: alert, cooperative and mild distress Resp: Rales bilaterally Cardio: regular rate and rhythm, S1, S2 normal, no murmur, click, rub or gallop GI: soft, non-tender; bowel sounds normal; no masses,  no organomegaly Extremities: extremities normal, atraumatic, no cyanosis or edema Skin warm and dry. Mucous membranes are moist  Lab Results:  Results for orders placed or performed during the hospital encounter of 05/20/16 (from the past 48 hour(s))  Glucose, capillary     Status: Abnormal   Collection Time: 05/25/16 11:21 AM  Result Value Ref Range   Glucose-Capillary 331 (H) 65 - 99 mg/dL  Glucose, capillary     Status: Abnormal   Collection Time: 05/25/16  4:17 PM  Result Value Ref Range   Glucose-Capillary 198 (H) 65 - 99 mg/dL  Glucose, capillary     Status: Abnormal   Collection Time: 05/25/16  9:04 PM  Result Value Ref Range   Glucose-Capillary 155 (H) 65 - 99 mg/dL   Comment 1 Notify RN    Comment 2 Document in Chart   CBC     Status: Abnormal   Collection Time: 05/26/16  4:44 AM  Result Value Ref Range   WBC 11.0 (H) 4.0 - 10.5 K/uL   RBC 4.06 (L) 4.22 - 5.81 MIL/uL   Hemoglobin 12.2 (L) 13.0 - 17.0 g/dL   HCT 28.5 (L) 49.6 - 56.5 %   MCV 95.1 78.0 - 100.0 fL   MCH 30.0 26.0 - 34.0 pg   MCHC 31.6 30.0 - 36.0 g/dL   RDW 99.4 37.1 - 90.7 %   Platelets 215 150 - 400 K/uL  Basic metabolic panel     Status: Abnormal   Collection Time: 05/26/16  4:44 AM  Result Value Ref Range   Sodium 135 135 - 145 mmol/L   Potassium 4.0 3.5 - 5.1 mmol/L   Chloride 92 (L) 101 - 111 mmol/L   CO2 36 (H) 22 - 32 mmol/L   Glucose, Bld 204 (H) 65 - 99 mg/dL   BUN 25 (H) 6 - 20 mg/dL   Creatinine, Ser 0.72 0.61 - 1.24 mg/dL   Calcium 8.8 (L) 8.9 - 10.3 mg/dL   GFR calc non Af Amer >60 >60 mL/min   GFR calc Af Amer >60 >60 mL/min    Comment: (NOTE) The eGFR has been calculated using the CKD EPI equation. This calculation has not been validated in all clinical situations. eGFR's persistently <60 mL/min signify possible Chronic Kidney Disease.    Anion gap 7 5 - 15  Glucose, capillary     Status: Abnormal   Collection Time: 05/26/16  7:17 AM  Result Value Ref Range   Glucose-Capillary 214 (H) 65 - 99 mg/dL   Comment 1 Notify RN    Comment 2 Document in Chart   Glucose,  capillary     Status: Abnormal   Collection Time: 05/26/16 11:04 AM  Result Value Ref Range   Glucose-Capillary 311 (H) 65 - 99 mg/dL   Comment 1 Notify RN    Comment 2 Document in Chart   Glucose, capillary     Status: Abnormal   Collection Time: 05/26/16  4:15 PM  Result Value Ref Range   Glucose-Capillary 207 (H) 65 - 99 mg/dL   Comment 1 Notify RN    Comment 2 Document in Chart   Glucose, capillary     Status: Abnormal   Collection Time: 05/26/16  9:05 PM  Result Value Ref Range   Glucose-Capillary 182 (H) 65 - 99 mg/dL   Comment 1 Notify RN    Comment 2 Document in Chart   CBC with Differential/Platelet     Status: Abnormal   Collection Time: 05/27/16  5:24 AM  Result Value Ref Range   WBC 12.7 (H) 4.0 - 10.5 K/uL   RBC 4.03 (L) 4.22 - 5.81 MIL/uL   Hemoglobin 12.0 (L) 13.0 - 17.0 g/dL   HCT 38.7 (L) 39.0 - 52.0 %   MCV 96.0 78.0 - 100.0 fL   MCH 29.8 26.0 - 34.0 pg   MCHC 31.0 30.0 - 36.0 g/dL   RDW 13.3 11.5 - 15.5 %   Platelets  199 150 - 400 K/uL   Neutrophils Relative % 94 %   Neutro Abs 11.9 (H) 1.7 - 7.7 K/uL   Lymphocytes Relative 3 %   Lymphs Abs 0.3 (L) 0.7 - 4.0 K/uL   Monocytes Relative 3 %   Monocytes Absolute 0.4 0.1 - 1.0 K/uL   Eosinophils Relative 0 %   Eosinophils Absolute 0.0 0.0 - 0.7 K/uL   Basophils Relative 0 %   Basophils Absolute 0.0 0.0 - 0.1 K/uL  Comprehensive metabolic panel     Status: Abnormal   Collection Time: 05/27/16  5:24 AM  Result Value Ref Range   Sodium 139 135 - 145 mmol/L   Potassium 4.4 3.5 - 5.1 mmol/L   Chloride 93 (L) 101 - 111 mmol/L   CO2 36 (H) 22 - 32 mmol/L   Glucose, Bld 168 (H) 65 - 99 mg/dL   BUN 28 (H) 6 - 20 mg/dL   Creatinine, Ser 0.64 0.61 - 1.24 mg/dL   Calcium 8.8 (L) 8.9 - 10.3 mg/dL   Total Protein 5.4 (L) 6.5 - 8.1 g/dL   Albumin 2.9 (L) 3.5 - 5.0 g/dL   AST 31 15 - 41 U/L   ALT 39 17 - 63 U/L   Alkaline Phosphatase 36 (L) 38 - 126 U/L   Total Bilirubin 0.9 0.3 - 1.2 mg/dL   GFR calc non Af Amer >60 >60 mL/min   GFR calc Af Amer >60 >60 mL/min    Comment: (NOTE) The eGFR has been calculated using the CKD EPI equation. This calculation has not been validated in all clinical situations. eGFR's persistently <60 mL/min signify possible Chronic Kidney Disease.    Anion gap 10 5 - 15  Magnesium     Status: None   Collection Time: 05/27/16  5:24 AM  Result Value Ref Range   Magnesium 2.0 1.7 - 2.4 mg/dL  Phosphorus     Status: None   Collection Time: 05/27/16  5:24 AM  Result Value Ref Range   Phosphorus 3.3 2.5 - 4.6 mg/dL  Glucose, capillary     Status: Abnormal   Collection Time: 05/27/16  8:02 AM  Result  Value Ref Range   Glucose-Capillary 224 (H) 65 - 99 mg/dL    ABGS No results for input(s): PHART, PO2ART, TCO2, HCO3 in the last 72 hours.  Invalid input(s): PCO2 CULTURES Recent Results (from the past 240 hour(s))  Blood Culture (routine x 2)     Status: None   Collection Time: 05/20/16 10:10 AM  Result Value Ref Range Status    Specimen Description BLOOD RIGHT FOREARM  Final   Special Requests BOTTLES DRAWN AEROBIC ONLY 6CC  Final   Culture NO GROWTH 5 DAYS  Final   Report Status 05/25/2016 FINAL  Final  Blood Culture (routine x 2)     Status: None   Collection Time: 05/20/16 10:20 AM  Result Value Ref Range Status   Specimen Description BLOOD LEFT ARM  Final   Special Requests BOTTLES DRAWN AEROBIC AND ANAEROBIC 6CC  Final   Culture NO GROWTH 5 DAYS  Final   Report Status 05/25/2016 FINAL  Final  MRSA PCR Screening     Status: None   Collection Time: 05/20/16  1:52 PM  Result Value Ref Range Status   MRSA by PCR NEGATIVE NEGATIVE Final    Comment:        The GeneXpert MRSA Assay (FDA approved for NASAL specimens only), is one component of a comprehensive MRSA colonization surveillance program. It is not intended to diagnose MRSA infection nor to guide or monitor treatment for MRSA infections.    Studies/Results: Dg Chest Port 1 View  Result Date: 05/27/2016 CLINICAL DATA:  Shortness of breath . EXAM: PORTABLE CHEST 1 VIEW COMPARISON:  05/20/2016 . FINDINGS: Cardiomegaly with progressive bilateral from interstitial prominence noted consistent CHF. Pneumonitis cannot be excluded. Underlying chronic interstitial lung disease. Contrast in the colon. No acute bony abnormality . IMPRESSION: Cardiomegaly with progressive bilateral pulmonary interstitial prominence consistent CHF. Bilateral pneumonitis cannot be excluded. Underlying chronic interstitial lung disease . Electronically Signed   By: Marcello Moores  Register   On: 05/27/2016 07:27    Medications:  Prior to Admission:  Prescriptions Prior to Admission  Medication Sig Dispense Refill Last Dose  . albuterol (PROVENTIL) (2.5 MG/3ML) 0.083% nebulizer solution Take 3 mLs (2.5 mg total) by nebulization every 6 (six) hours as needed for wheezing or shortness of breath. 75 mL 12 05/19/2016 at Unknown time  . amoxicillin-clavulanate (AUGMENTIN) 875-125 MG tablet  Take 1 tablet by mouth 2 (two) times daily. 60 tablet 1 05/19/2016 at Unknown time  . aspirin EC 325 MG tablet Take 325 mg by mouth daily.   05/20/2016 at Unknown time  . atorvastatin (LIPITOR) 10 MG tablet Take 1 tablet (10 mg total) by mouth daily. 90 tablet 3 05/19/2016 at Unknown time  . Cholecalciferol (VITAMIN D) 2000 UNITS CAPS Take 2,000-4,000 Units by mouth daily. Takes 2000 units everyday except for Fri, Sat, and Sun patient takes 4000 units.   05/20/2016 at Unknown time  . citalopram (CELEXA) 20 MG tablet Take 1 tablet (20 mg total) by mouth daily. 90 tablet 3 05/20/2016 at Unknown time  . levothyroxine (SYNTHROID, LEVOTHROID) 125 MCG tablet Take 1 tablet (125 mcg total) by mouth daily.   05/20/2016 at Unknown time  . levothyroxine (SYNTHROID, LEVOTHROID) 25 MCG tablet Take 12.5 mcg by mouth. Take Monday, Wednesday and Friday with the 15mg   05/20/2016 at Unknown time  . mycophenolate (CELLCEPT) 500 MG tablet Take 2 tablets by mouth 2 (two) times daily.   05/20/2016 at Unknown time  . omeprazole (PRILOSEC) 20 MG capsule Take 1 capsule (  20 mg total) by mouth every evening. 90 capsule 3 05/19/2016 at Unknown time  . predniSONE (DELTASONE) 10 MG tablet Take 15 mg by mouth.    05/19/2016 at Unknown time  . terazosin (HYTRIN) 10 MG capsule Take 1 capsule (10 mg total) by mouth at bedtime. 90 capsule 3 05/19/2016 at Unknown time   Scheduled: . acetylcysteine  3 mL Nebulization TID  . aspirin EC  325 mg Oral Daily  . atorvastatin  10 mg Oral Daily  . azithromycin  500 mg Oral Daily  . cefTRIAXone (ROCEPHIN)  IV  1 g Intravenous Q24H  . citalopram  20 mg Oral Daily  . enoxaparin (LOVENOX) injection  40 mg Subcutaneous Q24H  . guaiFENesin  1,200 mg Oral BID  . insulin aspart  0-20 Units Subcutaneous TID WC  . insulin aspart  0-5 Units Subcutaneous QHS  . insulin glargine  10 Units Subcutaneous Daily  . ipratropium-albuterol  3 mL Nebulization Q4H WA  . levothyroxine  12.5 mcg Oral Once per day on  Mon Wed Fri  . levothyroxine  125 mcg Oral QAC breakfast  . methylPREDNISolone (SOLU-MEDROL) injection  60 mg Intravenous Q6H  . mycophenolate  1,000 mg Oral BID  . pantoprazole  40 mg Oral Daily  . sodium chloride  3 mL Nebulization Daily  . terazosin  10 mg Oral QHS   Continuous: . sodium chloride 0.9 % 10 mL/hr (05/24/16 0947)   URK:[id number] **OR** acetaminophen, albuterol, ondansetron **OR** ondansetron (ZOFRAN) IV, sodium chloride  Assesment: He was admitted with acute on chronic hypoxic respiratory failure, COPD exacerbation and probable pneumonia. At baseline he has pulmonary fibrosis and has been on chronic prednisone and immunosuppressive's. He has been slow to respond but seems to be getting a little better now. Active Problems:   Hypothyroidism   Hyperlipidemia, acquired   PULMONARY FIBROSIS ILD POST INFLAMMATORY CHRONIC   CAP (community acquired pneumonia)   Pulmonary fibrosis ([place])   Hyperglycemia, drug-induced   COPD with exacerbation ([place])   Acute on chronic respiratory failure ([place])    Plan: Continue current treatments    LOS: 7 days   Raymond [name] L 05/27/2016, 8:38 AM

## 2016-05-28 LAB — COMPREHENSIVE METABOLIC PANEL
ALT: 43 U/L (ref 17–63)
AST: 36 U/L (ref 15–41)
Albumin: 2.8 g/dL — ABNORMAL LOW (ref 3.5–5.0)
Alkaline Phosphatase: 36 U/L — ABNORMAL LOW (ref 38–126)
Anion gap: 6 (ref 5–15)
BUN: 29 mg/dL — ABNORMAL HIGH (ref 6–20)
CHLORIDE: 93 mmol/L — AB (ref 101–111)
CO2: 38 mmol/L — AB (ref 22–32)
CREATININE: 0.55 mg/dL — AB (ref 0.61–1.24)
Calcium: 8.7 mg/dL — ABNORMAL LOW (ref 8.9–10.3)
GFR calc non Af Amer: >60 mL/min (ref >60)
Glucose, Bld: 157 mg/dL — ABNORMAL HIGH (ref 65–99)
POTASSIUM: 4.4 mmol/L (ref 3.5–5.1)
SODIUM: 137 mmol/L (ref 135–145)
Total Bilirubin: 1 mg/dL (ref 0.3–1.2)
Total Protein: 5.3 g/dL — ABNORMAL LOW (ref 6.5–8.1)

## 2016-05-28 LAB — CBC WITH DIFFERENTIAL/PLATELET
BASOS ABS: 0 10*3/uL (ref 0.0–0.1)
Basophils Relative: 0 %
EOS ABS: 0 10*3/uL (ref 0.0–0.7)
EOS PCT: 0 %
HCT: 37.8 % — ABNORMAL LOW (ref 39.0–52.0)
Hemoglobin: 11.8 g/dL — ABNORMAL LOW (ref 13.0–17.0)
Lymphocytes Relative: 3 %
Lymphs Abs: 0.4 10*3/uL — ABNORMAL LOW (ref 0.7–4.0)
MCH: 30 pg (ref 26.0–34.0)
MCHC: 31.2 g/dL (ref 30.0–36.0)
MCV: 96.2 fL (ref 78.0–100.0)
Monocytes Absolute: 0.5 10*3/uL (ref 0.1–1.0)
Monocytes Relative: 4 %
Neutro Abs: 11.9 10*3/uL — ABNORMAL HIGH (ref 1.7–7.7)
Neutrophils Relative %: 93 %
PLATELETS: 197 10*3/uL (ref 150–400)
RBC: 3.93 MIL/uL — AB (ref 4.22–5.81)
RDW: 13.3 % (ref 11.5–15.5)
WBC: 12.7 10*3/uL — AB (ref 4.0–10.5)

## 2016-05-28 LAB — GLUCOSE, CAPILLARY
GLUCOSE-CAPILLARY: 171 mg/dL — AB (ref 65–99)
GLUCOSE-CAPILLARY: 314 mg/dL — AB (ref 65–99)
Glucose-Capillary: 104 mg/dL — ABNORMAL HIGH (ref 65–99)
Glucose-Capillary: 232 mg/dL — ABNORMAL HIGH (ref 65–99)

## 2016-05-28 LAB — PHOSPHORUS: PHOSPHORUS: 3.2 mg/dL (ref 2.5–4.6)

## 2016-05-28 LAB — MAGNESIUM: Magnesium: 2 mg/dL (ref 1.7–2.4)

## 2016-05-28 MED ORDER — SODIUM CHLORIDE 0.9 % IV SOLN: INTRAVENOUS | Status: DC

## 2016-05-28 MED ORDER — FLEET ENEMA 7-19 GM/118ML RE ENEM
1.0000 | ENEMA | Freq: Once | RECTAL | Status: AC
  Administered 2016-05-28: 1 via RECTAL

## 2016-05-28 NOTE — Progress Notes (Signed)
PROGRESS NOTE    Raymond [name]  ZOX:[id number] DOB: [birth year] DOA: 05/20/2016 PCP: Rudi Heap, MD   Brief Narrative:  83 year old male with a history of chronic respiratory failure on 4 L of oxygen, COPD, interstitial lung disease/pulmonary fibrosis, came to the hospital with worsening shortness of breath and found to have possible pneumonia, COPD exacerbation. Started on IV steroids, antibiotics and bronchodilators. Slowly improving and not able to expectorate much sputum. Pulmonology following and appreciate recc's.  Assessment & Plan:   Active Problems:   Hypothyroidism   Hyperlipidemia, acquired   PULMONARY FIBROSIS ILD POST INFLAMMATORY CHRONIC   CAP (community acquired pneumonia)   Pulmonary fibrosis (HCC)   Hyperglycemia, drug-induced   COPD with exacerbation (HCC)   Acute on chronic respiratory failure (HCC)  1. Acute on chronic respiratory failure with hypoxia.  -Suspect this is related to pneumonia as well as COPD/pulmonary fibrosis.  -Currently on slightly higher than baseline oxygen requirement of 4 L.  -Patient likely has a component of dysphagia. Speech therapy following the patient underwent modified barium swallow. He was found to have a small Zenker's diverticulum. Does not appear to be a good candidate for repair. He is continued on a dysphagia 3 diet.. -Added Sodium Chloride 0.9% Neb Solution 3 mL Daily as did not have Hypertonic Saline Nebs per Pharmacy yesterday. -Will try Chest PT today and continue current Therapy  2. Probable community-acquired pneumonia.  -C/w Treatment with Ceftriaxone and Azithromycin (day 9) -Influenza panel negative.  3. COPD exacerbation.  -Continue on IV Methylprednisolone 60 mg IV q6h, Abx with po Azithromycin 500 mg po Daily and Ceftriaxone 1 gram q24h and Bronchodilators; C/w IH DuoNebs, Albuterol, and Mucomyst.  -Continue pulmonary hygiene. Added Chest PT -Added 0.9% Nebulizer Solution Daily  4. Interstitial lung  disease/pulmonary fibrosis.  -Continue Mycophenolate 1000 mg po BID and IV Methylprednisolone 60 mg IV q6h. -Pulmonology Dr. Juanetta Gosling following and appreciate Recc's.  -Added Chest PT and will try to continue  -His baseline functional status appears to be quite poor.  5. Hyperglycemia.  -Felt to be related to steroids.  -Hemoglobin A1c 7.6.  -On Resistant Novolog Sliding Scale Insulin and Lantus 10 units sq daily.  -CBG's have ranged from 104-314  6. Hypothyroidism. -Continue Synthroid 12.5 mcg po MWF and Levothyroxine 125 mcg po Daily   7. Hyperlipidemia.  -Continue Atorvastatin 10 mg po Daily  8. BPH -C/w Terazosin 10 mg po qHS  9. CAD -C/w ASA 325 mg po Daily and Atorvastatin 10 mg po Daily  10. Constipation -Gave Bisacodyl 10 mg Suppository x1 -Added Senna-Docusate 1 tab po BID and Miralax 17 g po BID -Ordered Fleet Enema today as patient didn't have a bowel movement   11. Leukocytosis -Likely Reactive from Steroid Use; Patient's WBC was 12.7 -Continue to Monitor and repeat CBC in AM  DVT prophylaxis: Lovenox 40 mg sq q24h Code Status: Partial Code; No CPR, No Defibrillation  Family Communication: Discussed with Wife at Bedside Disposition Plan: Home Health PT when stable with Rollator Walker  Consultants:   Pulmonology   Procedures:  None   Antimicrobials:  Anti-infectives    Start     Dose/Rate Route Frequency Ordered Stop   05/24/16 1000  azithromycin (ZITHROMAX) tablet 500 mg     500 mg Oral Daily 05/23/16 1113     05/21/16 1030  azithromycin (ZITHROMAX) 500 mg in dextrose 5 % 250 mL IVPB  Status:  Discontinued     500 mg 250 mL/hr over 60 Minutes Intravenous Every  24 hours 05/20/16 1410 05/23/16 1113   05/21/16 1000  cefTRIAXone (ROCEPHIN) 1 g in dextrose 5 % 50 mL IVPB     1 g 100 mL/hr over 30 Minutes Intravenous Every 24 hours 05/20/16 1410     05/20/16 1000  cefTRIAXone (ROCEPHIN) 1 g in dextrose 5 % 50 mL IVPB     1 g 100 mL/hr over 30  Minutes Intravenous  Once 05/20/16 0945 05/20/16 1053   05/20/16 1000  azithromycin (ZITHROMAX) 500 mg in dextrose 5 % 250 mL IVPB     500 mg 250 mL/hr over 60 Minutes Intravenous  Once 05/20/16 0945 05/20/16 1201     Subjective: Seen and examined at bedside this AM and stated he was still feeling SOB. Still not had a bowel movement. No other complaints or concerns at this time and still diffusely wheezing.   Objective: Vitals:   05/28/16 1159 05/28/16 1203 05/28/16 1519 05/28/16 1700  BP:    (!) 138/58  Pulse:    94  Resp:    18  Temp:    97.4 F (36.3 C)  TempSrc:    Oral  SpO2: 98% 100% 98% 99%  Weight:      Height:        Intake/Output Summary (Last 24 hours) at 05/28/16 1751 Last data filed at 05/28/16 1750  Gross per 24 hour  Intake              960 ml  Output              400 ml  Net              560 ml   Filed Weights   05/20/16 0914 05/20/16 1345 05/21/16 0500  Weight: 74.8 kg (165 lb) 75.2 kg (165 lb 12.6 oz) 76.7 kg (169 lb 1.5 oz)   Examination: Physical Exam:  Constitutional: NAD and appears calm and comfortable but slightly dyspneic  Eyes:  Lids and conjunctivae normal, sclerae anicteric  ENMT: External Ears, Nose appear normal. Grossly normal hearing. Neck: Appears normal, supple, no cervical masses, normal ROM, no appreciable thyromegaly Respiratory: Diminished to auscultation bilaterally with expiratory wheezing and rhodochrous . Slightly increased respiratory effort . No accessory muscle use but wearing O2 via NVC.  Cardiovascular: RRR, no murmurs / rubs / gallops. S1 and S2 auscultated. No extremity edema.  Abdomen: Soft, non-tender, non-distended. No masses palpated. No appreciable hepatosplenomegaly. Bowel sounds positive x4.  GU: Deferred. Musculoskeletal: No clubbing / cyanosis of digits/nails. No joint deformity upper and lower extremities. Normal strength and muscle tone.  Skin: No rashes, lesions, ulcers on limited skin evaluation. No  induration; Warm and dry.  Neurologic: CN 2-12 grossly intact with no focal deficits. Romberg sign cerebellar reflexes not assessed.  Psychiatric: Normal judgment and insight. Alert and oriented x 3. Normal mood and appropriate affect.   Data Reviewed: I have personally reviewed following labs and imaging studies  CBC:  Recent Labs Lab 05/23/16 0707 05/26/16 0444 05/27/16 0524 05/28/16 0455  WBC 12.0* 11.0* 12.7* 12.7*  NEUTROABS  --   --  11.9* 11.9*  HGB 11.1* 12.2* 12.0* 11.8*  HCT 35.5* 38.6* 38.7* 37.8*  MCV 95.9 95.1 96.0 96.2  PLT 208 215 199 197   Basic Metabolic Panel:  Recent Labs Lab 05/23/16 0707 05/26/16 0444 05/27/16 0524 05/28/16 0455  NA 138 135 139 137  K 3.9 4.0 4.4 4.4  CL 96* 92* 93* 93*  CO2 34* 36* 36* 38*  GLUCOSE 219*  204* 168* 157*  BUN 27* 25* 28* 29*  CREATININE 0.69 0.75 0.64 0.55*  CALCIUM 8.8* 8.8* 8.8* 8.7*  MG  --   --  2.0 2.0  PHOS  --   --  3.3 3.2   GFR: Estimated Creatinine Clearance: 65.9 mL/min (by C-G formula based on SCr of 0.55 mg/dL (L)). Liver Function Tests:  Recent Labs Lab 05/27/16 0524 05/28/16 0455  AST 31 36  ALT 39 43  ALKPHOS 36* 36*  BILITOT 0.9 1.0  PROT 5.4* 5.3*  ALBUMIN 2.9* 2.8*   No results for input(s): LIPASE, AMYLASE in the last 168 hours. No results for input(s): AMMONIA in the last 168 hours. Coagulation Profile: No results for input(s): INR, PROTIME in the last 168 hours. Cardiac Enzymes: No results for input(s): CKTOTAL, CKMB, CKMBINDEX, TROPONINI in the last 168 hours. BNP (last 3 results) No results for input(s): PROBNP in the last 8760 hours. HbA1C: No results for input(s): HGBA1C in the last 72 hours. CBG:  Recent Labs Lab 05/27/16 1632 05/27/16 2207 05/28/16 0729 05/28/16 1120 05/28/16 1638  GLUCAP 215* 135* 171* 314* 104*   Lipid Profile: No results for input(s): CHOL, HDL, LDLCALC, TRIG, CHOLHDL, LDLDIRECT in the last 72 hours. Thyroid Function Tests: No results for  input(s): TSH, T4TOTAL, FREET4, T3FREE, THYROIDAB in the last 72 hours. Anemia Panel: No results for input(s): VITAMINB12, FOLATE, FERRITIN, TIBC, IRON, RETICCTPCT in the last 72 hours. Sepsis Labs: No results for input(s): PROCALCITON, LATICACIDVEN in the last 168 hours.  Recent Results (from the past 240 hour(s))  Blood Culture (routine x 2)     Status: None   Collection Time: 05/20/16 10:10 AM  Result Value Ref Range Status   Specimen Description BLOOD RIGHT FOREARM  Final   Special Requests BOTTLES DRAWN AEROBIC ONLY 6CC  Final   Culture NO GROWTH 5 DAYS  Final   Report Status 05/25/2016 FINAL  Final  Blood Culture (routine x 2)     Status: None   Collection Time: 05/20/16 10:20 AM  Result Value Ref Range Status   Specimen Description BLOOD LEFT ARM  Final   Special Requests BOTTLES DRAWN AEROBIC AND ANAEROBIC 6CC  Final   Culture NO GROWTH 5 DAYS  Final   Report Status 05/25/2016 FINAL  Final  MRSA PCR Screening     Status: None   Collection Time: 05/20/16  1:52 PM  Result Value Ref Range Status   MRSA by PCR NEGATIVE NEGATIVE Final    Comment:        The GeneXpert MRSA Assay (FDA approved for NASAL specimens only), is one component of a comprehensive MRSA colonization surveillance program. It is not intended to diagnose MRSA infection nor to guide or monitor treatment for MRSA infections.     Radiology Studies: Dg Chest Port 1 View  Result Date: 05/27/2016 CLINICAL DATA:  Shortness of breath . EXAM: PORTABLE CHEST 1 VIEW COMPARISON:  05/20/2016 . FINDINGS: Cardiomegaly with progressive bilateral from interstitial prominence noted consistent CHF. Pneumonitis cannot be excluded. Underlying chronic interstitial lung disease. Contrast in the colon. No acute bony abnormality . IMPRESSION: Cardiomegaly with progressive bilateral pulmonary interstitial prominence consistent CHF. Bilateral pneumonitis cannot be excluded. Underlying chronic interstitial lung disease .  Electronically Signed   By: Maisie Fus  Register   On: 05/27/2016 07:27   Scheduled Meds: . acetylcysteine  3 mL Nebulization TID  . aspirin EC  325 mg Oral Daily  . atorvastatin  10 mg Oral Daily  . azithromycin  500  mg Oral Daily  . bisacodyl  10 mg Rectal Once  . cefTRIAXone (ROCEPHIN)  IV  1 g Intravenous Q24H  . citalopram  20 mg Oral Daily  . enoxaparin (LOVENOX) injection  40 mg Subcutaneous Q24H  . guaiFENesin  1,200 mg Oral BID  . insulin aspart  0-20 Units Subcutaneous TID WC  . insulin aspart  0-5 Units Subcutaneous QHS  . insulin glargine  10 Units Subcutaneous Daily  . ipratropium-albuterol  3 mL Nebulization Q4H WA  . levothyroxine  12.5 mcg Oral Once per day on Mon Wed Fri  . levothyroxine  125 mcg Oral QAC breakfast  . methylPREDNISolone (SOLU-MEDROL) injection  60 mg Intravenous Q6H  . mycophenolate  1,000 mg Oral BID  . pantoprazole  40 mg Oral Daily  . polyethylene glycol  17 g Oral BID  . senna-docusate  1 tablet Oral BID  . sodium chloride  3 mL Nebulization Daily  . terazosin  10 mg Oral QHS   Continuous Infusions: . sodium chloride      LOS: 8 days   Merlene Laughter, DO [facility] Pager [phone]  If 7PM-7AM, please contact night-coverage [email or web address] Password [facility] 05/28/2016, 5:51 PM

## 2016-05-28 NOTE — Progress Notes (Signed)
Subjective: He says he feels a little better but he is still very short of breath with minimal exertion. He's not able to cough much up. No other new complaints. No nausea vomiting diarrhea.  Objective: Vital signs in last 24 hours: Temp:  [97.9 F (36.6 C)-98 F (36.7 C)] 98 F (36.7 C) (03/02 0556) Pulse Rate:  [74-95] 95 (03/02 0556) Resp:  [18] 18 (03/02 0556) BP: (126-146)/(71-80) 146/80 (03/02 0556) SpO2:  [93 %-99 %] 94 % (03/02 0605) Weight change:  Last BM Date: 05/23/16  Intake/Output from previous day: 03/01 0701 - 03/02 0700 In: 600 [P.O.:600] Out: 1000 [Urine:1000]  PHYSICAL EXAM General appearance: alert, cooperative and moderate distress Resp: Bilateral rales with more rhonchi today Cardio: regular rate and rhythm, S1, S2 normal, no murmur, click, rub or gallop GI: soft, non-tender; bowel sounds normal; no masses,  no organomegaly Extremities: extremities normal, atraumatic, no cyanosis or edema Skin warm and dry. Mucous membranes are moist  Lab Results:  Results for orders placed or performed during the hospital encounter of 05/20/16 (from the past 48 hour(s))  Glucose, capillary     Status: Abnormal   Collection Time: 05/26/16 11:04 AM  Result Value Ref Range   Glucose-Capillary 311 (H) 65 - 99 mg/dL   Comment 1 Notify RN    Comment 2 Document in Chart   Glucose, capillary     Status: Abnormal   Collection Time: 05/26/16  4:15 PM  Result Value Ref Range   Glucose-Capillary 207 (H) 65 - 99 mg/dL   Comment 1 Notify RN    Comment 2 Document in Chart   Glucose, capillary     Status: Abnormal   Collection Time: 05/26/16  9:05 PM  Result Value Ref Range   Glucose-Capillary 182 (H) 65 - 99 mg/dL   Comment 1 Notify RN    Comment 2 Document in Chart   CBC with Differential/Platelet     Status: Abnormal   Collection Time: 05/27/16  5:24 AM  Result Value Ref Range   WBC 12.7 (H) 4.0 - 10.5 K/uL   RBC 4.03 (L) 4.22 - 5.81 MIL/uL   Hemoglobin 12.0 (L) 13.0 -  17.0 g/dL   HCT 38.7 (L) 39.0 - 52.0 %   MCV 96.0 78.0 - 100.0 fL   MCH 29.8 26.0 - 34.0 pg   MCHC 31.0 30.0 - 36.0 g/dL   RDW 13.3 11.5 - 15.5 %   Platelets 199 150 - 400 K/uL   Neutrophils Relative % 94 %   Neutro Abs 11.9 (H) 1.7 - 7.7 K/uL   Lymphocytes Relative 3 %   Lymphs Abs 0.3 (L) 0.7 - 4.0 K/uL   Monocytes Relative 3 %   Monocytes Absolute 0.4 0.1 - 1.0 K/uL   Eosinophils Relative 0 %   Eosinophils Absolute 0.0 0.0 - 0.7 K/uL   Basophils Relative 0 %   Basophils Absolute 0.0 0.0 - 0.1 K/uL  Comprehensive metabolic panel     Status: Abnormal   Collection Time: 05/27/16  5:24 AM  Result Value Ref Range   Sodium 139 135 - 145 mmol/L   Potassium 4.4 3.5 - 5.1 mmol/L   Chloride 93 (L) 101 - 111 mmol/L   CO2 36 (H) 22 - 32 mmol/L   Glucose, Bld 168 (H) 65 - 99 mg/dL   BUN 28 (H) 6 - 20 mg/dL   Creatinine, Ser 0.64 0.61 - 1.24 mg/dL   Calcium 8.8 (L) 8.9 - 10.3 mg/dL   Total Protein 5.4 (  L) 6.5 - 8.1 g/dL   Albumin 2.9 (L) 3.5 - 5.0 g/dL   AST 31 15 - 41 U/L   ALT 39 17 - 63 U/L   Alkaline Phosphatase 36 (L) 38 - 126 U/L   Total Bilirubin 0.9 0.3 - 1.2 mg/dL   GFR calc non Af Amer >60 >60 mL/min   GFR calc Af Amer >60 >60 mL/min    Comment: (NOTE) The eGFR has been calculated using the CKD EPI equation. This calculation has not been validated in all clinical situations. eGFR's persistently <60 mL/min signify possible Chronic Kidney Disease.    Anion gap 10 5 - 15  Magnesium     Status: None   Collection Time: 05/27/16  5:24 AM  Result Value Ref Range   Magnesium 2.0 1.7 - 2.4 mg/dL  Phosphorus     Status: None   Collection Time: 05/27/16  5:24 AM  Result Value Ref Range   Phosphorus 3.3 2.5 - 4.6 mg/dL  Glucose, capillary     Status: Abnormal   Collection Time: 05/27/16  8:02 AM  Result Value Ref Range   Glucose-Capillary 224 (H) 65 - 99 mg/dL  Glucose, capillary     Status: Abnormal   Collection Time: 05/27/16 11:44 AM  Result Value Ref Range    Glucose-Capillary 281 (H) 65 - 99 mg/dL  Glucose, capillary     Status: Abnormal   Collection Time: 05/27/16  4:32 PM  Result Value Ref Range   Glucose-Capillary 215 (H) 65 - 99 mg/dL   Comment 1 Notify RN   Glucose, capillary     Status: Abnormal   Collection Time: 05/27/16 10:07 PM  Result Value Ref Range   Glucose-Capillary 135 (H) 65 - 99 mg/dL   Comment 1 Notify RN    Comment 2 Document in Chart   CBC with Differential/Platelet     Status: Abnormal   Collection Time: 05/28/16  4:55 AM  Result Value Ref Range   WBC 12.7 (H) 4.0 - 10.5 K/uL   RBC 3.93 (L) 4.22 - 5.81 MIL/uL   Hemoglobin 11.8 (L) 13.0 - 17.0 g/dL   HCT 37.8 (L) 39.0 - 52.0 %   MCV 96.2 78.0 - 100.0 fL   MCH 30.0 26.0 - 34.0 pg   MCHC 31.2 30.0 - 36.0 g/dL   RDW 13.3 11.5 - 15.5 %   Platelets 197 150 - 400 K/uL   Neutrophils Relative % 93 %   Neutro Abs 11.9 (H) 1.7 - 7.7 K/uL   Lymphocytes Relative 3 %   Lymphs Abs 0.4 (L) 0.7 - 4.0 K/uL   Monocytes Relative 4 %   Monocytes Absolute 0.5 0.1 - 1.0 K/uL   Eosinophils Relative 0 %   Eosinophils Absolute 0.0 0.0 - 0.7 K/uL   Basophils Relative 0 %   Basophils Absolute 0.0 0.0 - 0.1 K/uL  Comprehensive metabolic panel     Status: Abnormal   Collection Time: 05/28/16  4:55 AM  Result Value Ref Range   Sodium 137 135 - 145 mmol/L   Potassium 4.4 3.5 - 5.1 mmol/L   Chloride 93 (L) 101 - 111 mmol/L   CO2 38 (H) 22 - 32 mmol/L   Glucose, Bld 157 (H) 65 - 99 mg/dL   BUN 29 (H) 6 - 20 mg/dL   Creatinine, Ser 0.55 (L) 0.61 - 1.24 mg/dL   Calcium 8.7 (L) 8.9 - 10.3 mg/dL   Total Protein 5.3 (L) 6.5 - 8.1 g/dL   Albumin 2.8 (  L) 3.5 - 5.0 g/dL   AST 36 15 - 41 U/L   ALT 43 17 - 63 U/L   Alkaline Phosphatase 36 (L) 38 - 126 U/L   Total Bilirubin 1.0 0.3 - 1.2 mg/dL   GFR calc non Af Amer >60 >60 mL/min   GFR calc Af Amer >60 >60 mL/min    Comment: (NOTE) The eGFR has been calculated using the CKD EPI equation. This calculation has not been validated in all  clinical situations. eGFR's persistently <60 mL/min signify possible Chronic Kidney Disease.    Anion gap 6 5 - 15  Magnesium     Status: None   Collection Time: 05/28/16  4:55 AM  Result Value Ref Range   Magnesium 2.0 1.7 - 2.4 mg/dL  Phosphorus     Status: None   Collection Time: 05/28/16  4:55 AM  Result Value Ref Range   Phosphorus 3.2 2.5 - 4.6 mg/dL  Glucose, capillary     Status: Abnormal   Collection Time: 05/28/16  7:29 AM  Result Value Ref Range   Glucose-Capillary 171 (H) 65 - 99 mg/dL    ABGS No results for input(s): PHART, PO2ART, TCO2, HCO3 in the last 72 hours.  Invalid input(s): PCO2 CULTURES Recent Results (from the past 240 hour(s))  Blood Culture (routine x 2)     Status: None   Collection Time: 05/20/16 10:10 AM  Result Value Ref Range Status   Specimen Description BLOOD RIGHT FOREARM  Final   Special Requests BOTTLES DRAWN AEROBIC ONLY 6CC  Final   Culture NO GROWTH 5 DAYS  Final   Report Status 05/25/2016 FINAL  Final  Blood Culture (routine x 2)     Status: None   Collection Time: 05/20/16 10:20 AM  Result Value Ref Range Status   Specimen Description BLOOD LEFT ARM  Final   Special Requests BOTTLES DRAWN AEROBIC AND ANAEROBIC 6CC  Final   Culture NO GROWTH 5 DAYS  Final   Report Status 05/25/2016 FINAL  Final  MRSA PCR Screening     Status: None   Collection Time: 05/20/16  1:52 PM  Result Value Ref Range Status   MRSA by PCR NEGATIVE NEGATIVE Final    Comment:        The GeneXpert MRSA Assay (FDA approved for NASAL specimens only), is one component of a comprehensive MRSA colonization surveillance program. It is not intended to diagnose MRSA infection nor to guide or monitor treatment for MRSA infections.    Studies/Results: Dg Chest Port 1 View  Result Date: 05/27/2016 CLINICAL DATA:  Shortness of breath . EXAM: PORTABLE CHEST 1 VIEW COMPARISON:  05/20/2016 . FINDINGS: Cardiomegaly with progressive bilateral from interstitial  prominence noted consistent CHF. Pneumonitis cannot be excluded. Underlying chronic interstitial lung disease. Contrast in the colon. No acute bony abnormality . IMPRESSION: Cardiomegaly with progressive bilateral pulmonary interstitial prominence consistent CHF. Bilateral pneumonitis cannot be excluded. Underlying chronic interstitial lung disease . Electronically Signed   By: Maisie Fus  Register   On: 05/27/2016 07:27    Medications:  Prior to Admission:  Prescriptions Prior to Admission  Medication Sig Dispense Refill Last Dose  . albuterol (PROVENTIL) (2.5 MG/3ML) 0.083% nebulizer solution Take 3 mLs (2.5 mg total) by nebulization every 6 (six) hours as needed for wheezing or shortness of breath. 75 mL 12 05/19/2016 at Unknown time  . amoxicillin-clavulanate (AUGMENTIN) 875-125 MG tablet Take 1 tablet by mouth 2 (two) times daily. 60 tablet 1 05/19/2016 at Unknown time  .  aspirin EC 325 MG tablet Take 325 mg by mouth daily.   05/20/2016 at Unknown time  . atorvastatin (LIPITOR) 10 MG tablet Take 1 tablet (10 mg total) by mouth daily. 90 tablet 3 05/19/2016 at Unknown time  . Cholecalciferol (VITAMIN D) 2000 UNITS CAPS Take 2,000-4,000 Units by mouth daily. Takes 2000 units everyday except for Fri, Sat, and Sun patient takes 4000 units.   05/20/2016 at Unknown time  . citalopram (CELEXA) 20 MG tablet Take 1 tablet (20 mg total) by mouth daily. 90 tablet 3 05/20/2016 at Unknown time  . levothyroxine (SYNTHROID, LEVOTHROID) 125 MCG tablet Take 1 tablet (125 mcg total) by mouth daily.   05/20/2016 at Unknown time  . levothyroxine (SYNTHROID, LEVOTHROID) 25 MCG tablet Take 12.5 mcg by mouth. Take Monday, Wednesday and Friday with the 133mg   05/20/2016 at Unknown time  . mycophenolate (CELLCEPT) 500 MG tablet Take 2 tablets by mouth 2 (two) times daily.   05/20/2016 at Unknown time  . omeprazole (PRILOSEC) 20 MG capsule Take 1 capsule (20 mg total) by mouth every evening. 90 capsule 3 05/19/2016 at Unknown time   . predniSONE (DELTASONE) 10 MG tablet Take 15 mg by mouth.    05/19/2016 at Unknown time  . terazosin (HYTRIN) 10 MG capsule Take 1 capsule (10 mg total) by mouth at bedtime. 90 capsule 3 05/19/2016 at Unknown time   Scheduled: . acetylcysteine  3 mL Nebulization TID  . aspirin EC  325 mg Oral Daily  . atorvastatin  10 mg Oral Daily  . azithromycin  500 mg Oral Daily  . bisacodyl  10 mg Rectal Once  . cefTRIAXone (ROCEPHIN)  IV  1 g Intravenous Q24H  . citalopram  20 mg Oral Daily  . enoxaparin (LOVENOX) injection  40 mg Subcutaneous Q24H  . guaiFENesin  1,200 mg Oral BID  . insulin aspart  0-20 Units Subcutaneous TID WC  . insulin aspart  0-5 Units Subcutaneous QHS  . insulin glargine  10 Units Subcutaneous Daily  . ipratropium-albuterol  3 mL Nebulization Q4H WA  . levothyroxine  12.5 mcg Oral Once per day on Mon Wed Fri  . levothyroxine  125 mcg Oral QAC breakfast  . methylPREDNISolone (SOLU-MEDROL) injection  60 mg Intravenous Q6H  . mycophenolate  1,000 mg Oral BID  . pantoprazole  40 mg Oral Daily  . polyethylene glycol  17 g Oral BID  . senna-docusate  1 tablet Oral BID  . sodium chloride  3 mL Nebulization Daily  . terazosin  10 mg Oral QHS   Continuous: . sodium chloride 0.9 % 10 mL/hr (05/24/16 0947)   PMEQ:[id number]**OR** acetaminophen, albuterol, ondansetron **OR** ondansetron (ZOFRAN) IV, sodium chloride  Assesment: He has acute on chronic respiratory failure with exacerbation of pulmonary fibrosis and COPD. He is making very slow improvement. He is being treated for pneumonia which is appropriate. We can't tell from his chest x-ray whether he actually has pneumonia or not Active Problems:   Hypothyroidism   Hyperlipidemia, acquired   PULMONARY FIBROSIS ILD POST INFLAMMATORY CHRONIC   CAP (community acquired pneumonia)   Pulmonary fibrosis ([place]   Hyperglycemia, drug-induced   COPD with exacerbation ([place]   Acute on chronic respiratory failure  ([place]    Plan: Continue treatments. He is on chronic steroids at home. He's not been able to cough anything up.  I will be out of town until the 12th    LOS: 8 days   Phoenyx Melka L 05/28/2016, 8:44 AM

## 2016-05-28 NOTE — Progress Notes (Signed)
Inpatient Diabetes Program Recommendations  AACE/ADA: New Consensus Statement on Inpatient Glycemic Control (2015)  Target Ranges:  Prepandial:   less than 140 mg/dL      Peak postprandial:   less than 180 mg/dL (1-2 hours)      Critically ill patients:  140 - 180 mg/dL   Results for Raymond SkainsGLIDEWELL, Edu H (MRN [id number]) as of 05/28/2016 14:05  Ref. Range 05/28/2016 07:29 05/28/2016 11:20  Glucose-Capillary Latest Ref Range: 65 - 99 mg/dL 409171 (H) 811314 (H)   Review of Glycemic Control  Current orders for Inpatient glycemic control: Lantus 10 units daily, Novolog 0-20 units TID with meals, Novolog 0-5 units QHS  Inpatient Diabetes Program Recommendations:  Glucose increased significantly after meals. Please consider ordering Novolog 6 units TID meal coverage in addition to correction scale.  Thanks,  Christena DeemShannon Raschelle Wisenbaker RN, MSN, [facility] Inpatient Diabetes Coordinator Team Pager [phone] (8a-5p)

## 2016-05-28 NOTE — Care Management Important Message (Signed)
Important Message  Patient Details  Name: Raymond SkainsWilliam H [name] MRN: [id number] Date of Birth: 02-13-1930   Medicare Important Message Given:  Yes    Malcolm MetroChildress, Chandelle Harkey Demske, RN 05/28/2016, 9:36 AM

## 2016-05-28 NOTE — Progress Notes (Signed)
PT Cancellation Note  Patient Details Name: Raymond SkainsWilliam H [name] MRN: [id number] DOB: [birth year]   Cancelled Treatment:    Reason Eval/Treat Not Completed: Other (comment) (Pt expressed that he was not feeling up to it today.  He is very SOB, and unable to speak in complete sentences.  Wife expressed that he had wanted to get a full bath today, but is not able due to his SOB.  He states that he has been up to the chair most of the day.  Encouraged pt to continue mobilizing with nursing staff.)   Carollee HerterBeth Matylda Fehring, PT, DPT X: [phone]

## 2016-05-29 ENCOUNTER — Other Ambulatory Visit: Payer: Self-pay

## 2016-05-29 LAB — COMPREHENSIVE METABOLIC PANEL
ALK PHOS: 40 U/L (ref 38–126)
ALT: 47 U/L (ref 17–63)
AST: 32 U/L (ref 15–41)
Albumin: 2.9 g/dL — ABNORMAL LOW (ref 3.5–5.0)
Anion gap: 5 (ref 5–15)
BUN: 28 mg/dL — ABNORMAL HIGH (ref 6–20)
CALCIUM: 8.8 mg/dL — AB (ref 8.9–10.3)
CO2: 39 mmol/L — AB (ref 22–32)
CREATININE: 0.57 mg/dL — AB (ref 0.61–1.24)
Chloride: 93 mmol/L — ABNORMAL LOW (ref 101–111)
Glucose, Bld: 200 mg/dL — ABNORMAL HIGH (ref 65–99)
Potassium: 4 mmol/L (ref 3.5–5.1)
SODIUM: 137 mmol/L (ref 135–145)
Total Bilirubin: 0.8 mg/dL (ref 0.3–1.2)
Total Protein: 5.1 g/dL — ABNORMAL LOW (ref 6.5–8.1)

## 2016-05-29 LAB — CBC WITH DIFFERENTIAL/PLATELET
Basophils Absolute: 0 10*3/uL (ref 0.0–0.1)
Basophils Relative: 0 %
Eosinophils Absolute: 0 10*3/uL (ref 0.0–0.7)
Eosinophils Relative: 0 %
HEMATOCRIT: 36.8 % — AB (ref 39.0–52.0)
HEMOGLOBIN: 11.5 g/dL — AB (ref 13.0–17.0)
LYMPHS ABS: 0.2 10*3/uL — AB (ref 0.7–4.0)
LYMPHS PCT: 2 %
MCH: 30.3 pg (ref 26.0–34.0)
MCHC: 31.3 g/dL (ref 30.0–36.0)
MCV: 97.1 fL (ref 78.0–100.0)
Monocytes Absolute: 0.5 10*3/uL (ref 0.1–1.0)
Monocytes Relative: 4 %
NEUTROS ABS: 10.9 10*3/uL — AB (ref 1.7–7.7)
NEUTROS PCT: 94 %
Platelets: 198 10*3/uL (ref 150–400)
RBC: 3.79 MIL/uL — AB (ref 4.22–5.81)
RDW: 13.4 % (ref 11.5–15.5)
WBC: 11.6 10*3/uL — AB (ref 4.0–10.5)

## 2016-05-29 LAB — PHOSPHORUS: PHOSPHORUS: 3.3 mg/dL (ref 2.5–4.6)

## 2016-05-29 LAB — GLUCOSE, CAPILLARY
GLUCOSE-CAPILLARY: 267 mg/dL — AB (ref 65–99)
Glucose-Capillary: 183 mg/dL — ABNORMAL HIGH (ref 65–99)
Glucose-Capillary: 234 mg/dL — ABNORMAL HIGH (ref 65–99)
Glucose-Capillary: 122 mg/dL — ABNORMAL HIGH (ref 65–99)

## 2016-05-29 LAB — MAGNESIUM: Magnesium: 1.9 mg/dL (ref 1.7–2.4)

## 2016-05-29 MED ORDER — FUROSEMIDE 10 MG/ML IJ SOLN
40.0000 mg | Freq: Once | INTRAMUSCULAR | Status: AC
  Administered 2016-05-29: 40 mg via INTRAVENOUS
  Filled 2016-05-29: qty 4

## 2016-05-29 NOTE — Progress Notes (Signed)
PROGRESS NOTE    Raymond SkainsWilliam H [name]  UJW:[id number] DOB: 12/30/1929 DOA: 05/20/2016 PCP: Rudi HeapMOORE, DONALD, MD   Brief Narrative:  83 year old male with a history of chronic respiratory failure on 4 L of oxygen, COPD, interstitial lung disease/pulmonary fibrosis, came to the hospital with worsening shortness of breath and found to have possible pneumonia, COPD exacerbation. Started on IV steroids, antibiotics and bronchodilators. Slowly improving and not able to expectorate much sputum. Pulmonology following and appreciate recc's. Still remains very SOB.   Assessment & Plan:   Active Problems:   Hypothyroidism   Hyperlipidemia, acquired   PULMONARY FIBROSIS ILD POST INFLAMMATORY CHRONIC   CAP (community acquired pneumonia)   Pulmonary fibrosis (HCC)   Hyperglycemia, drug-induced   COPD with exacerbation (HCC)   Acute on chronic respiratory failure (HCC)  1. Acute on chronic respiratory failure with hypoxia.  -Suspect this is related to pneumonia as well as COPD/pulmonary fibrosis.  -Currently on slightly higher than baseline oxygen requirement of 4 L.  -Patient likely has a component of dysphagia. Speech therapy following the patient underwent modified barium swallow. He was found to have a small Zenker's diverticulum. Does not appear to be a good candidate for repair. He is continued on a dysphagia 3 diet.. -Added Sodium Chloride 0.9% Neb Solution 3 mL Daily -Will try Chest PT today and continue current Therapy -States he was more SOB today so gave dose of IV Lasix  2. Probable community-acquired pneumonia.  -Will D/CTreatment with Ceftriaxone and Azithromycin as patient will have completed course (day 10) -Influenza panel negative.  3. COPD exacerbation.  -Continue on IV Methylprednisolone 60 mg IV q6h, Abx with po Azithromycin 500 mg po Daily and Ceftriaxone 1 gram q24h and Bronchodilators; C/w IH DuoNebs, Albuterol, and Mucomyst.  -Continue pulmonary hygiene. Added Chest  PT -Added 0.9% Nebulizer Solution Daily -Will give 1 dose of IV Lasix  4. Interstitial lung disease/pulmonary fibrosis.  -Continue Mycophenolate 1000 mg po BID and IV Methylprednisolone 60 mg IV q6h. -Pulmonology Dr. Juanetta GoslingHawkins following and appreciate Recc's.  -Added Chest PT and will try to continue  -His baseline functional status appears to be quite poor. -Will consider a Palliative Care Medicine Consultation  5. Hyperglycemia.  -Felt to be related to steroids.  -Hemoglobin A1c 7.6.  -On Resistant Novolog Sliding Scale Insulin and Lantus 10 units sq daily.  -CBG's have ranged from 122-183  6. Hypothyroidism. -Continue Synthroid 12.5 mcg po MWF and Levothyroxine 125 mcg po Daily   7. Hyperlipidemia.  -Continue Atorvastatin 10 mg po Daily  8. BPH -C/w Terazosin 10 mg po qHS  9. CAD -C/w ASA 325 mg po Daily and Atorvastatin 10 mg po Daily  10. Constipation, improving -Gave Bisacodyl 10 mg Suppository x1 -Added Senna-Docusate 1 tab po BID and Miralax 17 g po BID -Ordered Fleet Enema yesterday and patient had some results  11. Leukocytosis -Likely Reactive from Steroid Use; Patient's WBC was 12.7 and trended down to 11.6 -Continue to Monitor and repeat CBC in AM  DVT prophylaxis: Lovenox 40 mg sq q24h Code Status: Partial Code; No CPR, No Defibrillation  Family Communication: Discussed with Wife at Bedside Disposition Plan: Home Health PT when stable with Rollator Walker  Consultants:   Pulmonology   Procedures:  None   Antimicrobials:  Anti-infectives    Start     Dose/Rate Route Frequency Ordered Stop   05/24/16 1000  azithromycin (ZITHROMAX) tablet 500 mg  Status:  Discontinued     500 mg Oral Daily 05/23/16 1113 05/29/16  1826   05/21/16 1030  azithromycin (ZITHROMAX) 500 mg in dextrose 5 % 250 mL IVPB  Status:  Discontinued     500 mg 250 mL/hr over 60 Minutes Intravenous Every 24 hours 05/20/16 1410 05/23/16 1113   05/21/16 1000  cefTRIAXone (ROCEPHIN)  1 g in dextrose 5 % 50 mL IVPB  Status:  Discontinued     1 g 100 mL/hr over 30 Minutes Intravenous Every 24 hours 05/20/16 1410 05/29/16 1826   05/20/16 1000  cefTRIAXone (ROCEPHIN) 1 g in dextrose 5 % 50 mL IVPB     1 g 100 mL/hr over 30 Minutes Intravenous  Once 05/20/16 0945 05/20/16 1053   05/20/16 1000  azithromycin (ZITHROMAX) 500 mg in dextrose 5 % 250 mL IVPB     500 mg 250 mL/hr over 60 Minutes Intravenous  Once 05/20/16 0945 05/20/16 1201     Subjective: Seen and examined at bedside this AM and stated he was still feeling worse with his SOB. Had a small bowel movement yesterday after the enema. No Nausea or vomiting but states breathing was more difficult today. No other concerns or complaints.   Objective: Vitals:   05/29/16 0621 05/29/16 0741 05/29/16 1221 05/29/16 1448  BP: (!) 151/67   (!) 120/53  Pulse: 93   81  Resp: 17   18  Temp: 97.5 F (36.4 C)   97.5 F (36.4 C)  TempSrc: Oral   Oral  SpO2: 98% 94% 94% 97%  Weight:      Height:        Intake/Output Summary (Last 24 hours) at 05/29/16 1826 Last data filed at 05/29/16 1806  Gross per 24 hour  Intake              720 ml  Output             1375 ml  Net             -655 ml   Filed Weights   05/20/16 0914 05/20/16 1345 05/21/16 0500  Weight: 74.8 kg (165 lb) 75.2 kg (165 lb 12.6 oz) 76.7 kg (169 lb 1.5 oz)   Examination: Physical Exam:  Constitutional: NAD and appears calm and comfortable but dyspneic  Eyes:  Lids and conjunctivae normal, sclerae anicteric  ENMT: External Ears, Nose appear normal. Grossly normal hearing. Neck: Appears normal, supple, no cervical masses, normal ROM, no appreciable thyromegaly Respiratory: Diminished to auscultation bilaterally with expiratory wheezing and rhodochrous breath sounds. Increased respiratory effort . No accessory muscle use but wearing O2 via [place].  Cardiovascular: RRR, no murmurs / rubs / gallops. S1 and S2 auscultated. 1+ extremity edema especially in upper  Extremities.  Abdomen: Soft, non-tender, non-distended. No masses palpated. No appreciable hepatosplenomegaly. Bowel sounds positive x4.  GU: Deferred. Musculoskeletal: No clubbing / cyanosis of digits/nails. No joint deformity upper and lower extremities. Normal strength and muscle tone.  Skin: No rashes, lesions, ulcers on limited skin evaluation. No induration; Warm and dry.  Neurologic: CN 2-12 grossly intact with no focal deficits. Romberg sign cerebellar reflexes not assessed.  Psychiatric: Normal judgment and insight. Alert and oriented x 3. Normal mood and appropriate affect.   Data Reviewed: I have personally reviewed following labs and imaging studies  CBC:  Recent Labs Lab 05/23/16 0707 05/26/16 0444 05/27/16 0524 05/28/16 0455 05/29/16 0647  WBC 12.0* 11.0* 12.7* 12.7* 11.6*  NEUTROABS  --   --  11.9* 11.9* 10.9*  HGB 11.1* 12.2* 12.0* 11.8* 11.5*  HCT 35.5* 38.6*  38.7* 37.8* 36.8*  MCV 95.9 95.1 96.0 96.2 97.1  PLT 208 215 199 197 198   Basic Metabolic Panel:  Recent Labs Lab 05/23/16 0707 05/26/16 0444 05/27/16 0524 05/28/16 0455 05/29/16 0647  NA 138 135 139 137 137  K 3.9 4.0 4.4 4.4 4.0  CL 96* 92* 93* 93* 93*  CO2 34* 36* 36* 38* 39*  GLUCOSE 219* 204* 168* 157* 200*  BUN 27* 25* 28* 29* 28*  CREATININE 0.69 0.75 0.64 0.55* 0.57*  CALCIUM 8.8* 8.8* 8.8* 8.7* 8.8*  MG  --   --  2.0 2.0 1.9  PHOS  --   --  3.3 3.2 3.3   GFR: Estimated Creatinine Clearance: 65.9 mL/min (by C-G formula based on SCr of 0.57 mg/dL (L)). Liver Function Tests:  Recent Labs Lab 05/27/16 0524 05/28/16 0455 05/29/16 0647  AST 31 36 32  ALT 39 43 47  ALKPHOS 36* 36* 40  BILITOT 0.9 1.0 0.8  PROT 5.4* 5.3* 5.1*  ALBUMIN 2.9* 2.8* 2.9*   No results for input(s): LIPASE, AMYLASE in the last 168 hours. No results for input(s): AMMONIA in the last 168 hours. Coagulation Profile: No results for input(s): INR, PROTIME in the last 168 hours. Cardiac Enzymes: No results  for input(s): CKTOTAL, CKMB, CKMBINDEX, TROPONINI in the last 168 hours. BNP (last 3 results) No results for input(s): PROBNP in the last 8760 hours. HbA1C: No results for input(s): HGBA1C in the last 72 hours. CBG:  Recent Labs Lab 05/28/16 1638 05/28/16 2046 05/29/16 0729 05/29/16 1107 05/29/16 1628  GLUCAP 104* 232* 183* 234* 122*   Lipid Profile: No results for input(s): CHOL, HDL, LDLCALC, TRIG, CHOLHDL, LDLDIRECT in the last 72 hours. Thyroid Function Tests: No results for input(s): TSH, T4TOTAL, FREET4, T3FREE, THYROIDAB in the last 72 hours. Anemia Panel: No results for input(s): VITAMINB12, FOLATE, FERRITIN, TIBC, IRON, RETICCTPCT in the last 72 hours. Sepsis Labs: No results for input(s): PROCALCITON, LATICACIDVEN in the last 168 hours.  Recent Results (from the past 240 hour(s))  Blood Culture (routine x 2)     Status: None   Collection Time: 05/20/16 10:10 AM  Result Value Ref Range Status   Specimen Description BLOOD RIGHT FOREARM  Final   Special Requests BOTTLES DRAWN AEROBIC ONLY 6CC  Final   Culture NO GROWTH 5 DAYS  Final   Report Status 05/25/2016 FINAL  Final  Blood Culture (routine x 2)     Status: None   Collection Time: 05/20/16 10:20 AM  Result Value Ref Range Status   Specimen Description BLOOD LEFT ARM  Final   Special Requests BOTTLES DRAWN AEROBIC AND ANAEROBIC 6CC  Final   Culture NO GROWTH 5 DAYS  Final   Report Status 05/25/2016 FINAL  Final  MRSA PCR Screening     Status: None   Collection Time: 05/20/16  1:52 PM  Result Value Ref Range Status   MRSA by PCR NEGATIVE NEGATIVE Final    Comment:        The GeneXpert MRSA Assay (FDA approved for NASAL specimens only), is one component of a comprehensive MRSA colonization surveillance program. It is not intended to diagnose MRSA infection nor to guide or monitor treatment for MRSA infections.     Radiology Studies: No results found. Scheduled Meds: . acetylcysteine  3 mL  Nebulization TID  . aspirin EC  325 mg Oral Daily  . atorvastatin  10 mg Oral Daily  . bisacodyl  10 mg Rectal Once  . citalopram  20 mg Oral Daily  . enoxaparin (LOVENOX) injection  40 mg Subcutaneous Q24H  . guaiFENesin  1,200 mg Oral BID  . insulin aspart  0-20 Units Subcutaneous TID WC  . insulin aspart  0-5 Units Subcutaneous QHS  . insulin glargine  10 Units Subcutaneous Daily  . ipratropium-albuterol  3 mL Nebulization Q4H WA  . levothyroxine  12.5 mcg Oral Once per day on Mon Wed Fri  . levothyroxine  125 mcg Oral QAC breakfast  . methylPREDNISolone (SOLU-MEDROL) injection  60 mg Intravenous Q6H  . mycophenolate  1,000 mg Oral BID  . pantoprazole  40 mg Oral Daily  . polyethylene glycol  17 g Oral BID  . senna-docusate  1 tablet Oral BID  . sodium chloride  3 mL Nebulization Daily  . terazosin  10 mg Oral QHS   Continuous Infusions: . sodium chloride      LOS: 9 days   Merlene Laughter, DO [facility] Pager [phone]  If 7PM-7AM, please contact night-coverage [email or web address] Password [facility] 05/29/2016, 6:26 PM

## 2016-05-30 ENCOUNTER — Inpatient Hospital Stay (HOSPITAL_COMMUNITY): Payer: Medicare Other

## 2016-05-30 DIAGNOSIS — J9621 Acute and chronic respiratory failure with hypoxia: Secondary | ICD-10-CM

## 2016-05-30 LAB — CBC WITH DIFFERENTIAL/PLATELET
BASOS ABS: 0 10*3/uL (ref 0.0–0.1)
BASOS PCT: 0 %
EOS ABS: 0 10*3/uL (ref 0.0–0.7)
Eosinophils Relative: 0 %
HEMATOCRIT: 38.4 % — AB (ref 39.0–52.0)
Hemoglobin: 12 g/dL — ABNORMAL LOW (ref 13.0–17.0)
Lymphocytes Relative: 2 %
Lymphs Abs: 0.2 10*3/uL — ABNORMAL LOW (ref 0.7–4.0)
MCH: 30.3 pg (ref 26.0–34.0)
MCHC: 31.3 g/dL (ref 30.0–36.0)
MCV: 97 fL (ref 78.0–100.0)
MONOS PCT: 3 %
Monocytes Absolute: 0.5 10*3/uL (ref 0.1–1.0)
NEUTROS ABS: 14.2 10*3/uL — AB (ref 1.7–7.7)
NEUTROS PCT: 95 %
Platelets: 182 10*3/uL (ref 150–400)
RBC: 3.96 MIL/uL — ABNORMAL LOW (ref 4.22–5.81)
RDW: 13.3 % (ref 11.5–15.5)
WBC: 14.9 10*3/uL — ABNORMAL HIGH (ref 4.0–10.5)

## 2016-05-30 LAB — GLUCOSE, CAPILLARY
GLUCOSE-CAPILLARY: 197 mg/dL — AB (ref 65–99)
GLUCOSE-CAPILLARY: 314 mg/dL — AB (ref 65–99)
Glucose-Capillary: 164 mg/dL — ABNORMAL HIGH (ref 65–99)
Glucose-Capillary: 210 mg/dL — ABNORMAL HIGH (ref 65–99)

## 2016-05-30 LAB — COMPREHENSIVE METABOLIC PANEL
ALBUMIN: 3.1 g/dL — AB (ref 3.5–5.0)
ALK PHOS: 43 U/L (ref 38–126)
ALT: 50 U/L (ref 17–63)
ANION GAP: 8 (ref 5–15)
AST: 37 U/L (ref 15–41)
BUN: 30 mg/dL — AB (ref 6–20)
CO2: 42 mmol/L — AB (ref 22–32)
Calcium: 9 mg/dL (ref 8.9–10.3)
Chloride: 91 mmol/L — ABNORMAL LOW (ref 101–111)
Creatinine, Ser: 0.6 mg/dL — ABNORMAL LOW (ref 0.61–1.24)
GFR calc Af Amer: >60 mL/min (ref >60)
GFR calc non Af Amer: >60 mL/min (ref >60)
GLUCOSE: 198 mg/dL — AB (ref 65–99)
Potassium: 4.1 mmol/L (ref 3.5–5.1)
SODIUM: 141 mmol/L (ref 135–145)
Total Bilirubin: 0.8 mg/dL (ref 0.3–1.2)
Total Protein: 5.4 g/dL — ABNORMAL LOW (ref 6.5–8.1)

## 2016-05-30 LAB — MAGNESIUM: Magnesium: 1.8 mg/dL (ref 1.7–2.4)

## 2016-05-30 LAB — PHOSPHORUS: Phosphorus: 3.3 mg/dL (ref 2.5–4.6)

## 2016-05-30 MED ORDER — BISACODYL 10 MG RE SUPP
10.0000 mg | Freq: Once | RECTAL | Status: DC
  Filled 2016-05-30: qty 1

## 2016-05-30 MED ORDER — FUROSEMIDE 10 MG/ML IJ SOLN
40.0000 mg | Freq: Once | INTRAMUSCULAR | Status: AC
  Administered 2016-05-30: 40 mg via INTRAVENOUS
  Filled 2016-05-30: qty 4

## 2016-05-30 MED ORDER — ACETYLCYSTEINE 20 % IN SOLN
3.0000 mL | Freq: Three times a day (TID) | RESPIRATORY_TRACT | Status: DC
  Administered 2016-05-30: 16:00:00 via RESPIRATORY_TRACT
  Administered 2016-05-30: 4 mL via RESPIRATORY_TRACT
  Administered 2016-05-31 – 2016-06-03 (×11): 3 mL via RESPIRATORY_TRACT
  Filled 2016-05-30 (×13): qty 4

## 2016-05-30 NOTE — Progress Notes (Addendum)
PROGRESS NOTE    Raymond [name]  WUJ:[id number] DOB: Sep 16, 1929 DOA: 05/20/2016 PCP: Rudi Heap, MD   Brief Narrative:  83 year old male with a history of chronic respiratory failure on 4 L of oxygen, COPD, interstitial lung disease/pulmonary fibrosis, came to the hospital with worsening shortness of breath and found to have possible pneumonia, COPD exacerbation. Started on IV steroids, antibiotics and bronchodilators. Slowly improving and not able to expectorate much sputum. Pulmonology following and appreciate recc's. Still remains very SOB and dyspneic. Legs were swollen so will check ECHO and BNP to r/o Heart Failure.   Assessment & Plan:   Active Problems:   Hypothyroidism   Hyperlipidemia, acquired   PULMONARY FIBROSIS ILD POST INFLAMMATORY CHRONIC   CAP (community acquired pneumonia)   Pulmonary fibrosis (HCC)   Hyperglycemia, drug-induced   COPD with exacerbation (HCC)   Acute on chronic respiratory failure (HCC)  1. Acute on chronic respiratory failure with Hypoxia.  -Suspect this is related to pneumonia as well as COPD/pulmonary fibrosis.  -Currently on slightly higher than baseline oxygen requirement of 4 L.  -Patient likely has a component of dysphagia. Speech therapy following the patient underwent modified barium swallow. He was found to have a small Zenker's diverticulum. Does not appear to be a good candidate for repair. He is continued on a dysphagia 3 diet.. -Added Sodium Chloride 0.9% Neb Solution 3 mL Daily -C/w Chest Physiotherapy -Gave another dose of Lasix IV 40 mg today; s/p 2 doses of IV Lasix 40 mg now -C/w Flutter Valve -Added Incentive Spirometry  2. Probable Community-Acquired Pneumonia.  -S/p Treatment with Ceftriaxone and Azithromycin 10 day treatment  -Influenza panel negative.  3. COPD Exacerbation.  -Continue on IV Methylprednisolone 60 mg IV q6h, Abx with po Azithromycin 500 mg po Daily and Ceftriaxone 1 gram q24h and Bronchodilators;  C/w IH DuoNebs, Albuterol, and Mucomyst.  -Continue pulmonary hygiene and Chest PT -Added 0.9% Nebulizer Solution Daily -Repeated IV Lasix today  4. Interstitial lung Disease/Pulmonary Fibrosis.  -Continue Mycophenolate 1000 mg po BID and IV Methylprednisolone 60 mg IV q6h. -Pulmonology Dr. Juanetta Gosling following and appreciate Recc's.  -Added Chest PT and will try to continue q4h -His baseline functional status appears to be quite poor. -CXR on 05/27/16 showed Underlying chronic interstitial lund disease -Will consider a Palliative Care Medicine Consultation in AM as patient not progressing significantly   5. Left Upper Arm Edema and Swelling -Hand significantly swollen; Likely dependent edema -Gave another dose of IV Lasix 40 mg  -Ultrasound of Upper Extremity showed no evidence of Left Upper Extremity DVT -Advised to prop arm up and OOB and Ambulate  6. Volume Overload ? Acute Diastolic CHF -CXR on 05/27/2016 showed Cardiomegaly with progressive bilateral from interstitial prominence noted consistent CHF. Pneumonitis cannot be excluded. Underlying chronic interstitial lung disease. Contrast in the colon. No acute bony abnormality . -Gave 2 Doses of IV Lasix -Will Obtain Complete Echocardiogram -Last ECHO showed EF of 55-60% with No Regional Wall Motion Abnormalities and showed borderline Myocardial Hypertrophy with Mild Disproportionate focal Basal Septal Hypertrophy -Daily Weights, Strict I's and O's -Check BNP -Repeat CXR in AM  7. Hyperglycemia.  -Felt to be related to Steroids.  -Hemoglobin A1c 7.6.  -On Resistant Novolog Sliding Scale Insulin and Lantus 10 units sq daily.  -CBG's have ranged from 197-314 -Will Consult Diabetes Education Coordinator for assistance  8. Hypothyroidism. -TSH on 04/30/16 was 1.720 -Continue Synthroid 12.5 mcg po MWF and Levothyroxine 125 mcg po Daily   9. Hyperlipidemia.  -  Continue Atorvastatin 10 mg po Daily  10. BPH -C/w Terazosin 10 mg po  qHS  11. CAD -C/w ASA 325 mg po Daily and Atorvastatin 10 mg po Daily  12. Constipation, improving -Gave another Bisacodyl 10 mg Suppository x1 today -Added Senna-Docusate 1 tab po BID and Miralax 17 g po BID -If still not improving tomorrow will order another Enema  13. Leukocytosis -Likely Reactive from Steroid Use; Patient's WBC now 14.9 -Continue to Monitor and repeat CBC in AM  DVT prophylaxis: Lovenox 40 mg sq q24h Code Status: Partial Code; No CPR, No Defibrillation  Family Communication: Discussed with Wife at Bedside Disposition Plan: Home Health PT when stable with Rollator Walker  Consultants:   Pulmonology   Procedures:  Left Upper Arm Venous Ultrasound - No evidence of left upper extremity deep venous thrombosis.   Antimicrobials:  Anti-infectives    Start     Dose/Rate Route Frequency Ordered Stop   05/24/16 1000  azithromycin (ZITHROMAX) tablet 500 mg  Status:  Discontinued     500 mg Oral Daily 05/23/16 1113 05/29/16 1826   05/21/16 1030  azithromycin (ZITHROMAX) 500 mg in dextrose 5 % 250 mL IVPB  Status:  Discontinued     500 mg 250 mL/hr over 60 Minutes Intravenous Every 24 hours 05/20/16 1410 05/23/16 1113   05/21/16 1000  cefTRIAXone (ROCEPHIN) 1 g in dextrose 5 % 50 mL IVPB  Status:  Discontinued     1 g 100 mL/hr over 30 Minutes Intravenous Every 24 hours 05/20/16 1410 05/29/16 1826   05/20/16 1000  cefTRIAXone (ROCEPHIN) 1 g in dextrose 5 % 50 mL IVPB     1 g 100 mL/hr over 30 Minutes Intravenous  Once 05/20/16 0945 05/20/16 1053   05/20/16 1000  azithromycin (ZITHROMAX) 500 mg in dextrose 5 % 250 mL IVPB     500 mg 250 mL/hr over 60 Minutes Intravenous  Once 05/20/16 0945 05/20/16 1201     Subjective: Seen and examined at bedside this AM and stated he still was not feeling good and was SOB. Wife was helping him sponge down this AM and stated that his Left Hand was significantly swollen but not as much anymore. No Nausea or vomiting and states  that he did not have a bowel movement yesterday. No other complaints or concerns but still dyspneic.   Objective: Vitals:   05/29/16 2341 05/30/16 0610 05/30/16 0800 05/30/16 1226  BP:  (!) 146/74    Pulse:  84    Resp:  (!) 21    Temp:  98 F (36.7 C)    TempSrc:  Oral    SpO2: 94% 98% 98% 91%  Weight:      Height:        Intake/Output Summary (Last 24 hours) at 05/30/16 1400 Last data filed at 05/30/16 1300  Gross per 24 hour  Intake              720 ml  Output             1275 ml  Net             -555 ml   Filed Weights   05/20/16 0914 05/20/16 1345 05/21/16 0500  Weight: 74.8 kg (165 lb) 75.2 kg (165 lb 12.6 oz) 76.7 kg (169 lb 1.5 oz)   Examination: Physical Exam:  Constitutional: NAD and appears calm and comfortable sitting at edge of bed but dyspneic  Eyes:  Lids and conjunctivae normal, sclerae anicteric  ENMT:  External Ears, Nose appear normal. Grossly normal hearing. Neck: Appears normal, supple, no cervical masses, normal ROM, no appreciable thyromegaly Respiratory: Diminished to auscultation bilaterally with expiratory wheezing and rhodochrous breath sounds. Slightly Increased respiratory effort . No accessory muscle use but wearing O2 via [place].  Cardiovascular: RRR, no murmurs / rubs / gallops. S1 and S2 auscultated. 1+ extremity edema especially in upper Extremities and Hands significantly swollen with L >R. 1+ Lower extremity swelling.  Abdomen: Soft, non-tender, non-distended. No masses palpated. No appreciable hepatosplenomegaly. Bowel sounds positive x4.  GU: Deferred. Musculoskeletal: No clubbing / cyanosis of digits/nails. No joint deformity upper and lower extremities. Normal strength and muscle tone.  Skin: No rashes, lesions, ulcers on limited skin evaluation. No induration; Warm and dry.  Neurologic: CN 2-12 grossly intact with no focal deficits. Romberg sign cerebellar reflexes not assessed.  Psychiatric: Normal judgment and insight. Alert and oriented  x 3. Normal mood and appropriate affect.   Data Reviewed: I have personally reviewed following labs and imaging studies  CBC:  Recent Labs Lab 05/26/16 0444 05/27/16 0524 05/28/16 0455 05/29/16 0647 05/30/16 0609  WBC 11.0* 12.7* 12.7* 11.6* 14.9*  NEUTROABS  --  11.9* 11.9* 10.9* 14.2*  HGB 12.2* 12.0* 11.8* 11.5* 12.0*  HCT 38.6* 38.7* 37.8* 36.8* 38.4*  MCV 95.1 96.0 96.2 97.1 97.0  PLT 215 199 197 198 182   Basic Metabolic Panel:  Recent Labs Lab 05/26/16 0444 05/27/16 0524 05/28/16 0455 05/29/16 0647 05/30/16 0609  NA 135 139 137 137 141  K 4.0 4.4 4.4 4.0 4.1  CL 92* 93* 93* 93* 91*  CO2 36* 36* 38* 39* 42*  GLUCOSE 204* 168* 157* 200* 198*  BUN 25* 28* 29* 28* 30*  CREATININE 0.75 0.64 0.55* 0.57* 0.60*  CALCIUM 8.8* 8.8* 8.7* 8.8* 9.0  MG  --  2.0 2.0 1.9 1.8  PHOS  --  3.3 3.2 3.3 3.3   GFR: Estimated Creatinine Clearance: 65.9 mL/min (by C-G formula based on SCr of 0.6 mg/dL (L)). Liver Function Tests:  Recent Labs Lab 05/27/16 0524 05/28/16 0455 05/29/16 0647 05/30/16 0609  AST 31 36 32 37  ALT 39 43 47 50  ALKPHOS 36* 36* 40 43  BILITOT 0.9 1.0 0.8 0.8  PROT 5.4* 5.3* 5.1* 5.4*  ALBUMIN 2.9* 2.8* 2.9* 3.1*   No results for input(s): LIPASE, AMYLASE in the last 168 hours. No results for input(s): AMMONIA in the last 168 hours. Coagulation Profile: No results for input(s): INR, PROTIME in the last 168 hours. Cardiac Enzymes: No results for input(s): CKTOTAL, CKMB, CKMBINDEX, TROPONINI in the last 168 hours. BNP (last 3 results) No results for input(s): PROBNP in the last 8760 hours. HbA1C: No results for input(s): HGBA1C in the last 72 hours. CBG:  Recent Labs Lab 05/29/16 1107 05/29/16 1628 05/29/16 2102 05/30/16 0751 05/30/16 1109  GLUCAP 234* 122* 267* 197* 314*   Lipid Profile: No results for input(s): CHOL, HDL, LDLCALC, TRIG, CHOLHDL, LDLDIRECT in the last 72 hours. Thyroid Function Tests: No results for input(s): TSH,  T4TOTAL, FREET4, T3FREE, THYROIDAB in the last 72 hours. Anemia Panel: No results for input(s): VITAMINB12, FOLATE, FERRITIN, TIBC, IRON, RETICCTPCT in the last 72 hours. Sepsis Labs: No results for input(s): PROCALCITON, LATICACIDVEN in the last 168 hours.  No results found for this or any previous visit (from the past 240 hour(s)).  Radiology Studies: [place] Venous Img Upper Uni Left  Result Date: 05/30/2016 CLINICAL DATA:  Left upper extremity edema, pain and erythema.  EXAM: LEFT UPPER EXTREMITY VENOUS DOPPLER ULTRASOUND TECHNIQUE: Gray-scale sonography with graded compression, as well as color Doppler and duplex ultrasound were performed to evaluate the upper extremity deep venous system from the level of the subclavian vein and including the jugular, axillary, basilic, radial, ulnar and upper cephalic vein. Spectral Doppler was utilized to evaluate flow at rest and with distal augmentation maneuvers. COMPARISON:  None. FINDINGS: Contralateral Subclavian Vein: Respiratory phasicity is normal and symmetric with the symptomatic side. No evidence of thrombus. Normal compressibility. Internal Jugular Vein: No evidence of thrombus. Normal compressibility, respiratory phasicity and response to augmentation. Subclavian Vein: No evidence of thrombus. Normal compressibility, respiratory phasicity and response to augmentation. Axillary Vein: No evidence of thrombus. Normal compressibility, respiratory phasicity and response to augmentation. Cephalic Vein: No evidence of thrombus. Normal compressibility, respiratory phasicity and response to augmentation. Basilic Vein: No evidence of thrombus. Normal compressibility, respiratory phasicity and response to augmentation. Brachial Veins: No evidence of thrombus. Normal compressibility, respiratory phasicity and response to augmentation. Radial Veins: No evidence of thrombus. Normal compressibility, respiratory phasicity and response to augmentation. Ulnar Veins: No  evidence of thrombus. Normal compressibility, respiratory phasicity and response to augmentation. Venous Reflux:  None visualized. Other Findings: No evidence of superficial thrombophlebitis or abnormal fluid collection. IMPRESSION: No evidence of left upper extremity deep venous thrombosis. Electronically Signed   By: Irish Lack M.D.   On: 05/30/2016 13:56   Scheduled Meds: . acetylcysteine  3 mL Nebulization TID  . aspirin EC  325 mg Oral Daily  . atorvastatin  10 mg Oral Daily  . bisacodyl  10 mg Rectal Once  . bisacodyl  10 mg Rectal Once  . citalopram  20 mg Oral Daily  . enoxaparin (LOVENOX) injection  40 mg Subcutaneous Q24H  . guaiFENesin  1,200 mg Oral BID  . insulin aspart  0-20 Units Subcutaneous TID WC  . insulin aspart  0-5 Units Subcutaneous QHS  . insulin glargine  10 Units Subcutaneous Daily  . ipratropium-albuterol  3 mL Nebulization Q4H WA  . levothyroxine  12.5 mcg Oral Once per day on Mon Wed Fri  . levothyroxine  125 mcg Oral QAC breakfast  . methylPREDNISolone (SOLU-MEDROL) injection  60 mg Intravenous Q6H  . mycophenolate  1,000 mg Oral BID  . pantoprazole  40 mg Oral Daily  . polyethylene glycol  17 g Oral BID  . senna-docusate  1 tablet Oral BID  . sodium chloride  3 mL Nebulization Daily  . terazosin  10 mg Oral QHS   Continuous Infusions: . sodium chloride      LOS: 10 days   Merlene Laughter, DO [facility] Pager [phone]  If 7PM-7AM, please contact night-coverage [email or web address] Password [email or web address] 05/30/2016, 2:00 PM

## 2016-05-30 NOTE — Progress Notes (Signed)
Pt left hand has increased in swelling and redness from earlier assessment. Pt denies pain to area, has capillary refill > 3 seconds. Dr. Conley RollsLe in to evaluate pt will continue to monitor.

## 2016-05-30 NOTE — Progress Notes (Signed)
CTSP re left hand swelling. Good pulses, and adequate capillary refills. No arterial compromise. Non tender. Will obtain US of the left arm to exclude a potential DVT.  Not high clinical suspicious.  Houston SirenPeter Antonetta Clanton MD FACP. Hospitalist.

## 2016-05-30 NOTE — Progress Notes (Signed)
Patient has been taking Mucomyst for over 72 hours will need to be renewed to continue.

## 2016-05-31 ENCOUNTER — Inpatient Hospital Stay (HOSPITAL_COMMUNITY): Payer: Medicare Other

## 2016-05-31 DIAGNOSIS — R0600 Dyspnea, unspecified: Secondary | ICD-10-CM

## 2016-05-31 LAB — COMPREHENSIVE METABOLIC PANEL WITH GFR
ALT: 49 U/L (ref 17–63)
AST: 37 U/L (ref 15–41)
Albumin: 2.9 g/dL — ABNORMAL LOW (ref 3.5–5.0)
Alkaline Phosphatase: 41 U/L (ref 38–126)
Anion gap: 7 (ref 5–15)
BUN: 34 mg/dL — ABNORMAL HIGH (ref 6–20)
CO2: 44 mmol/L — ABNORMAL HIGH (ref 22–32)
Calcium: 9.2 mg/dL (ref 8.9–10.3)
Chloride: 90 mmol/L — ABNORMAL LOW (ref 101–111)
Creatinine, Ser: 0.64 mg/dL (ref 0.61–1.24)
GFR calc Af Amer: >60 mL/min
GFR calc non Af Amer: >60 mL/min
Glucose, Bld: 177 mg/dL — ABNORMAL HIGH (ref 65–99)
Potassium: 4 mmol/L (ref 3.5–5.1)
Sodium: 141 mmol/L (ref 135–145)
Total Bilirubin: 0.7 mg/dL (ref 0.3–1.2)
Total Protein: 5.3 g/dL — ABNORMAL LOW (ref 6.5–8.1)

## 2016-05-31 LAB — ECHOCARDIOGRAM COMPLETE
Height: 66 in
Weight: 2662.4 oz

## 2016-05-31 LAB — CBC WITH DIFFERENTIAL/PLATELET
Basophils Absolute: 0 10*3/uL (ref 0.0–0.1)
Basophils Relative: 0 %
Eosinophils Absolute: 0 10*3/uL (ref 0.0–0.7)
Eosinophils Relative: 0 %
HCT: 38.1 % — ABNORMAL LOW (ref 39.0–52.0)
Hemoglobin: 11.9 g/dL — ABNORMAL LOW (ref 13.0–17.0)
LYMPHS ABS: 0.3 10*3/uL — AB (ref 0.7–4.0)
LYMPHS PCT: 2 %
MCH: 30.4 pg (ref 26.0–34.0)
MCHC: 31.2 g/dL (ref 30.0–36.0)
MCV: 97.2 fL (ref 78.0–100.0)
Monocytes Absolute: 0.5 10*3/uL (ref 0.1–1.0)
Monocytes Relative: 4 %
NEUTROS PCT: 94 %
Neutro Abs: 13 10*3/uL — ABNORMAL HIGH (ref 1.7–7.7)
PLATELETS: 185 10*3/uL (ref 150–400)
RBC: 3.92 MIL/uL — AB (ref 4.22–5.81)
RDW: 13.4 % (ref 11.5–15.5)
WBC: 13.8 10*3/uL — AB (ref 4.0–10.5)

## 2016-05-31 LAB — GLUCOSE, CAPILLARY
GLUCOSE-CAPILLARY: 233 mg/dL — AB (ref 65–99)
Glucose-Capillary: 157 mg/dL — ABNORMAL HIGH (ref 65–99)
Glucose-Capillary: 342 mg/dL — ABNORMAL HIGH (ref 65–99)
Glucose-Capillary: 131 mg/dL — ABNORMAL HIGH (ref 65–99)

## 2016-05-31 LAB — HEPARIN LEVEL (UNFRACTIONATED): HEPARIN UNFRACTIONATED: 0.77 [IU]/mL — AB (ref 0.30–0.70)

## 2016-05-31 LAB — BRAIN NATRIURETIC PEPTIDE: B Natriuretic Peptide: 67 pg/mL (ref 0.0–100.0)

## 2016-05-31 LAB — PROTIME-INR
INR: 0.93
Prothrombin Time: 12.5 seconds (ref 11.4–15.2)

## 2016-05-31 LAB — MAGNESIUM: Magnesium: 1.9 mg/dL (ref 1.7–2.4)

## 2016-05-31 LAB — APTT: aPTT: 24 seconds (ref 24–36)

## 2016-05-31 LAB — PHOSPHORUS: Phosphorus: 3.2 mg/dL (ref 2.5–4.6)

## 2016-05-31 MED ORDER — IPRATROPIUM BROMIDE 0.02 % IN SOLN
0.5000 mg | RESPIRATORY_TRACT | Status: DC
  Administered 2016-05-31 – 2016-06-03 (×18): 0.5 mg via RESPIRATORY_TRACT
  Filled 2016-05-31 (×18): qty 2.5

## 2016-05-31 MED ORDER — HEPARIN BOLUS VIA INFUSION
4000.0000 [IU] | Freq: Once | INTRAVENOUS | Status: AC
  Administered 2016-05-31: 4000 [IU] via INTRAVENOUS
  Filled 2016-05-31: qty 4000

## 2016-05-31 MED ORDER — LEVALBUTEROL HCL 1.25 MG/0.5ML IN NEBU
1.2500 mg | INHALATION_SOLUTION | RESPIRATORY_TRACT | Status: DC
  Administered 2016-05-31 – 2016-06-03 (×18): 1.25 mg via RESPIRATORY_TRACT
  Filled 2016-05-31 (×17): qty 0.5

## 2016-05-31 MED ORDER — HEPARIN (PORCINE) IN NACL 100-0.45 UNIT/ML-% IJ SOLN
850.0000 [IU]/h | INTRAMUSCULAR | Status: DC
  Administered 2016-05-31: 900 [IU]/h via INTRAVENOUS
  Filled 2016-05-31: qty 250

## 2016-05-31 MED ORDER — FUROSEMIDE 10 MG/ML IJ SOLN
40.0000 mg | Freq: Once | INTRAMUSCULAR | Status: DC
  Filled 2016-05-31: qty 4

## 2016-05-31 MED ORDER — INSULIN ASPART 100 UNIT/ML ~~LOC~~ SOLN
4.0000 [IU] | Freq: Three times a day (TID) | SUBCUTANEOUS | Status: DC
  Administered 2016-05-31 – 2016-06-01 (×3): 4 [IU] via SUBCUTANEOUS

## 2016-05-31 MED ORDER — DILTIAZEM HCL 30 MG PO TABS
30.0000 mg | ORAL_TABLET | Freq: Three times a day (TID) | ORAL | Status: DC
  Administered 2016-05-31 – 2016-06-02 (×6): 30 mg via ORAL
  Filled 2016-05-31 (×6): qty 1

## 2016-05-31 MED ORDER — IOPAMIDOL (ISOVUE-370) INJECTION 76%
100.0000 mL | Freq: Once | INTRAVENOUS | Status: AC | PRN
  Administered 2016-05-31: 100 mL via INTRAVENOUS

## 2016-05-31 NOTE — Progress Notes (Signed)
*  PRELIMINARY RESULTS* Echocardiogram 2D Echocardiogram has been performed.  Stacey DrainWhite, Nai Borromeo J 05/31/2016, 10:08 AM

## 2016-05-31 NOTE — Progress Notes (Signed)
Pt returned from CT angio

## 2016-05-31 NOTE — Progress Notes (Signed)
Pt being transported for a CT Angio via transport team.

## 2016-05-31 NOTE — Progress Notes (Signed)
Inpatient Diabetes Program Recommendations  AACE/ADA: New Consensus Statement on Inpatient Glycemic Control (2015)  Target Ranges:  Prepandial:   less than 140 mg/dL      Peak postprandial:   less than 180 mg/dL (1-2 hours)      Critically ill patients:  140 - 180 mg/dL  Results for Raymond SkainsGLIDEWELL, [place] H (MRN [id number]) as of 05/31/2016 07:39  Ref. Range 05/30/2016 07:51 05/30/2016 11:09 05/30/2016 16:05 05/30/2016 21:10 05/31/2016 07:32  Glucose-Capillary Latest Ref Range: 65 - 99 mg/dL 409197 (H) 811314 (H) 914164 (H) 210 (H) 157 (H)    Review of Glycemic Control   Current orders for Inpatient glycemic control: Novolog 0-20 units TID with meals, Novolog 0-5 units QHS, Lantus 10 units daily  Inpatient Diabetes Program Recommendations: Insulin - Meal Coverage: Post prandial glucose is consistently elevated with steroids. If steroids are continued, please consider ordering Novolog 4 units TID with meals for meal coverage if patient eats at least 50% of meal.  Thanks, Orlando PennerMarie Wrenley Sayed, RN, MSN, CDE Diabetes Coordinator Inpatient Diabetes Program [phone] (Team Pager from 8am to 5pm)

## 2016-05-31 NOTE — Progress Notes (Signed)
Pt being transported down to procedural area to get CT angio performed.

## 2016-05-31 NOTE — Progress Notes (Signed)
Pt complaining of feeling like they were going to pass out. Vitals taken, all WNL except BP was sig. Lower than previous readings. MD notified.  Will continue to monitor.

## 2016-05-31 NOTE — Progress Notes (Signed)
Pt being transported back from CT as test wasn't able to be performed due to an inadequate IV site. Multiple attempts tried down there, will attempt myself and if unsuccessful will get another RN to try.

## 2016-05-31 NOTE — Progress Notes (Signed)
ANTICOAGULATION CONSULT NOTE - Initial Consult  Pharmacy Consult for Heparin Indication: atrial fibrillation  Allergies  Allergen Reactions  . Codeine Other (See Comments)    hallucinations  . Levaquin [Levofloxacin In D5w] Other (See Comments)    Tendonitis    Patient Measurements: Height: 5\' 6"  (167.6 cm) Weight: 166 lb 6.4 oz (75.5 kg) IBW/kg (Calculated) : 63.8 HEPARIN DW (KG): 75.2  Vital Signs: Temp: 97.7 F (36.5 C) (03/05 1019) Temp Source: Oral (03/05 1019) BP: 108/55 (03/05 1319) Pulse Rate: 126 (03/05 1319)  Labs:  Recent Labs  05/29/16 0647 05/30/16 0609 05/31/16 0606  HGB 11.5* 12.0* 11.9*  HCT 36.8* 38.4* 38.1*  PLT 198 182 185  CREATININE 0.57* 0.60* 0.64    Estimated Creatinine Clearance: 60.9 mL/min (by C-G formula based on SCr of 0.64 mg/dL).   Medical History: Past Medical History:  Diagnosis Date  . Achilles tendon rupture   . Arthritis   . BPH (benign prostatic hyperplasia)   . CAD (coronary artery disease)   . Cataracts, bilateral   . COPD (chronic obstructive pulmonary disease) (HCC)   . GERD (gastroesophageal reflux disease)   . Hyperlipemia   . Hypothyroid   . ILD (interstitial lung disease) (HCC)   . Prostate hyperplasia, benign localized, with urinary obstruction     Medications:  Prescriptions Prior to Admission  Medication Sig Dispense Refill Last Dose  . albuterol (PROVENTIL) (2.5 MG/3ML) 0.083% nebulizer solution Take 3 mLs (2.5 mg total) by nebulization every 6 (six) hours as needed for wheezing or shortness of breath. 75 mL 12 05/19/2016 at Unknown time  . amoxicillin-clavulanate (AUGMENTIN) 875-125 MG tablet Take 1 tablet by mouth 2 (two) times daily. 60 tablet 1 05/19/2016 at Unknown time  . aspirin EC 325 MG tablet Take 325 mg by mouth daily.   05/20/2016 at Unknown time  . atorvastatin (LIPITOR) 10 MG tablet Take 1 tablet (10 mg total) by mouth daily. 90 tablet 3 05/19/2016 at Unknown time  . Cholecalciferol (VITAMIN D)  2000 UNITS CAPS Take 2,000-4,000 Units by mouth daily. Takes 2000 units everyday except for Fri, Sat, and Sun patient takes 4000 units.   05/20/2016 at Unknown time  . citalopram (CELEXA) 20 MG tablet Take 1 tablet (20 mg total) by mouth daily. 90 tablet 3 05/20/2016 at Unknown time  . levothyroxine (SYNTHROID, LEVOTHROID) 125 MCG tablet Take 1 tablet (125 mcg total) by mouth daily.   05/20/2016 at Unknown time  . levothyroxine (SYNTHROID, LEVOTHROID) 25 MCG tablet Take 12.5 mcg by mouth. Take Monday, Wednesday and Friday with the 125mcg   05/20/2016 at Unknown time  . mycophenolate (CELLCEPT) 500 MG tablet Take 2 tablets by mouth 2 (two) times daily.   05/20/2016 at Unknown time  . omeprazole (PRILOSEC) 20 MG capsule Take 1 capsule (20 mg total) by mouth every evening. 90 capsule 3 05/19/2016 at Unknown time  . predniSONE (DELTASONE) 10 MG tablet Take 15 mg by mouth.    05/19/2016 at Unknown time  . terazosin (HYTRIN) 10 MG capsule Take 1 capsule (10 mg total) by mouth at bedtime. 90 capsule 3 05/19/2016 at Unknown time    Assessment: 83 yo male with new onset atrial fibrillation. Admitted for acute on chronic respiratory failure with hypoxia. Pharmacy to start heparin for anticoagulation.  Goal of Therapy:  Heparin level 0.3-0.7 units/ml  Monitor platelets by anticoagulation protocol: Yes   Plan:  Give 4000 units bolus x 1 Start heparin infusion at 900 units/hr Check anti-Xa level in 8 hours  and daily while on heparin Continue to monitor H&H and platelets  Elder Cyphers, BS Loura Back, BCPS Clinical Pharmacist Pager [phone] 05/31/2016,1:47 PM

## 2016-05-31 NOTE — Progress Notes (Signed)
ANTICOAGULATION CONSULT NOTE  Pharmacy Consult for Heparin Indication: atrial fibrillation  Allergies  Allergen Reactions  . Codeine Other (See Comments)    hallucinations  . Levaquin [Levofloxacin In D5w] Other (See Comments)    Tendonitis    Patient Measurements: Height: 5\' 6"  (167.6 cm) Weight: 166 lb 8 oz (75.5 kg) IBW/kg (Calculated) : 63.8 HEPARIN DW (KG): 75.2  Vital Signs: Temp: 97.8 F (36.6 C) (03/05 2013) Temp Source: Oral (03/05 2013) BP: 122/58 (03/05 2013) Pulse Rate: 87 (03/05 2056)  Labs:  Recent Labs  05/29/16 0647 05/30/16 0609 05/31/16 0606 05/31/16 1406 05/31/16 2138  HGB 11.5* 12.0* 11.9*  --   --   HCT 36.8* 38.4* 38.1*  --   --   PLT 198 182 185  --   --   APTT  --   --   --  24  --   LABPROT  --   --   --  12.5  --   INR  --   --   --  0.93  --   HEPARINUNFRC  --   --   --   --  0.77*  CREATININE 0.57* 0.60* 0.64  --   --     Estimated Creatinine Clearance: 60.9 mL/min (by C-G formula based on SCr of 0.64 mg/dL).   Medical History: Past Medical History:  Diagnosis Date  . Achilles tendon rupture   . Arthritis   . BPH (benign prostatic hyperplasia)   . CAD (coronary artery disease)   . Cataracts, bilateral   . COPD (chronic obstructive pulmonary disease) (HCC)   . GERD (gastroesophageal reflux disease)   . Hyperlipemia   . Hypothyroid   . ILD (interstitial lung disease) (HCC)   . Prostate hyperplasia, benign localized, with urinary obstruction     Medications:  Prescriptions Prior to Admission  Medication Sig Dispense Refill Last Dose  . albuterol (PROVENTIL) (2.5 MG/3ML) 0.083% nebulizer solution Take 3 mLs (2.5 mg total) by nebulization every 6 (six) hours as needed for wheezing or shortness of breath. 75 mL 12 05/19/2016 at Unknown time  . amoxicillin-clavulanate (AUGMENTIN) 875-125 MG tablet Take 1 tablet by mouth 2 (two) times daily. 60 tablet 1 05/19/2016 at Unknown time  . aspirin EC 325 MG tablet Take 325 mg by mouth  daily.   05/20/2016 at Unknown time  . atorvastatin (LIPITOR) 10 MG tablet Take 1 tablet (10 mg total) by mouth daily. 90 tablet 3 05/19/2016 at Unknown time  . Cholecalciferol (VITAMIN D) 2000 UNITS CAPS Take 2,000-4,000 Units by mouth daily. Takes 2000 units everyday except for Fri, Sat, and Sun patient takes 4000 units.   05/20/2016 at Unknown time  . citalopram (CELEXA) 20 MG tablet Take 1 tablet (20 mg total) by mouth daily. 90 tablet 3 05/20/2016 at Unknown time  . levothyroxine (SYNTHROID, LEVOTHROID) 125 MCG tablet Take 1 tablet (125 mcg total) by mouth daily.   05/20/2016 at Unknown time  . levothyroxine (SYNTHROID, LEVOTHROID) 25 MCG tablet Take 12.5 mcg by mouth. Take Monday, Wednesday and Friday with the 125mcg   05/20/2016 at Unknown time  . mycophenolate (CELLCEPT) 500 MG tablet Take 2 tablets by mouth 2 (two) times daily.   05/20/2016 at Unknown time  . omeprazole (PRILOSEC) 20 MG capsule Take 1 capsule (20 mg total) by mouth every evening. 90 capsule 3 05/19/2016 at Unknown time  . predniSONE (DELTASONE) 10 MG tablet Take 15 mg by mouth.    05/19/2016 at Unknown time  . terazosin (HYTRIN)  10 MG capsule Take 1 capsule (10 mg total) by mouth at bedtime. 90 capsule 3 05/19/2016 at Unknown time    Assessment: 83 yo male with new onset atrial fibrillation. Admitted for acute on chronic respiratory failure with hypoxia. Pharmacy to start heparin for anticoagulation.  Heparin level slightly above goal  Goal of Therapy:  Heparin level 0.3-0.7 units/ml  Monitor platelets by anticoagulation protocol: Yes   Plan:  Reduce Heparin to 850 units/hr Check anti-Xa level daily while on heparin Continue to monitor H&H and platelets  Mady Gemma, RPH  05/31/2016,10:45 PM

## 2016-05-31 NOTE — Progress Notes (Signed)
PROGRESS NOTE    Raymond [name]  ZOX:[id number] DOB: [birth year] DOA: 05/20/2016 PCP: Rudi Heap, MD   Brief Narrative:  83 year old male with a history of chronic respiratory failure on 4 L of oxygen, COPD, interstitial lung disease/pulmonary fibrosis, came to the hospital with worsening shortness of breath and found to have possible pneumonia, COPD exacerbation. Started on IV steroids, antibiotics and bronchodilators. Slowly improving and not able to expectorate much sputum. Pulmonology following and appreciate recc's. Still remains very SOB and dyspneic. Ordered ECHOcardiogram to evaluate for Heart Failure and ordered CT A of Chest because patient was still dyspneic to r/o Pulmonary Embouls. Unfortunately patient went into Atrial Fibrillation with RVR but was running in 115-120's so Heparin gtt was started and Cardiology was consulted.   Assessment & Plan:   Active Problems:   Hypothyroidism   Hyperlipidemia, acquired   PULMONARY FIBROSIS ILD POST INFLAMMATORY CHRONIC   CAP (community acquired pneumonia)   Pulmonary fibrosis (HCC)   Hyperglycemia, drug-induced   COPD with exacerbation (HCC)   Acute on chronic respiratory failure (HCC)  1. Acute on chronic respiratory failure with Hypoxia.  -Suspect this is related to pneumonia as well as COPD/pulmonary fibrosis.  -Currently on slightly higher than baseline oxygen requirement of 4 L.  -Patient likely has a component of dysphagia. Speech therapy following the patient underwent modified barium swallow. He was found to have a small Zenker's diverticulum. Does not appear to be a good candidate for repair. He is continued on a dysphagia 3 diet.. -Added Sodium Chloride 0.9% Neb Solution 3 mL Daily -C/w Chest Physiotherapy -Gave another dose of Lasix IV 40 mg today; s/p 2 doses of IV Lasix 40 mg now -C/w Flutter Valve -Added Incentive Spirometry -Ordered CTA to rule out Pulmonary Emboulus and CTA showed no evidence of acute pulmonary  thromboembolism, stable pulmonary Fibrosis, and that the confluent consolidation towards the Right Lung base has improved supporting resolution of prior inflammatory process.  2. New Onset Atrial Fibrillation with RVR -C/w Telemetry -Placed on Cardizem 30 mg q8h -Started Heparin gtt; INR was 0.93 -ECHOcardiogram done earlier -Appreciate Cardiology Consultation and Evaluation for Recc's  3. Probable Community-Acquired Pneumonia.  -S/p Treatment with Ceftriaxone and Azithromycin 10 day treatment  -Influenza panel negative. -Improved per CT Findings.   4. COPD Exacerbation.  -Continue on IV Methylprednisolone 60 mg IV q6h, Abx with po Azithromycin 500 mg po Daily and Ceftriaxone 1 gram q24h and Bronchodilators; C/w IH DuoNebs, Albuterol, and Mucomyst.  -Continue pulmonary hygiene and Chest PT -Added 0.9% Nebulizer Solution Daily -Repeated IV Lasix today  5. Interstitial lung Disease/Pulmonary Fibrosis.  -Continue Mycophenolate 1000 mg po BID and IV Methylprednisolone 60 mg IV q6h. -Pulmonology Dr. Juanetta Gosling following and appreciate Recc's.  -Added Chest PT and will try to continue q4h -His baseline functional status appears to be quite poor. -CXR on 05/27/16 showed Underlying chronic interstitial lung disease -CT Angio Chest PE showed stable Pulmonary Fibrosis.  -Will consider a Palliative Care Medicine Consultation in AM as patient not progressing significantly   6. Left Upper Arm Edema and Swelling, improved -Hand significantly swollen; Likely dependent edema -Gave another dose of IV Lasix 40 mg  -Ultrasound of Upper Extremity showed no evidence of Left Upper Extremity DVT -Advised to prop arm up and OOB and Ambulate  7. Volume Overload ? Acute Diastolic CHF -CXR on 05/27/2016 showed Cardiomegaly with progressive bilateral from interstitial prominence noted consistent CHF. Pneumonitis cannot be excluded. Underlying chronic interstitial lung disease. Contrast in the colon. No  acute bony  abnormality . -Gave 2 Doses of IV Lasix  -Last ECHO showed EF of 55-60% with No Regional Wall Motion Abnormalities and showed borderline Myocardial Hypertrophy with Mild Disproportionate focal Basal Septal Hypertrophy -Repeat ECHO showed EF of 65-70% with wall motion normal; Study not sufficient to allow evaluation of LV diastolic function (likely from Atrial Fibrillation with RVR) -Daily Weights, Strict I's and O's -BNP 67.0 -Appreciate Cardiac Evaluation and Consultation for further Reccomendations  8. Enlarging Cystic Abnormality in the Head of the Pancreas -CT PE showed Enlarging cystic abnormality in the head of the pancreas compared with 2013. Slow-growing cystic neoplasm is not excluded.  -MRI of the pancreas is recommended. -Will discuss with Patient if wants to pursue further workup  9. Hyperglycemia in the Setting of Diabetes Mellitus Type 2 and Steroids   -Felt to be related to Steroids.  -Hemoglobin A1c 7.6.  -On Resistant Novolog Sliding Scale Insulin and Lantus 10 units sq daily.  -CBG's have ranged from 197-314 -Appreciate Consult Diabetes Education Coordinator for assistance -Will add Novolog 4 units TID wm per Diabetic Coordinator Recc's  10. Hypothyroidism. -TSH on 04/30/16 was 1.720 -Continue Synthroid 12.5 mcg po MWF and Levothyroxine 125 mcg po Daily   11. Hyperlipidemia.  -Continue Atorvastatin 10 mg po Daily  12. BPH -C/w Terazosin 10 mg po qHS  13. CAD -C/w ASA 325 mg po Daily and Atorvastatin 10 mg po Daily  14. Constipation, improving -Gave another Bisacodyl 10 mg Suppository x1 today -Added Senna-Docusate 1 tab po BID and Miralax 17 g po BID -If still not improving tomorrow will order another Enema  15. Leukocytosis -Likely Reactive from Steroid Use; Patient's WBC now 14.9 -> 13.8 -Continue to Monitor and repeat CBC in AM  DVT prophylaxis: Lovenox 40 mg sq q24h Code Status: Partial Code; No CPR, No Defibrillation  Family Communication: Discussed  with Wife at Bedside Disposition Plan: Home Health PT when stable with Rollator Walker; Will discuss case with Palliative Care in AM  Consultants:   Pulmonology   Procedures:  Left Upper Arm Venous Ultrasound - No evidence of left upper extremity deep venous thrombosis.  ECHOCARDIOGRAM- Study Conclusions  - Left ventricle: The cavity size was normal. Wall thickness was   increased in a pattern of mild LVH. Systolic function was   vigorous. The estimated ejection fraction was in the range of 65%   to 70%. Wall motion was normal; there were no regional wall   motion abnormalities. The study is not technically sufficient to   allow evaluation of LV diastolic function. - Aortic valve: Mildly calcified annulus. Trileaflet; mildly   thickened leaflets. Valve area (VTI): 3.05 cm^2. Valve area   (Vmax): 3.2 cm^2. Valve area (Vmean): 2.98 cm^2. - Technically adequate study.   Antimicrobials:  Anti-infectives    Start     Dose/Rate Route Frequency Ordered Stop   05/24/16 1000  azithromycin (ZITHROMAX) tablet 500 mg  Status:  Discontinued     500 mg Oral Daily 05/23/16 1113 05/29/16 1826   05/21/16 1030  azithromycin (ZITHROMAX) 500 mg in dextrose 5 % 250 mL IVPB  Status:  Discontinued     500 mg 250 mL/hr over 60 Minutes Intravenous Every 24 hours 05/20/16 1410 05/23/16 1113   05/21/16 1000  cefTRIAXone (ROCEPHIN) 1 g in dextrose 5 % 50 mL IVPB  Status:  Discontinued     1 g 100 mL/hr over 30 Minutes Intravenous Every 24 hours 05/20/16 1410 05/29/16 1826   05/20/16 1000  cefTRIAXone (  ROCEPHIN) 1 g in dextrose 5 % 50 mL IVPB     1 g 100 mL/hr over 30 Minutes Intravenous  Once 05/20/16 0945 05/20/16 1053   05/20/16 1000  azithromycin (ZITHROMAX) 500 mg in dextrose 5 % 250 mL IVPB     500 mg 250 mL/hr over 60 Minutes Intravenous  Once 05/20/16 0945 05/20/16 1201     Subjective: Seen and examined at bedside this AM and stated swelling was improved but was still very SOB. Was  lightheaded and felt like he was going to pass out when using bedside commode and was found to be in Atrial Fibrillation with RVR. CT PE Chest, Echocardiogram was obtained and patient was started on Heparin gtt and Cardiology was consulted. States he sometimes feels the palpitations. No Nausea or Vomiting. No other concerns or complaints but still SOB.  Objective: Vitals:   05/31/16 1024 05/31/16 1202 05/31/16 1319 05/31/16 1536  BP: 90/61  (!) 108/55   Pulse:   (!) 126   Resp:      Temp:      TempSrc:      SpO2:  91% 96% 97%  Weight:      Height:        Intake/Output Summary (Last 24 hours) at 05/31/16 1723 Last data filed at 05/31/16 1600  Gross per 24 hour  Intake              790 ml  Output              550 ml  Net              240 ml   Filed Weights   05/20/16 1345 05/21/16 0500 05/30/16 1700  Weight: 75.2 kg (165 lb 12.6 oz) 76.7 kg (169 lb 1.5 oz) 75.5 kg (166 lb 6.4 oz)   Examination: Physical Exam:  Constitutional: NAD and appears calm and slightly uncomfortable breathing wise Eyes:  Lids and conjunctivae normal, sclerae anicteric  ENMT: External Ears, Nose appear normal. Grossly normal hearing. Neck: Appears normal, supple, no cervical masses, normal ROM, no appreciable thyromegaly Respiratory: Diminished to auscultation bilaterally with expiratory wheezing and rhodochrous breath sounds. Slightly Increased respiratory effort . No accessory muscle use but wearing O2 via [place].  Cardiovascular: Irregularly Irregular Rhythm and is Tachycardic, no murmurs / rubs / gallops. S1 and S2 auscultated. Improved extremity edema especially in upper Extremities. Trace Lower extremity swelling.  Abdomen: Soft, non-tender, non-distended. No masses palpated. No appreciable hepatosplenomegaly. Bowel sounds positive x4.  GU: Deferred. Musculoskeletal: No clubbing / cyanosis of digits/nails. No joint deformity upper and lower extremities. Normal strength and muscle tone.  Skin: No rashes,  lesions, ulcers on limited skin evaluation. No induration; Warm and dry.  Neurologic: CN 2-12 grossly intact with no focal deficits. Romberg sign cerebellar reflexes not assessed.  Psychiatric: Normal judgment and insight. Alert and oriented x 3. Normal mood and appropriate affect.   Data Reviewed: I have personally reviewed following labs and imaging studies  CBC:  Recent Labs Lab 05/27/16 0524 05/28/16 0455 05/29/16 0647 05/30/16 0609 05/31/16 0606  WBC 12.7* 12.7* 11.6* 14.9* 13.8*  NEUTROABS 11.9* 11.9* 10.9* 14.2* 13.0*  HGB 12.0* 11.8* 11.5* 12.0* 11.9*  HCT 38.7* 37.8* 36.8* 38.4* 38.1*  MCV 96.0 96.2 97.1 97.0 97.2  PLT 199 197 198 182 185   Basic Metabolic Panel:  Recent Labs Lab 05/27/16 0524 05/28/16 0455 05/29/16 0647 05/30/16 0609 05/31/16 0606  NA 139 137 137 141 141  K 4.4 4.4  4.0 4.1 4.0  CL 93* 93* 93* 91* 90*  CO2 36* 38* 39* 42* 44*  GLUCOSE 168* 157* 200* 198* 177*  BUN 28* 29* 28* 30* 34*  CREATININE 0.64 0.55* 0.57* 0.60* 0.64  CALCIUM 8.8* 8.7* 8.8* 9.0 9.2  MG 2.0 2.0 1.9 1.8 1.9  PHOS 3.3 3.2 3.3 3.3 3.2   GFR: Estimated Creatinine Clearance: 60.9 mL/min (by C-G formula based on SCr of 0.64 mg/dL). Liver Function Tests:  Recent Labs Lab 05/27/16 0524 05/28/16 0455 05/29/16 0647 05/30/16 0609 05/31/16 0606  AST 31 36 32 37 37  ALT 39 43 47 50 49  ALKPHOS 36* 36* 40 43 41  BILITOT 0.9 1.0 0.8 0.8 0.7  PROT 5.4* 5.3* 5.1* 5.4* 5.3*  ALBUMIN 2.9* 2.8* 2.9* 3.1* 2.9*   No results for input(s): LIPASE, AMYLASE in the last 168 hours. No results for input(s): AMMONIA in the last 168 hours. Coagulation Profile:  Recent Labs Lab 05/31/16 1406  INR 0.93   Cardiac Enzymes: No results for input(s): CKTOTAL, CKMB, CKMBINDEX, TROPONINI in the last 168 hours. BNP (last 3 results) No results for input(s): PROBNP in the last 8760 hours. HbA1C: No results for input(s): HGBA1C in the last 72 hours. CBG:  Recent Labs Lab 05/30/16 1605  05/30/16 2110 05/31/16 0732 05/31/16 1123 05/31/16 1619  GLUCAP 164* 210* 157* 342* 131*   Lipid Profile: No results for input(s): CHOL, HDL, LDLCALC, TRIG, CHOLHDL, LDLDIRECT in the last 72 hours. Thyroid Function Tests: No results for input(s): TSH, T4TOTAL, FREET4, T3FREE, THYROIDAB in the last 72 hours. Anemia Panel: No results for input(s): VITAMINB12, FOLATE, FERRITIN, TIBC, IRON, RETICCTPCT in the last 72 hours. Sepsis Labs: No results for input(s): PROCALCITON, LATICACIDVEN in the last 168 hours.  No results found for this or any previous visit (from the past 240 hour(s)).  Radiology Studies: Ct Angio Chest Pe W Or Wo Contrast  Result Date: 05/31/2016 CLINICAL DATA:  Chronic short of breath. History of interstitial lung disease. EXAM: CT ANGIOGRAPHY CHEST WITH CONTRAST TECHNIQUE: Multidetector CT imaging of the chest was performed using the standard protocol during bolus administration of intravenous contrast. Multiplanar CT image reconstructions and MIPs were obtained to evaluate the vascular anatomy. CONTRAST:  100 cc Isovue 370 COMPARISON:  07/21/2015 FINDINGS: Cardiovascular: There are no filling defects in the pulmonary arterial tree to suggest acute pulmonary thromboembolism. Scattered atherosclerotic calcifications in the aortic arch, great vessels, and coronary arteries. Mediastinum/Nodes: Prominent mediastinal fat. No abnormal mediastinal adenopathy. Thyroid is not visualized. Lungs/Pleura: Lungs are very under aerated. Fibrotic changes throughout the right lung are not significantly changed. Confluent consolidation seen on the prior study has largely resolved. Some minimal confluent opacities towards the right lung base likely reflect volume loss. Similar areas of sized segmental atelectasis at the left base. Minimal of fibrotic changes in the peripheral left upper lobe. Upper Abdomen: Postcholecystectomy. There is a cystic lesion in the pancreatic head measuring 2.8 cm. It is  stable compared with 07/21/2015 but enlarged when compared to the chest CT dated 03/27/2012. Today it measures up to 2.8 cm. Musculoskeletal: No vertebral compression deformity. Review of the MIP images confirms the above findings. IMPRESSION: No evidence of acute pulmonary thromboembolism Stable pulmonary fibrosis Confluent consolidation towards the right lung base has improved supporting resolution of a prior inflammatory process. Residual confluent densities likely will flank volume loss Enlarging cystic abnormality in the head of the pancreas compared with 2013. Slow-growing cystic neoplasm is not excluded. MRI of the pancreas is recommended.  Electronically Signed   By: Jolaine Click M.D.   On: 05/31/2016 15:43   US Venous Img Upper Uni Left  Result Date: 05/30/2016 CLINICAL DATA:  Left upper extremity edema, pain and erythema. EXAM: LEFT UPPER EXTREMITY VENOUS DOPPLER ULTRASOUND TECHNIQUE: Gray-scale sonography with graded compression, as well as color Doppler and duplex ultrasound were performed to evaluate the upper extremity deep venous system from the level of the subclavian vein and including the jugular, axillary, basilic, radial, ulnar and upper cephalic vein. Spectral Doppler was utilized to evaluate flow at rest and with distal augmentation maneuvers. COMPARISON:  None. FINDINGS: Contralateral Subclavian Vein: Respiratory phasicity is normal and symmetric with the symptomatic side. No evidence of thrombus. Normal compressibility. Internal Jugular Vein: No evidence of thrombus. Normal compressibility, respiratory phasicity and response to augmentation. Subclavian Vein: No evidence of thrombus. Normal compressibility, respiratory phasicity and response to augmentation. Axillary Vein: No evidence of thrombus. Normal compressibility, respiratory phasicity and response to augmentation. Cephalic Vein: No evidence of thrombus. Normal compressibility, respiratory phasicity and response to augmentation.  Basilic Vein: No evidence of thrombus. Normal compressibility, respiratory phasicity and response to augmentation. Brachial Veins: No evidence of thrombus. Normal compressibility, respiratory phasicity and response to augmentation. Radial Veins: No evidence of thrombus. Normal compressibility, respiratory phasicity and response to augmentation. Ulnar Veins: No evidence of thrombus. Normal compressibility, respiratory phasicity and response to augmentation. Venous Reflux:  None visualized. Other Findings: No evidence of superficial thrombophlebitis or abnormal fluid collection. IMPRESSION: No evidence of left upper extremity deep venous thrombosis. Electronically Signed   By: Irish Lack M.D.   On: 05/30/2016 13:56   Scheduled Meds: . acetylcysteine  3 mL Nebulization TID  . aspirin EC  325 mg Oral Daily  . atorvastatin  10 mg Oral Daily  . bisacodyl  10 mg Rectal Once  . bisacodyl  10 mg Rectal Once  . citalopram  20 mg Oral Daily  . diltiazem  30 mg Oral Q8H  . furosemide  40 mg Intravenous Once  . guaiFENesin  1,200 mg Oral BID  . insulin aspart  0-20 Units Subcutaneous TID WC  . insulin aspart  0-5 Units Subcutaneous QHS  . insulin glargine  10 Units Subcutaneous Daily  . levalbuterol  1.25 mg Nebulization Q4H WA   And  . ipratropium  0.5 mg Nebulization Q4H WA  . levothyroxine  12.5 mcg Oral Once per day on Mon Wed Fri  . levothyroxine  125 mcg Oral QAC breakfast  . methylPREDNISolone (SOLU-MEDROL) injection  60 mg Intravenous Q6H  . mycophenolate  1,000 mg Oral BID  . pantoprazole  40 mg Oral Daily  . polyethylene glycol  17 g Oral BID  . senna-docusate  1 tablet Oral BID  . sodium chloride  3 mL Nebulization Daily  . terazosin  10 mg Oral QHS   Continuous Infusions: . sodium chloride    . heparin 900 Units/hr (05/31/16 1429)    LOS: 11 days   Merlene Laughter, DO [facility] Pager [phone]  If 7PM-7AM, please contact night-coverage [email or web address] Password  [facility] 05/31/2016, 5:23 PM

## 2016-05-31 NOTE — Progress Notes (Signed)
Received call from central tele about pt HR sustaining in the 140's for about 20 mins, only coming down in the 120's. Monitor also reading A-fib and pt doesn't have a HX. MD notified.

## 2016-06-01 ENCOUNTER — Encounter (HOSPITAL_COMMUNITY): Payer: Self-pay | Admitting: Primary Care

## 2016-06-01 DIAGNOSIS — I4891 Unspecified atrial fibrillation: Secondary | ICD-10-CM

## 2016-06-01 DIAGNOSIS — K8689 Other specified diseases of pancreas: Secondary | ICD-10-CM

## 2016-06-01 DIAGNOSIS — J849 Interstitial pulmonary disease, unspecified: Secondary | ICD-10-CM

## 2016-06-01 DIAGNOSIS — Z515 Encounter for palliative care: Secondary | ICD-10-CM

## 2016-06-01 DIAGNOSIS — I480 Paroxysmal atrial fibrillation: Secondary | ICD-10-CM

## 2016-06-01 DIAGNOSIS — R0602 Shortness of breath: Secondary | ICD-10-CM

## 2016-06-01 DIAGNOSIS — Z7189 Other specified counseling: Secondary | ICD-10-CM

## 2016-06-01 DIAGNOSIS — E876 Hypokalemia: Secondary | ICD-10-CM

## 2016-06-01 LAB — CBC WITH DIFFERENTIAL/PLATELET
Basophils Absolute: 0 10*3/uL (ref 0.0–0.1)
Basophils Relative: 0 %
EOS PCT: 0 %
Eosinophils Absolute: 0 10*3/uL (ref 0.0–0.7)
HCT: 37.9 % — ABNORMAL LOW (ref 39.0–52.0)
Hemoglobin: 11.7 g/dL — ABNORMAL LOW (ref 13.0–17.0)
LYMPHS ABS: 0.3 10*3/uL — AB (ref 0.7–4.0)
LYMPHS PCT: 2 %
MCH: 29.9 pg (ref 26.0–34.0)
MCHC: 30.9 g/dL (ref 30.0–36.0)
MCV: 96.9 fL (ref 78.0–100.0)
MONO ABS: 0.4 10*3/uL (ref 0.1–1.0)
Monocytes Relative: 3 %
Neutro Abs: 14.9 10*3/uL — ABNORMAL HIGH (ref 1.7–7.7)
Neutrophils Relative %: 95 %
PLATELETS: 169 10*3/uL (ref 150–400)
RBC: 3.91 MIL/uL — AB (ref 4.22–5.81)
RDW: 13.5 % (ref 11.5–15.5)
WBC: 15.6 10*3/uL — ABNORMAL HIGH (ref 4.0–10.5)

## 2016-06-01 LAB — GLUCOSE, CAPILLARY
GLUCOSE-CAPILLARY: 249 mg/dL — AB (ref 65–99)
Glucose-Capillary: 191 mg/dL — ABNORMAL HIGH (ref 65–99)
Glucose-Capillary: 256 mg/dL — ABNORMAL HIGH (ref 65–99)
Glucose-Capillary: 363 mg/dL — ABNORMAL HIGH (ref 65–99)
Glucose-Capillary: 83 mg/dL (ref 65–99)

## 2016-06-01 LAB — MAGNESIUM: MAGNESIUM: 1.9 mg/dL (ref 1.7–2.4)

## 2016-06-01 LAB — COMPREHENSIVE METABOLIC PANEL
ALT: 51 U/L (ref 17–63)
AST: 38 U/L (ref 15–41)
Albumin: 2.8 g/dL — ABNORMAL LOW (ref 3.5–5.0)
Alkaline Phosphatase: 43 U/L (ref 38–126)
Anion gap: 10 (ref 5–15)
BILIRUBIN TOTAL: 0.9 mg/dL (ref 0.3–1.2)
BUN: 36 mg/dL — ABNORMAL HIGH (ref 6–20)
CALCIUM: 9 mg/dL (ref 8.9–10.3)
CHLORIDE: 87 mmol/L — AB (ref 101–111)
CO2: 41 mmol/L — ABNORMAL HIGH (ref 22–32)
Creatinine, Ser: 0.57 mg/dL — ABNORMAL LOW (ref 0.61–1.24)
Glucose, Bld: 204 mg/dL — ABNORMAL HIGH (ref 65–99)
Potassium: 3.2 mmol/L — ABNORMAL LOW (ref 3.5–5.1)
Sodium: 138 mmol/L (ref 135–145)
TOTAL PROTEIN: 5.1 g/dL — AB (ref 6.5–8.1)

## 2016-06-01 LAB — HEPARIN LEVEL (UNFRACTIONATED): Heparin Unfractionated: 0.91 IU/mL — ABNORMAL HIGH (ref 0.30–0.70)

## 2016-06-01 LAB — PHOSPHORUS: PHOSPHORUS: 3 mg/dL (ref 2.5–4.6)

## 2016-06-01 MED ORDER — INSULIN ASPART 100 UNIT/ML ~~LOC~~ SOLN
6.0000 [IU] | Freq: Three times a day (TID) | SUBCUTANEOUS | Status: DC
  Administered 2016-06-02: 12:00:00 via SUBCUTANEOUS
  Administered 2016-06-02 – 2016-06-03 (×3): 6 [IU] via SUBCUTANEOUS

## 2016-06-01 MED ORDER — POTASSIUM CHLORIDE CRYS ER 20 MEQ PO TBCR
40.0000 meq | EXTENDED_RELEASE_TABLET | Freq: Two times a day (BID) | ORAL | Status: AC
  Administered 2016-06-01 (×2): 40 meq via ORAL
  Filled 2016-06-01 (×2): qty 2

## 2016-06-01 MED ORDER — FUROSEMIDE 40 MG PO TABS
40.0000 mg | ORAL_TABLET | Freq: Every day | ORAL | Status: DC
  Administered 2016-06-01 – 2016-06-03 (×3): 40 mg via ORAL
  Filled 2016-06-01 (×3): qty 1

## 2016-06-01 MED ORDER — MORPHINE SULFATE (CONCENTRATE) 10 MG/0.5ML PO SOLN
2.5000 mg | ORAL | Status: DC | PRN
  Administered 2016-06-01 – 2016-06-02 (×3): 2.6 mg via ORAL
  Filled 2016-06-01 (×3): qty 0.5

## 2016-06-01 MED ORDER — HEPARIN (PORCINE) IN NACL 100-0.45 UNIT/ML-% IJ SOLN
750.0000 [IU]/h | INTRAMUSCULAR | Status: DC
  Administered 2016-06-01: 750 [IU]/h via INTRAVENOUS

## 2016-06-01 MED ORDER — INSULIN GLARGINE 100 UNIT/ML ~~LOC~~ SOLN
12.0000 [IU] | Freq: Every day | SUBCUTANEOUS | Status: DC
  Administered 2016-06-02 – 2016-06-03 (×2): 12 [IU] via SUBCUTANEOUS
  Filled 2016-06-01 (×3): qty 0.12

## 2016-06-01 MED ORDER — APIXABAN 5 MG PO TABS
5.0000 mg | ORAL_TABLET | Freq: Two times a day (BID) | ORAL | Status: DC
  Administered 2016-06-01 – 2016-06-03 (×5): 5 mg via ORAL
  Filled 2016-06-01 (×5): qty 1

## 2016-06-01 NOTE — Consult Note (Signed)
Primary cardiologist: N/A Consulting cardiologist: Dr Dina Rich MD Requesting physician: Dr Marland Mcalpine Indication: SOB  Clinical Summary Raymond [name] is a 83 y.o.male history of intersitial lung disease, COPD, hyperlipidemia admitted 05/20/16 with SOB. He was treated for possible COPD exacerbation and pneumonia. Cardiology is consulted for new onset afib. Patient reports intermittent palpitations at times.     Hgb 11.7 Plt 169 WBC 15.6 K 3.2 Cr 0.57 BUN 36 Mg 1.9  CT PE no PE. Stable pulmonary fibrosis. Improving RLL consolidatrion LUE [place]: no DVT 05/31/16 Echo: LVEF 65-70%, no WMAs, cannot evaluation diastolic function, normal RV EKG SR no ischemic changes    Allergies  Allergen Reactions  . Codeine Other (See Comments)    hallucinations  . Levaquin [Levofloxacin In D5w] Other (See Comments)    Tendonitis    Medications Scheduled Medications: . acetylcysteine  3 mL Nebulization TID  . aspirin EC  325 mg Oral Daily  . atorvastatin  10 mg Oral Daily  . bisacodyl  10 mg Rectal Once  . bisacodyl  10 mg Rectal Once  . citalopram  20 mg Oral Daily  . diltiazem  30 mg Oral Q8H  . furosemide  40 mg Intravenous Once  . guaiFENesin  1,200 mg Oral BID  . insulin aspart  0-20 Units Subcutaneous TID WC  . insulin aspart  0-5 Units Subcutaneous QHS  . insulin aspart  4 Units Subcutaneous TID WC  . insulin glargine  10 Units Subcutaneous Daily  . levalbuterol  1.25 mg Nebulization Q4H WA   And  . ipratropium  0.5 mg Nebulization Q4H WA  . levothyroxine  12.5 mcg Oral Once per day on Mon Wed Fri  . levothyroxine  125 mcg Oral QAC breakfast  . methylPREDNISolone (SOLU-MEDROL) injection  60 mg Intravenous Q6H  . mycophenolate  1,000 mg Oral BID  . pantoprazole  40 mg Oral Daily  . polyethylene glycol  17 g Oral BID  . potassium chloride  40 mEq Oral BID  . senna-docusate  1 tablet Oral BID  . sodium chloride  3 mL Nebulization Daily  . terazosin  10 mg Oral QHS      Infusions: . sodium chloride    . heparin 750 Units/hr (06/01/16 0827)     PRN Medications:  acetaminophen **OR** acetaminophen, albuterol, ondansetron **OR** ondansetron (ZOFRAN) IV, sodium chloride   Past Medical History:  Diagnosis Date  . Achilles tendon rupture   . Arthritis   . BPH (benign prostatic hyperplasia)   . CAD (coronary artery disease)   . Cataracts, bilateral   . COPD (chronic obstructive pulmonary disease) (HCC)   . GERD (gastroesophageal reflux disease)   . Hyperlipemia   . Hypothyroid   . ILD (interstitial lung disease) (HCC)   . Prostate hyperplasia, benign localized, with urinary obstruction     Past Surgical History:  Procedure Laterality Date  . ACHILLES TENDON SURGERY  08/20/2011   Procedure: ACHILLES TENDON REPAIR;  Surgeon: Sherri Rad, MD;  Location:  [facility];  Service: Orthopedics;  Laterality: Right;  Right primary achilles tendon repair, gastroc slide  . APPENDECTOMY    . CHOLECYSTECTOMY    . COLONOSCOPY    . EYE SURGERY     cataracts    Family History  Problem Relation Age of Onset  . Heart disease Mother 60    rheumatic heart disease  . Stroke Father   . Hypertension Father   . Cancer Sister     breast  .  Hypertension Brother   . Hypertension Brother   . Hypertension Brother     Social History Raymond [name] reports that he quit smoking about 36 years ago. His smoking use included Cigarettes. He has a 60.00 pack-year smoking history. He has never used smokeless tobacco. Raymond [name] reports that he does not drink alcohol.  Review of Systems CONSTITUTIONAL: No weight loss, fever, chills, weakness or fatigue.  HEENT: Eyes: No visual loss, blurred vision, double vision or yellow sclerae. No hearing loss, sneezing, congestion, runny nose or sore throat.  SKIN: No rash or itching.  CARDIOVASCULAR: per HPI RESPIRATORY: No shortness of breath, cough or sputum.  GASTROINTESTINAL: No anorexia, nausea,  vomiting or diarrhea. No abdominal pain or blood.  GENITOURINARY: no polyuria, no dysuria NEUROLOGICAL: No headache, dizziness, syncope, paralysis, ataxia, numbness or tingling in the extremities. No change in bowel or bladder control.  MUSCULOSKELETAL: No muscle, back pain, joint pain or stiffness.  HEMATOLOGIC: No anemia, bleeding or bruising.  LYMPHATICS: No enlarged nodes. No history of splenectomy.  PSYCHIATRIC: No history of depression or anxiety.      Physical Examination Blood pressure 110/69, pulse 95, temperature 98 F (36.7 C), temperature source Oral, resp. rate 20, height 5\' 6"  (1.676 m), weight 166 lb (75.3 kg), SpO2 91 %.  Intake/Output Summary (Last 24 hours) at 06/01/16 1119 Last data filed at 06/01/16 0900  Gross per 24 hour  Intake              540 ml  Output              850 ml  Net             -310 ml    HEENT: sclera clear, throat clear  Cardiovascular: RRR, no m/r/g, no jvd  Respiratory: severe bilateral coarse breath sounds.   GI: abdomen soft, NT, ND  MSK: trace bilateral LE edema  Neuro: no focal deficits  Psych: appropriate affect   Lab Results  Basic Metabolic Panel:  Recent Labs Lab 05/28/16 0455 05/29/16 0647 05/30/16 0609 05/31/16 0606 06/01/16 0446  NA 137 137 141 141 138  K 4.4 4.0 4.1 4.0 3.2*  CL 93* 93* 91* 90* 87*  CO2 38* 39* 42* 44* 41*  GLUCOSE 157* 200* 198* 177* 204*  BUN 29* 28* 30* 34* 36*  CREATININE 0.55* 0.57* 0.60* 0.64 0.57*  CALCIUM 8.7* 8.8* 9.0 9.2 9.0  MG 2.0 1.9 1.8 1.9 1.9  PHOS 3.2 3.3 3.3 3.2 3.0    Liver Function Tests:  Recent Labs Lab 05/28/16 0455 05/29/16 0647 05/30/16 0609 05/31/16 0606 06/01/16 0446  AST 36 32 37 37 38  ALT 43 47 50 49 51  ALKPHOS 36* 40 43 41 43  BILITOT 1.0 0.8 0.8 0.7 0.9  PROT 5.3* 5.1* 5.4* 5.3* 5.1*  ALBUMIN 2.8* 2.9* 3.1* 2.9* 2.8*    CBC:  Recent Labs Lab 05/28/16 0455 05/29/16 0647 05/30/16 0609 05/31/16 0606 06/01/16 0446  WBC 12.7* 11.6*  14.9* 13.8* 15.6*  NEUTROABS 11.9* 10.9* 14.2* 13.0* 14.9*  HGB 11.8* 11.5* 12.0* 11.9* 11.7*  HCT 37.8* 36.8* 38.4* 38.1* 37.9*  MCV 96.2 97.1 97.0 97.2 96.9  PLT 197 198 182 185 169    Cardiac Enzymes: No results for input(s): CKTOTAL, CKMB, CKMBINDEX, TROPONINI in the last 168 hours.  BNP: Invalid input(s): POCBNP   ECG   Imaging   Impression/Recommendations  1. PAF - telemetry reviewed, patient was in afib yesterday. Converted back to NSR today. - would continue  dilt for rate control. With severe respiratory issues some tachycardia is expected. If stable bp's would consolidate to diltiazem 120mg  long acting tomorrow.  CHADS2Vasc score is at least 2 based on age. Would change heparin gtt to eliquis 5mg  bid. Will consult pharmacy to help with dosing and clarify ok with his immunosuppressants, if ok then d/c heparin.   2. Volume overload - echo with hyperdynamic LV, no significant RV dysfunction. Could not measure diastolic dysfunction but would presume some likely exists, he also has at least mild to moderate pulm HTN by echo, likely due to his ILD - do not think volume overload is playing much role in his ongoing SOB. Can try keeping him more on the drier side, start lasix 40mg  daily PO and follow  3. CAD - unclear history, this is not well described in his record. I don't see any documentation confirming CAD. He had a prior admission for atypical chest pain in 2009 with normal stress test. CT PE shows some evidence of coronary calcification - no acute issues, defer management to his primary physician.      Dina Rich, M.D.

## 2016-06-01 NOTE — Consult Note (Signed)
Consultation Note Date: 06/01/2016   Patient Name: Raymond [name]  DOB: 1929-08-25  MRN: [id number]  Age / Sex: 83 y.o., male  PCP: Ernestina Pennaonald W Moore, MD Referring Physician: Merlene Laughtermair Latif Sheikh, DO  Reason for Consultation: Establishing goals of care and Non pain symptom management  HPI/Patient Profile: 83 y.o. male  with past medical history of Arthritis, Achilles tendon rupture, BPH, CAD, COPD, GER D, increased cholesterol, interstitial lung disease admitted on 05/20/2016 with acute on chronic respiratory failure with hypoxia, pneumonia that is improving, interstitial lung disease.   Clinical Assessment and Goals of Care: Raymond [name] is sitting on the potty chair in his room with his wife at his side. He appears to be fatigued and chronically ill. He is able to communicate his needs effectively. We talk about his breathlessness. He and wife agree to a PO trial of low-dose morphine to help with breathlessness/dyspnea. Raymond [name] is opioid nave. We talk about normal side effects of Simon once and nausea that pass after 24 to 36 hours. Nursing staff at bedside notified of Raymond [name]'s acceptance of PO trial of morphine. We have limited interaction today due to Raymond [name]'s frailty and respiratory status.  Healthcare power of attorney NEXT OF KIN - wife Raymond [name], no children.   SUMMARY OF RECOMMENDATIONS   continue to treat the treatable, continue with code status and end-of-life discussions.  Code Status/Advance Care Planning:  Limited code - no chest compressions but okay for intubation/ventilation  Symptom Management:   per hospitalist, patient and wife agreed to trial of morphine 2.5 mg PO Q3 hours PRN dyspnea  Palliative Prophylaxis:   Oral Care  Additional Recommendations (Limitations, Scope, Preferences):  Continue to treat the treatable, but no chest compressions, yes  to intubation/ventilation  Psycho-social/Spiritual:   Desire for further Chaplaincy support:no  Additional Recommendations: Caregiving  Support/Resources and Education on Hospice  Prognosis:   < 6 months, would not be surprising based on frailty, functional decline.  Discharge Planning: To Be Determined      Primary Diagnoses: Present on Admission: . Acute on chronic respiratory failure (HCC) . PULMONARY FIBROSIS ILD POST INFLAMMATORY CHRONIC . Pulmonary fibrosis (HCC) . Hypothyroidism . COPD with exacerbation (HCC) . CAP (community acquired pneumonia) . Hyperlipidemia, acquired . Hyperglycemia, drug-induced   I have reviewed the medical record, interviewed the patient and family, and examined the patient. The following aspects are pertinent.  Past Medical History:  Diagnosis Date  . Achilles tendon rupture   . Arthritis   . BPH (benign prostatic hyperplasia)   . CAD (coronary artery disease)   . Cataracts, bilateral   . COPD (chronic obstructive pulmonary disease) (HCC)   . GERD (gastroesophageal reflux disease)   . Hyperlipemia   . Hypothyroid   . ILD (interstitial lung disease) (HCC)   . Prostate hyperplasia, benign localized, with urinary obstruction    Social History   Social History  . Marital status: Married    Spouse name: N/A  . Number of children: N/A  . Years of  education: N/A   Occupational History  . truck driver    Social History Main Topics  . Smoking status: Former Smoker    Packs/day: 2.00    Years: 30.00    Types: Cigarettes    Quit date: 03/29/1980  . Smokeless tobacco: Never Used  . Alcohol use No  . Drug use: No  . Sexual activity: Not Asked   Other Topics Concern  . None   Social History Narrative  . None   Family History  Problem Relation Age of Onset  . Heart disease Mother 86    rheumatic heart disease  . Stroke Father   . Hypertension Father   . Cancer Sister     breast  . Hypertension Brother   . Hypertension  Brother   . Hypertension Brother    Scheduled Meds: . acetylcysteine  3 mL Nebulization TID  . apixaban  5 mg Oral BID  . aspirin EC  325 mg Oral Daily  . atorvastatin  10 mg Oral Daily  . bisacodyl  10 mg Rectal Once  . bisacodyl  10 mg Rectal Once  . citalopram  20 mg Oral Daily  . diltiazem  30 mg Oral Q8H  . furosemide  40 mg Intravenous Once  . furosemide  40 mg Oral Daily  . guaiFENesin  1,200 mg Oral BID  . insulin aspart  0-20 Units Subcutaneous TID WC  . insulin aspart  0-5 Units Subcutaneous QHS  . insulin aspart  6 Units Subcutaneous TID WC  . [START ON 06/02/2016] insulin glargine  12 Units Subcutaneous Daily  . levalbuterol  1.25 mg Nebulization Q4H WA   And  . ipratropium  0.5 mg Nebulization Q4H WA  . levothyroxine  12.5 mcg Oral Once per day on Mon Wed Fri  . levothyroxine  125 mcg Oral QAC breakfast  . methylPREDNISolone (SOLU-MEDROL) injection  60 mg Intravenous Q6H  . mycophenolate  1,000 mg Oral BID  . pantoprazole  40 mg Oral Daily  . polyethylene glycol  17 g Oral BID  . potassium chloride  40 mEq Oral BID  . senna-docusate  1 tablet Oral BID  . sodium chloride  3 mL Nebulization Daily  . terazosin  10 mg Oral QHS   Continuous Infusions: . sodium chloride     PRN Meds:.acetaminophen **OR** acetaminophen, albuterol, morphine CONCENTRATE, ondansetron **OR** ondansetron (ZOFRAN) IV, sodium chloride Medications Prior to Admission:  Prior to Admission medications   Medication Sig Start Date End Date Taking? Authorizing Provider  albuterol (PROVENTIL) (2.5 MG/3ML) 0.083% nebulizer solution Take 3 mLs (2.5 mg total) by nebulization every 6 (six) hours as needed for wheezing or shortness of breath. 07/25/15  Yes Elliot Cousin, MD  amoxicillin-clavulanate (AUGMENTIN) 875-125 MG tablet Take 1 tablet by mouth 2 (two) times daily. 03/31/16  Yes Ernestina Penna, MD  aspirin EC 325 MG tablet Take 325 mg by mouth daily.   Yes Historical Provider, MD  atorvastatin (LIPITOR)  10 MG tablet Take 1 tablet (10 mg total) by mouth daily. 11/26/15  Yes Ernestina Penna, MD  Cholecalciferol (VITAMIN D) 2000 UNITS CAPS Take 2,000-4,000 Units by mouth daily. Takes 2000 units everyday except for Fri, Sat, and Sun patient takes 4000 units.   Yes Historical Provider, MD  citalopram (CELEXA) 20 MG tablet Take 1 tablet (20 mg total) by mouth daily. 06/26/15 06/25/16 Yes Ernestina Penna, MD  levothyroxine (SYNTHROID, LEVOTHROID) 125 MCG tablet Take 1 tablet (125 mcg total) by mouth daily. 07/25/15  Yes Elliot Cousin, MD  levothyroxine (SYNTHROID, LEVOTHROID) 25 MCG tablet Take 12.5 mcg by mouth. Take Monday, Wednesday and Friday with the   Yes Historical Provider, MD  mycophenolate (CELLCEPT) 500 MG tablet Take 2 tablets by mouth 2 (two) times daily. 07/17/13  Yes Historical Provider, MD  omeprazole (PRILOSEC) 20 MG capsule Take 1 capsule (20 mg total) by mouth every evening. 06/26/15  Yes Ernestina Penna, MD  predniSONE (DELTASONE) 10 MG tablet Take 15 mg by mouth.  12/11/15  Yes Historical Provider, MD  terazosin (HYTRIN) 10 MG capsule Take 1 capsule (10 mg total) by mouth at bedtime. 06/26/15  Yes Ernestina Penna, MD   Allergies  Allergen Reactions  . Codeine Other (See Comments)    hallucinations  . Levaquin [Levofloxacin In D5w] Other (See Comments)    Tendonitis   Review of Systems  Unable to perform ROS: Acuity of condition    Physical Exam  Constitutional: No distress.  Frail appearing, chronically ill appearing, cooperative  HENT:  Head: Normocephalic and atraumatic.  Cardiovascular: Normal rate and regular rhythm.   Pulmonary/Chest: No respiratory distress.  Some work of breathing noted even at rest  Abdominal: Soft. He exhibits no distension.  Neurological: He is alert.  Orientation not questioned today  Skin: Skin is warm and dry.  Old healing scabs on face  Nursing note and vitals reviewed.   Vital Signs: BP 110/69 (BP Location: Left Arm)   Pulse 95   Temp  98 F (36.7 C) (Oral)   Resp 20   Ht 5\' 6"  (1.676 m)   Wt 75.3 kg (166 lb)   SpO2 98%   BMI 26.79 kg/m  Pain Assessment: No/denies pain   Pain Score: 0-No pain   SpO2: SpO2: 98 % O2 Device:SpO2: 98 % O2 Flow Rate: .O2 Flow Rate (L/min): 5 L/min  IO: Intake/output summary:  Intake/Output Summary (Last 24 hours) at 06/01/16 1507 Last data filed at 06/01/16 0900  Gross per 24 hour  Intake              540 ml  Output              300 ml  Net              240 ml    LBM: Last BM Date: 06/01/16 Baseline Weight: Weight: 74.8 kg (165 lb) Most recent weight: Weight: 75.3 kg (166 lb)     Palliative Assessment/Data:   Flowsheet Rows   Flowsheet Row Most Recent Value  Intake Tab  Referral Department  Hospitalist  Unit at Time of Referral  Med/Surg Unit  Palliative Care Primary Diagnosis  Pulmonary  Date Notified  06/01/16  Palliative Care Type  New Palliative care  Reason for referral  Clarify Goals of Care  Date of Admission  05/20/16  Date first seen by Palliative Care  06/01/16  # of days Palliative referral response time  0 Day(s)  # of days IP prior to Palliative referral  12  Clinical Assessment  Palliative Performance Scale Score  20%  Pain Max last 24 hours  Not able to report  Pain Min Last 24 hours  Not able to report  Dyspnea Max Last 24 Hours  Not able to report  Dyspnea Min Last 24 hours  Not able to report  Psychosocial & Spiritual Assessment  Palliative Care Outcomes  Patient/Family meeting held?  Yes  Who was at the meeting?  patient and wife  Palliative Care Outcomes  Improved non-pain symptom therapy  Palliative Care follow-up planned  -- [fu while at [facility]]      Time In: 1445 Time Out: 1515 Time Total: 30 minutes Greater than 50%  of this time was spent counseling and coordinating care related to the above assessment and plan.  Signed by: Katheran Awe, NP   Please contact Palliative Medicine Team phone at [phone] for questions and concerns.    For individual provider: See Loretha Stapler

## 2016-06-01 NOTE — Progress Notes (Signed)
PT Cancellation Note  Patient Details Name: Raymond SkainsWilliam H [name] MRN: [id number] DOB: May 06, 1929   Cancelled Treatment:    Reason Eval/Treat Not Completed: Other (comment) (Pt declined PT today, expressing he was too fatigued.  Pt requested that PT attempt back tomorrow. )   Waynetta SandyBeth Evoleht Hovatter, PT, DPT X: [phone]

## 2016-06-01 NOTE — Progress Notes (Addendum)
PROGRESS NOTE    Raymond [name]  ZOX:[id number] DOB: Jun 12, 1929 DOA: 05/20/2016 PCP: Rudi Heap, MD   Brief Narrative:  83 year old male with a history of chronic respiratory failure on 4 L of oxygen, COPD, interstitial lung disease/pulmonary fibrosis, came to the hospital with worsening shortness of breath and found to have possible pneumonia, COPD exacerbation. Started on IV steroids, antibiotics and bronchodilators. Slowly improving and not able to expectorate much sputum. Pulmonology following and appreciate recc's. Still remains very SOB and dyspneic. Ordered ECHOcardiogram to evaluate for Heart Failure and ordered CT A of Chest because patient was still dyspneic to r/o Pulmonary Embouls. Unfortunately patient went into Atrial Fibrillation with RVR but was running in 115-120's so Heparin gtt was started and Cardiology was consulted. Patient converted into NSR this AM and Palliative Care was consulted for Goals of Care and Symptom Management.   Assessment & Plan:   Principal Problem:   Acute on chronic respiratory failure (HCC) Active Problems:   Hypothyroidism   Hyperlipidemia, acquired   PULMONARY FIBROSIS ILD POST INFLAMMATORY CHRONIC   CAP (community acquired pneumonia)   Pulmonary fibrosis (HCC)   Hyperglycemia, drug-induced   COPD with exacerbation (HCC)   Atrial fibrillation (HCC)   Pancreatic mass   Hypokalemia  1. Acute on chronic respiratory failure with Hypoxia.  -Suspect this is related to pneumonia as well as COPD/pulmonary fibrosis.  -Currently on slightly higher than baseline oxygen requirement of 4 L.  -Patient likely has a component of dysphagia. Speech therapy following the patient underwent modified barium swallow. He was found to have a small Zenker's diverticulum. Does not appear to be a good candidate for repair. He is continued on a dysphagia 3 diet.. -Added Sodium Chloride 0.9% Neb Solution 3 mL Daily -C/w Chest Physiotherapy -Gave another dose of  Lasix IV 40 mg today; s/p 2 doses of IV Lasix 40 mg now -C/w Flutter Valve -Added Incentive Spirometry -Ordered CTA to rule out Pulmonary Emboulus and CTA showed no evidence of acute pulmonary thromboembolism, stable pulmonary Fibrosis, and that the confluent consolidation towards the Right Lung base has improved supporting resolution of prior inflammatory process. -Appreciate Palliative Care Consultation   2. New Onset Atrial Fibrillation with RVR s/p Conversion to NSR -C/w Telemetry -CHADS2VASc of at least 2 -Placed on Cardizem 30 mg q8h; Per Cardiology if BP's stable would consildate to 120 mg Long acting po Daily starting tomorrow -Started Heparin gtt yesterday and changed by Cardiology to Eliquis 5 mg po BID per Pharmacy; -ECHOcardiogram done and results as below -Appreciated Cardiology Consultation and Evaluation for Recc's  3. Probable Community-Acquired Pneumonia.  -S/p Treatment with Ceftriaxone and Azithromycin 10 day treatment  -Influenza panel negative. -Improved per CT Findings.   4. COPD Exacerbation.  -Continue on IV Methylprednisolone 60 mg IV q6h, Abx with po Azithromycin 500 mg po Daily and Ceftriaxone 1 gram q24h and Bronchodilators; C/w IH DuoNebs, Albuterol, and Mucomyst.  -Continue pulmonary hygiene and Chest PT -Added 0.9% Nebulizer Solution Daily  5. Interstitial lung Disease/Pulmonary Fibrosis.  -Continue Mycophenolate 1000 mg po BID and IV Methylprednisolone 60 mg IV q6h. -Pulmonology Dr. Juanetta Gosling was following and appreciate Recc's but is out of town.  -Added Chest PT and will try to continue q4h -His baseline functional status appears to be quite poor. -CXR on 05/27/16 showed Underlying chronic interstitial lung disease -CT Angio Chest PE showed stable Pulmonary Fibrosis.  -Will Consult Palliative Care Medicine Consultation as patient not progressing significantly   6. Left Upper Arm Edema and  Swelling, improved -Hand significantly swollen; Likely dependent  edema -Gave another dose of IV Lasix 40 mg  -Ultrasound of Upper Extremity showed no evidence of Left Upper Extremity DVT -Advised to prop arm up and OOB and Ambulate  7. Volume Overload ? Acute Diastolic CHF -CXR on 05/27/2016 showed Cardiomegaly with progressive bilateral from interstitial prominence noted consistent CHF. Pneumonitis cannot be excluded. Underlying chronic interstitial lung disease. Contrast in the colon. No acute bony abnormality . -Gave 2 Doses of IV Lasix  -Last ECHO showed EF of 55-60% with No Regional Wall Motion Abnormalities and showed borderline Myocardial Hypertrophy with Mild Disproportionate focal Basal Septal Hypertrophy -Repeat ECHO showed EF of 65-70% with wall motion normal; Study not sufficient to allow evaluation of LV diastolic function (likely from Atrial Fibrillation with RVR) -Daily Weights, Strict I's and O's -BNP 67.0 -Per cardiology start 40 mg po Lasix Daily -Appreciate Cardiac Evaluation and Consultation for further Reccomendations  8. Enlarging Cystic Abnormality in the Head of the Pancreas -CT PE showed Enlarging cystic abnormality in the head of the pancreas compared with 2013. Slow-growing cystic neoplasm is not excluded.  -MRI of the pancreas is recommended. -Patient is unable to lay flat at this time and does not want to pursue MRI  9. Hypokalemia -Patient's K+ was 3.2 this AM -Replete with Kcl 40 mEW po BID x 2 doses -Repeat CMP in AM  10. Hyperglycemia in the Setting of Diabetes Mellitus Type 2 and Steroids   -Felt to be related to Steroids.  -Hemoglobin A1c 7.6.  -On Resistant Novolog Sliding Scale Insulin and Lantus 10 units sq daily increased to 12 units qHS.  -CBG's have ranged from 191-363 -Appreciate Consult Diabetes Education Coordinator for assistance -Will increase Novolog 4 units TID wm to 6 units TID wm per Diabetic Coordinator Recc's -Continue to Monitor CBG's  11. Hypothyroidism. -TSH on 04/30/16 was 1.720 -Continue  Synthroid 12.5 mcg po MWF and Levothyroxine 125 mcg po Daily   12. Hyperlipidemia.  -Continue Atorvastatin 10 mg po Daily  13. BPH -C/w Terazosin 10 mg po qHS  14. CAD -CT Angio Chest PE showed some evidence of Coronary Calcification  -C/w ASA 325 mg po Daily and Atorvastatin 10 mg po Daily  15. Constipation, improving -Bisacodyl 10 mg Suppository x1 yesterday -C/w Senna-Docusate 1 tab po BID and Miralax 17 g po BID -If still not improving will order another Enema  16. Leukocytosis -Likely Reactive from Steroid Use; Patient's WBC now 14.9 -> 13.8 -> 15.6 -Continue to Monitor and repeat CBC in AM  DVT prophylaxis: Lovenox 40 mg sq q24h Code Status: Partial Code; No CPR, No Defibrillation  Family Communication: Discussed with Wife at Bedside Disposition Plan: Home Health PT when stable with Rollator Walker  Consultants:   Pulmonology  Cardiology  Palliative Care Medicine   Procedures:  Left Upper Arm Venous Ultrasound - No evidence of left upper extremity deep venous thrombosis.  ECHOCARDIOGRAM- Study Conclusions  - Left ventricle: The cavity size was normal. Wall thickness was   increased in a pattern of mild LVH. Systolic function was   vigorous. The estimated ejection fraction was in the range of 65%   to 70%. Wall motion was normal; there were no regional wall   motion abnormalities. The study is not technically sufficient to   allow evaluation of LV diastolic function. - Aortic valve: Mildly calcified annulus. Trileaflet; mildly   thickened leaflets. Valve area (VTI): 3.05 cm^2. Valve area   (Vmax): 3.2 cm^2. Valve area (Vmean): 2.98  cm^2. - Technically adequate study.   Antimicrobials:  Anti-infectives    Start     Dose/Rate Route Frequency Ordered Stop   05/24/16 1000  azithromycin (ZITHROMAX) tablet 500 mg  Status:  Discontinued     500 mg Oral Daily 05/23/16 1113 05/29/16 1826   05/21/16 1030  azithromycin (ZITHROMAX) 500 mg in dextrose 5 % 250 mL  IVPB  Status:  Discontinued     500 mg 250 mL/hr over 60 Minutes Intravenous Every 24 hours 05/20/16 1410 05/23/16 1113   05/21/16 1000  cefTRIAXone (ROCEPHIN) 1 g in dextrose 5 % 50 mL IVPB  Status:  Discontinued     1 g 100 mL/hr over 30 Minutes Intravenous Every 24 hours 05/20/16 1410 05/29/16 1826   05/20/16 1000  cefTRIAXone (ROCEPHIN) 1 g in dextrose 5 % 50 mL IVPB     1 g 100 mL/hr over 30 Minutes Intravenous  Once 05/20/16 0945 05/20/16 1053   05/20/16 1000  azithromycin (ZITHROMAX) 500 mg in dextrose 5 % 250 mL IVPB     500 mg 250 mL/hr over 60 Minutes Intravenous  Once 05/20/16 0945 05/20/16 1201     Subjective: Seen and examined at bedside this AM and stated his breathing status was still bad and felt worse. No nausea or vomiting and stated he feels palpitations intermittently. Patient converted to NSR. Discussed with patient about further workup for Pancreatic mass and he declined at this point and just wants to breathe better. No other concerns or complaints at this time.   Objective: Vitals:   06/01/16 0503 06/01/16 0803 06/01/16 0815 06/01/16 1157  BP: 110/69     Pulse: 95     Resp: 20     Temp: 98 F (36.7 C)     TempSrc: Oral     SpO2: 94% 90% 91% 98%  Weight: 75.3 kg (166 lb)     Height:        Intake/Output Summary (Last 24 hours) at 06/01/16 1414 Last data filed at 06/01/16 0900  Gross per 24 hour  Intake              540 ml  Output              500 ml  Net               40 ml   Filed Weights   05/30/16 1700 05/31/16 2013 06/01/16 0503  Weight: 75.5 kg (166 lb 6.4 oz) 75.5 kg (166 lb 8 oz) 75.3 kg (166 lb)   Examination: Physical Exam:  Constitutional: NAD and appears calm and dyspenic and appears uncomfortable breathing wise Eyes:  Lids and conjunctivae normal, sclerae anicteric  ENMT: External Ears, Nose appear normal. Grossly normal hearing. Neck: Appears normal, supple, no cervical masses, normal ROM, no appreciable thyromegaly Respiratory:  Diminished to auscultation bilaterally with expiratory wheezing and rhodochrous breath sounds. Increased respiratory effort and patient is having some belly breathing. No accessory muscle use but wearing O2 via [place].  Cardiovascular: RRR, no murmurs / rubs / gallops. S1 and S2 auscultated. No appreciable lower extremity edema today.   Abdomen: Soft, non-tender, non-distended. No masses palpated. No appreciable hepatosplenomegaly. Bowel sounds positive x4.  GU: Deferred. Musculoskeletal: No clubbing / cyanosis of digits/nails. No joint deformity upper and lower extremities. Normal strength and muscle tone.  Skin: No rashes, lesions, ulcers on limited skin evaluation. No induration; Warm and dry Has erythema on Upper extremities.  Neurologic: CN 2-12 grossly intact with no focal  deficits. Romberg sign cerebellar reflexes not assessed.  Psychiatric: Normal judgment and insight. Alert and oriented x 3. Normal mood and appropriate affect.   Data Reviewed: I have personally reviewed following labs and imaging studies  CBC:  Recent Labs Lab 05/28/16 0455 05/29/16 0647 05/30/16 0609 05/31/16 0606 06/01/16 0446  WBC 12.7* 11.6* 14.9* 13.8* 15.6*  NEUTROABS 11.9* 10.9* 14.2* 13.0* 14.9*  HGB 11.8* 11.5* 12.0* 11.9* 11.7*  HCT 37.8* 36.8* 38.4* 38.1* 37.9*  MCV 96.2 97.1 97.0 97.2 96.9  PLT 197 198 182 185 169   Basic Metabolic Panel:  Recent Labs Lab 05/28/16 0455 05/29/16 0647 05/30/16 0609 05/31/16 0606 06/01/16 0446  NA 137 137 141 141 138  K 4.4 4.0 4.1 4.0 3.2*  CL 93* 93* 91* 90* 87*  CO2 38* 39* 42* 44* 41*  GLUCOSE 157* 200* 198* 177* 204*  BUN 29* 28* 30* 34* 36*  CREATININE 0.55* 0.57* 0.60* 0.64 0.57*  CALCIUM 8.7* 8.8* 9.0 9.2 9.0  MG 2.0 1.9 1.8 1.9 1.9  PHOS 3.2 3.3 3.3 3.2 3.0   GFR: Estimated Creatinine Clearance: 60.9 mL/min (by C-G formula based on SCr of 0.57 mg/dL (L)). Liver Function Tests:  Recent Labs Lab 05/28/16 0455 05/29/16 0647 05/30/16 0609  05/31/16 0606 06/01/16 0446  AST 36 32 37 37 38  ALT 43 47 50 49 51  ALKPHOS 36* 40 43 41 43  BILITOT 1.0 0.8 0.8 0.7 0.9  PROT 5.3* 5.1* 5.4* 5.3* 5.1*  ALBUMIN 2.8* 2.9* 3.1* 2.9* 2.8*   No results for input(s): LIPASE, AMYLASE in the last 168 hours. No results for input(s): AMMONIA in the last 168 hours. Coagulation Profile:  Recent Labs Lab 05/31/16 1406  INR 0.93   Cardiac Enzymes: No results for input(s): CKTOTAL, CKMB, CKMBINDEX, TROPONINI in the last 168 hours. BNP (last 3 results) No results for input(s): PROBNP in the last 8760 hours. HbA1C: No results for input(s): HGBA1C in the last 72 hours. CBG:  Recent Labs Lab 05/31/16 1619 05/31/16 2232 06/01/16 0032 06/01/16 0758 06/01/16 1137  GLUCAP 131* 233* 191* 256* 363*   Lipid Profile: No results for input(s): CHOL, HDL, LDLCALC, TRIG, CHOLHDL, LDLDIRECT in the last 72 hours. Thyroid Function Tests: No results for input(s): TSH, T4TOTAL, FREET4, T3FREE, THYROIDAB in the last 72 hours. Anemia Panel: No results for input(s): VITAMINB12, FOLATE, FERRITIN, TIBC, IRON, RETICCTPCT in the last 72 hours. Sepsis Labs: No results for input(s): PROCALCITON, LATICACIDVEN in the last 168 hours.  No results found for this or any previous visit (from the past 240 hour(s)).  Radiology Studies: Ct Angio Chest Pe W Or Wo Contrast  Result Date: 05/31/2016 CLINICAL DATA:  Chronic short of breath. History of interstitial lung disease. EXAM: CT ANGIOGRAPHY CHEST WITH CONTRAST TECHNIQUE: Multidetector CT imaging of the chest was performed using the standard protocol during bolus administration of intravenous contrast. Multiplanar CT image reconstructions and MIPs were obtained to evaluate the vascular anatomy. CONTRAST:  100 cc Isovue 370 COMPARISON:  07/21/2015 FINDINGS: Cardiovascular: There are no filling defects in the pulmonary arterial tree to suggest acute pulmonary thromboembolism. Scattered atherosclerotic calcifications in  the aortic arch, great vessels, and coronary arteries. Mediastinum/Nodes: Prominent mediastinal fat. No abnormal mediastinal adenopathy. Thyroid is not visualized. Lungs/Pleura: Lungs are very under aerated. Fibrotic changes throughout the right lung are not significantly changed. Confluent consolidation seen on the prior study has largely resolved. Some minimal confluent opacities towards the right lung base likely reflect volume loss. Similar areas of sized segmental  atelectasis at the left base. Minimal of fibrotic changes in the peripheral left upper lobe. Upper Abdomen: Postcholecystectomy. There is a cystic lesion in the pancreatic head measuring 2.8 cm. It is stable compared with 07/21/2015 but enlarged when compared to the chest CT dated 03/27/2012. Today it measures up to 2.8 cm. Musculoskeletal: No vertebral compression deformity. Review of the MIP images confirms the above findings. IMPRESSION: No evidence of acute pulmonary thromboembolism Stable pulmonary fibrosis Confluent consolidation towards the right lung base has improved supporting resolution of a prior inflammatory process. Residual confluent densities likely will flank volume loss Enlarging cystic abnormality in the head of the pancreas compared with 2013. Slow-growing cystic neoplasm is not excluded. MRI of the pancreas is recommended. Electronically Signed   By: Jolaine Click M.D.   On: 05/31/2016 15:43   Scheduled Meds: . acetylcysteine  3 mL Nebulization TID  . apixaban  5 mg Oral BID  . aspirin EC  325 mg Oral Daily  . atorvastatin  10 mg Oral Daily  . bisacodyl  10 mg Rectal Once  . bisacodyl  10 mg Rectal Once  . citalopram  20 mg Oral Daily  . diltiazem  30 mg Oral Q8H  . furosemide  40 mg Intravenous Once  . furosemide  40 mg Oral Daily  . guaiFENesin  1,200 mg Oral BID  . insulin aspart  0-20 Units Subcutaneous TID WC  . insulin aspart  0-5 Units Subcutaneous QHS  . insulin aspart  6 Units Subcutaneous TID WC  . [START  ON 06/02/2016] insulin glargine  12 Units Subcutaneous Daily  . levalbuterol  1.25 mg Nebulization Q4H WA   And  . ipratropium  0.5 mg Nebulization Q4H WA  . levothyroxine  12.5 mcg Oral Once per day on Mon Wed Fri  . levothyroxine  125 mcg Oral QAC breakfast  . methylPREDNISolone (SOLU-MEDROL) injection  60 mg Intravenous Q6H  . mycophenolate  1,000 mg Oral BID  . pantoprazole  40 mg Oral Daily  . polyethylene glycol  17 g Oral BID  . potassium chloride  40 mEq Oral BID  . senna-docusate  1 tablet Oral BID  . sodium chloride  3 mL Nebulization Daily  . terazosin  10 mg Oral QHS   Continuous Infusions: . sodium chloride      LOS: 12 days   Merlene Laughter, DO [facility] Pager [phone]  If 7PM-7AM, please contact night-coverage [email or web address] Password [email or web address] 06/01/2016, 2:14 PM

## 2016-06-01 NOTE — Progress Notes (Addendum)
Inpatient Diabetes Program Recommendations  AACE/ADA: New Consensus Statement on Inpatient Glycemic Control (2015)  Target Ranges:  Prepandial:   less than 140 mg/dL      Peak postprandial:   less than 180 mg/dL (1-2 hours)      Critically ill patients:  140 - 180 mg/dL  Results for Raymond [name], Raymond [name] (MRN [id number]) as of 06/01/2016 07:57  Ref. Range 06/01/2016 04:46  Glucose Latest Ref Range: 65 - 99 mg/dL 409204 ([name])   Results for Raymond [name], Raymond [name] (MRN [id number]) as of 06/01/2016 07:57  Ref. Range 05/31/2016 07:32 05/31/2016 11:23 05/31/2016 16:19 05/31/2016 22:32 06/01/2016 00:32  Glucose-Capillary Latest Ref Range: 65 - 99 mg/dL 956157 ([name]) 213342 ([name]) 086131 ([name]) 233 ([name]) 191 ([name])   Review of Glycemic Control  Current orders for Inpatient glycemic control: Novolog 0-20 units TID with meals, Novolog 0-5 units QHS, Lantus 10 units daily, Novolog 4 units TID with meals for meal coverage   Inpatient Diabetes Program Recommendations: Insulin - Basal: If steroids are continued as ordered, please consider increasing Lantus to 12 units QHS. Insulin - Meal Coverage: Noted Novolog 4 units TID with meals was added on 05/31/16 for meal coverage. If steroids are continued and post prandial glucose is consistently greater than 180 mg/dl, may want to consider increasing meal coverage to Novolog 6 units TID with meals.  Thanks, Orlando PennerMarie Ludean Duhart, RN, MSN, CDE Diabetes Coordinator Inpatient Diabetes Program [phone] (Team Pager from 8am to 5pm)

## 2016-06-01 NOTE — Progress Notes (Addendum)
ANTICOAGULATION CONSULT NOTE  Pharmacy Consult for Heparin Indication: atrial fibrillation  Allergies  Allergen Reactions  . Codeine Other (See Comments)    hallucinations  . Levaquin [Levofloxacin In D5w] Other (See Comments)    Tendonitis    Patient Measurements: Height: 5\' 6"  (167.6 cm) Weight: 166 lb (75.3 kg) IBW/kg (Calculated) : 63.8 HEPARIN DW (KG): 75.2  Vital Signs: Temp: 98 F (36.7 C) (03/06 0503) Temp Source: Oral (03/06 0503) BP: 110/69 (03/06 0503) Pulse Rate: 95 (03/06 0503)  Labs:  Recent Labs  05/30/16 0609 05/31/16 0606 05/31/16 1406 05/31/16 2138 06/01/16 0446  HGB 12.0* 11.9*  --   --  11.7*  HCT 38.4* 38.1*  --   --  37.9*  PLT 182 185  --   --  169  APTT  --   --  24  --   --   LABPROT  --   --  12.5  --   --   INR  --   --  0.93  --   --   HEPARINUNFRC  --   --   --  0.77* 0.91*  CREATININE 0.60* 0.64  --   --  0.57*    Estimated Creatinine Clearance: 60.9 mL/min (by C-G formula based on SCr of 0.57 mg/dL (L)).   Medical History: Past Medical History:  Diagnosis Date  . Achilles tendon rupture   . Arthritis   . BPH (benign prostatic hyperplasia)   . CAD (coronary artery disease)   . Cataracts, bilateral   . COPD (chronic obstructive pulmonary disease) (HCC)   . GERD (gastroesophageal reflux disease)   . Hyperlipemia   . Hypothyroid   . ILD (interstitial lung disease) (HCC)   . Prostate hyperplasia, benign localized, with urinary obstruction     Medications:  Prescriptions Prior to Admission  Medication Sig Dispense Refill Last Dose  . albuterol (PROVENTIL) (2.5 MG/3ML) 0.083% nebulizer solution Take 3 mLs (2.5 mg total) by nebulization every 6 (six) hours as needed for wheezing or shortness of breath. 75 mL 12 05/19/2016 at Unknown time  . amoxicillin-clavulanate (AUGMENTIN) 875-125 MG tablet Take 1 tablet by mouth 2 (two) times daily. 60 tablet 1 05/19/2016 at Unknown time  . aspirin EC 325 MG tablet Take 325 mg by mouth  daily.   05/20/2016 at Unknown time  . atorvastatin (LIPITOR) 10 MG tablet Take 1 tablet (10 mg total) by mouth daily. 90 tablet 3 05/19/2016 at Unknown time  . Cholecalciferol (VITAMIN D) 2000 UNITS CAPS Take 2,000-4,000 Units by mouth daily. Takes 2000 units everyday except for Fri, Sat, and Sun patient takes 4000 units.   05/20/2016 at Unknown time  . citalopram (CELEXA) 20 MG tablet Take 1 tablet (20 mg total) by mouth daily. 90 tablet 3 05/20/2016 at Unknown time  . levothyroxine (SYNTHROID, LEVOTHROID) 125 MCG tablet Take 1 tablet (125 mcg total) by mouth daily.   05/20/2016 at Unknown time  . levothyroxine (SYNTHROID, LEVOTHROID) 25 MCG tablet Take 12.5 mcg by mouth. Take Monday, Wednesday and Friday with the 125mcg   05/20/2016 at Unknown time  . mycophenolate (CELLCEPT) 500 MG tablet Take 2 tablets by mouth 2 (two) times daily.   05/20/2016 at Unknown time  . omeprazole (PRILOSEC) 20 MG capsule Take 1 capsule (20 mg total) by mouth every evening. 90 capsule 3 05/19/2016 at Unknown time  . predniSONE (DELTASONE) 10 MG tablet Take 15 mg by mouth.    05/19/2016 at Unknown time  . terazosin (HYTRIN) 10 MG capsule  Take 1 capsule (10 mg total) by mouth at bedtime. 90 capsule 3 05/19/2016 at Unknown time    Assessment: 83 yo male with new onset atrial fibrillation. Admitted for acute on chronic respiratory failure with hypoxia. Pharmacy dosing heparin for anticoagulation.  No bleeding noted.   H/H stable, slight drop in PLTC.  Heparin level remains above goal  Goal of Therapy:  Heparin level 0.3-0.7 units/ml  Monitor platelets by anticoagulation protocol: Yes   Plan:  Hold Heparin drip x 45 min Reduce Heparin to 750 units/hr Check anti-xa level in 6-8 hours.  Check anti-Xa level daily while on heparin Continue to monitor H&H and platelets  Mady Gemma, [facility]  06/01/2016,7:47 AM   Addum:  Change to eliquis, 5 mg po bid.  Talbert Cage, PharmD

## 2016-06-02 DIAGNOSIS — R0603 Acute respiratory distress: Secondary | ICD-10-CM

## 2016-06-02 DIAGNOSIS — R0600 Dyspnea, unspecified: Secondary | ICD-10-CM

## 2016-06-02 DIAGNOSIS — I4891 Unspecified atrial fibrillation: Secondary | ICD-10-CM

## 2016-06-02 DIAGNOSIS — K869 Disease of pancreas, unspecified: Secondary | ICD-10-CM

## 2016-06-02 DIAGNOSIS — Z7189 Other specified counseling: Secondary | ICD-10-CM

## 2016-06-02 LAB — CBC WITH DIFFERENTIAL/PLATELET
Basophils Absolute: 0 10*3/uL (ref 0.0–0.1)
Basophils Relative: 0 %
Eosinophils Absolute: 0 10*3/uL (ref 0.0–0.7)
Eosinophils Relative: 0 %
HEMATOCRIT: 37.7 % — AB (ref 39.0–52.0)
HEMOGLOBIN: 11.5 g/dL — AB (ref 13.0–17.0)
LYMPHS ABS: 0.2 10*3/uL — AB (ref 0.7–4.0)
LYMPHS PCT: 2 %
MCH: 29.7 pg (ref 26.0–34.0)
MCHC: 30.5 g/dL (ref 30.0–36.0)
MCV: 97.4 fL (ref 78.0–100.0)
Monocytes Absolute: 0.5 10*3/uL (ref 0.1–1.0)
Monocytes Relative: 3 %
Neutro Abs: 15.2 10*3/uL — ABNORMAL HIGH (ref 1.7–7.7)
Neutrophils Relative %: 95 %
Platelets: 164 10*3/uL (ref 150–400)
RBC: 3.87 MIL/uL — AB (ref 4.22–5.81)
RDW: 13.5 % (ref 11.5–15.5)
WBC: 16 10*3/uL — AB (ref 4.0–10.5)

## 2016-06-02 LAB — PHOSPHORUS: Phosphorus: 3.5 mg/dL (ref 2.5–4.6)

## 2016-06-02 LAB — COMPREHENSIVE METABOLIC PANEL
ALBUMIN: 2.9 g/dL — AB (ref 3.5–5.0)
ALK PHOS: 45 U/L (ref 38–126)
ALT: 60 U/L (ref 17–63)
ANION GAP: 10 (ref 5–15)
AST: 45 U/L — ABNORMAL HIGH (ref 15–41)
BUN: 40 mg/dL — ABNORMAL HIGH (ref 6–20)
CO2: 37 mmol/L — ABNORMAL HIGH (ref 22–32)
Calcium: 8.9 mg/dL (ref 8.9–10.3)
Chloride: 91 mmol/L — ABNORMAL LOW (ref 101–111)
Creatinine, Ser: 0.68 mg/dL (ref 0.61–1.24)
GFR calc Af Amer: >60 mL/min (ref >60)
GFR calc non Af Amer: >60 mL/min (ref >60)
GLUCOSE: 212 mg/dL — AB (ref 65–99)
POTASSIUM: 4.7 mmol/L (ref 3.5–5.1)
Sodium: 138 mmol/L (ref 135–145)
Total Bilirubin: 1.1 mg/dL (ref 0.3–1.2)
Total Protein: 5.3 g/dL — ABNORMAL LOW (ref 6.5–8.1)

## 2016-06-02 LAB — GLUCOSE, CAPILLARY
GLUCOSE-CAPILLARY: 119 mg/dL — AB (ref 65–99)
GLUCOSE-CAPILLARY: 78 mg/dL (ref 65–99)
Glucose-Capillary: 271 mg/dL — ABNORMAL HIGH (ref 65–99)
Glucose-Capillary: 218 mg/dL — ABNORMAL HIGH (ref 65–99)

## 2016-06-02 LAB — MAGNESIUM: Magnesium: 2 mg/dL (ref 1.7–2.4)

## 2016-06-02 LAB — TSH: TSH: 0.361 u[IU]/mL (ref 0.350–4.500)

## 2016-06-02 MED ORDER — MORPHINE SULFATE (CONCENTRATE) 10 MG/0.5ML PO SOLN
2.5000 mg | ORAL | Status: DC | PRN
  Administered 2016-06-02 – 2016-06-03 (×4): 2.6 mg via ORAL
  Filled 2016-06-02 (×4): qty 0.5

## 2016-06-02 MED ORDER — SODIUM CHLORIDE 0.9 % IN NEBU
INHALATION_SOLUTION | RESPIRATORY_TRACT | Status: AC
  Filled 2016-06-02: qty 3

## 2016-06-02 MED ORDER — DILTIAZEM HCL ER COATED BEADS 120 MG PO CP24
120.0000 mg | ORAL_CAPSULE | Freq: Every day | ORAL | Status: DC
  Administered 2016-06-02 – 2016-06-03 (×2): 120 mg via ORAL
  Filled 2016-06-02: qty 1

## 2016-06-02 NOTE — Progress Notes (Signed)
Telemetry reviewed, patient remains in SR. No recurrence of afib. Will change dilt to long acting 120mg  daily. Started on eliquis for stroke prevention yesterday. Given his severe respiratory issues some sinus tachycardia is expected. Please f/u TSH that was added. Follow renal function of lasix 40mg , may need dose adjustment over time.  We will sign off inpatient care, call with questions.    Dominga FerryJ Joshau Code MD

## 2016-06-02 NOTE — Discharge Instructions (Signed)

## 2016-06-02 NOTE — Progress Notes (Signed)
PROGRESS NOTE    Raymond [name]  ZOX:[id number] DOB: 1929/10/23 DOA: 05/20/2016 PCP: Rudi Heap, MD   Brief Narrative:  83 year old male with a history of chronic respiratory failure on 4 L of oxygen, COPD, interstitial lung disease/pulmonary fibrosis, came to the hospital with worsening shortness of breath and found to have possible pneumonia, COPD exacerbation. Started on IV steroids, antibiotics and bronchodilators. Slowly improving and not able to expectorate much sputum. Pulmonology following and appreciate recc's. Still remains very SOB and dyspneic. Ordered ECHOcardiogram to evaluate for Heart Failure and ordered CT A of Chest because patient was still dyspneic to r/o Pulmonary Embouls. Unfortunately patient went into Atrial Fibrillation with RVR but was running in 115-120's so Heparin gtt was started and Cardiology was consulted. Patient converted into NSR this AM and Palliative Care was consulted for Goals of Care and Symptom Management.   Assessment & Plan:   Principal Problem:   Acute on chronic respiratory failure (HCC) Active Problems:   Hypothyroidism   Hyperlipidemia, acquired   PULMONARY FIBROSIS ILD POST INFLAMMATORY CHRONIC   CAP (community acquired pneumonia)   Pulmonary fibrosis (HCC)   Hyperglycemia, drug-induced   COPD with exacerbation (HCC)   Atrial fibrillation (HCC)   Pancreatic mass   Hypokalemia   Shortness of breath   Goals of care, counseling/discussion   Palliative care encounter   Acute on chronic respiratory failure with Hypoxia.  -Suspect this is related to pneumonia as well as COPD/pulmonary fibrosis.  -Currently on slightly higher than baseline oxygen requirement of 4 L.  -Patient likely has a component of dysphagia. Speech therapy following the patient underwent modified barium swallow. He was found to have a small Zenker's diverticulum. Not a good candidate for repair. He is continued on a dysphagia 3 diet.. -Added Sodium Chloride 0.9%  Neb Solution 3 mL Daily -C/w Chest Physiotherapy - 40mg  lasix daily -C/w Flutter Valve -Added Incentive Spirometry -Ordered CTA to rule out Pulmonary Emboulus and CTA showed no evidence of acute pulmonary thromboembolism, stable pulmonary Fibrosis, and that the confluent consolidation towards the Right Lung base has improved supporting resolution of prior inflammatory process. -Appreciate Palliative Care Consultation  - Patient, wife and TRH discussed goals of care today. Patient understands that he is doing poorly and he has many medical problems.  He does not want to continue to get bloodwork and tests done because he is tired and wants to rest.  Discussed with wife and patient that, as patient has expressed these wishes, he would likely be appropriate for hospice.   New Onset Atrial Fibrillation with RVR s/p Conversion to NSR - d/c telemetry -CHADS2VASc of at least 2 - 120 mg Long acting Diltiazem po Daily - Eliquis 5 mg po BID per Pharmacy; -ECHOcardiogram done and results as below -Appreciated Cardiology Consultation and Evaluation for Recc's - patient in NSR today  Probable Community-Acquired Pneumonia.  -S/p Treatment with Ceftriaxone and Azithromycin 10 day treatment  -Influenza panel negative. -Improved per CT Findings  COPD Exacerbation.  -Continue on IV Methylprednisolone 60 mg IV q6h, Abx with po Azithromycin 500 mg po Daily and Ceftriaxone 1 gram q24h and Bronchodilators; C/w IH DuoNebs, Albuterol, and Mucomyst.  -Continue pulmonary hygiene and Chest PT -Added 0.9% Nebulizer Solution Daily - will transition to PO prednisone  Interstitial lung Disease/Pulmonary Fibrosis.  -Continue Mycophenolate 1000 mg po BID and IV Methylprednisolone 60 mg IV q6h. -Pulmonology Dr. Juanetta Gosling was following and appreciate Recc's but is out of town.  -Added Chest PT and will try to  continue q4h -His baseline functional status appears to be quite poor. -CXR on 05/27/16 showed Underlying  chronic interstitial lung disease -CT Angio Chest PE showed stable Pulmonary Fibrosis.  - Palliative Care Medicine Consultation as patient not progressing significantly   Left Upper Arm Edema and Swelling, improved -Hand significantly swollen; Likely dependent edema -Ultrasound of Upper Extremity showed no evidence of Left Upper Extremity DVT -Advised to prop arm up and OOB and Ambulate as tolerated  Volume Overload ? Acute Diastolic CHF -CXR on 05/27/2016 showed Cardiomegaly with progressive bilateral from interstitial prominence noted consistent CHF. Pneumonitis cannot be excluded. Underlying chronic interstitial lung disease. Contrast in the colon. No acute bony abnormality . -Last ECHO showed EF of 55-60% with No Regional Wall Motion Abnormalities and showed borderline Myocardial Hypertrophy with Mild Disproportionate focal Basal Septal Hypertrophy -Repeat ECHO showed EF of 65-70% with wall motion normal; Study not sufficient to allow evaluation of LV diastolic function (likely from Atrial Fibrillation with RVR) -Per cardiology start 40 mg po Lasix Daily - patient to transition to hospice at discharge  Enlarging Cystic Abnormality in the Head of the Pancreas -CT PE showed Enlarging cystic abnormality in the head of the pancreas compared with 2013. Slow-growing cystic neoplasm is not excluded.  -MRI of the pancreas is recommended. -Patient is unable to lay flat at this time and does not want to pursue MRI - likely patient would not be a candidate for intervention even if he were to want to pursue investigation given significant pulmonary disease  Hypokalemia -K of 4.7 this am  Hyperglycemia in the Setting of Diabetes Mellitus Type 2 and Steroids   -Felt to be related to Steroids.  -Hemoglobin A1c 7.6.  -On Resistant Novolog Sliding Scale Insulin and Lantus 10 units sq daily increased to 12 units qHS.  -CBG's have ranged from 191-363 -Appreciate Consult Diabetes Education  Coordinator for assistance -Will increase Novolog 4 units TID wm to 6 units TID wm per Diabetic Coordinator Recc's -Continue to Monitor CBG's  BPH -C/w Terazosin 10 mg po qHS  CAD -CT Angio Chest PE showed some evidence of Coronary Calcification  -C/w ASA 325 mg po Daily and Atorvastatin 10 mg po Daily  Constipation, improving -Bisacodyl 10 mg Suppository x1 yesterday -C/w Senna-Docusate 1 tab po BID and Miralax 17 g po BID  Leukocytosis -Likely Reactive from Steroid Use; Patient's WBC now 14.9 -> 13.8 -> 15.6 - patient asking for no blood draws   DVT prophylaxis: Lovenox Code Status: DNR- lengthy discussion with patient and wife today.  He understands his respiratory status is guarded.  Patient states he would NOT want to be put on a ventilator as he would not want his wife to have to make decisions as to taking him off. Family Communication: wife is bedside Disposition Plan: discharge to home with hospice services in am   Consultants:   Cardiology  Pulmonology  Palliative Care  CM  Procedures:   None  Antimicrobials:   Azithromycin  Ceftriaxone    Subjective: Patient seen and evaluated.  He is working hard to breath.  Mentions he is tired.  He felt better when he was asleep and didn't feel so short of breath.  Code status discussion.  Wife present.  Patient does not want to pursue further interventions and voices he doesn't want a further blood draws and he would like to be a DNI.  Objective: Vitals:   06/01/16 2326 06/02/16 0340 06/02/16 0536 06/02/16 0830  BP:   (!) 128/55  Pulse:   85   Resp:   20   Temp:   98.7 F (37.1 C)   TempSrc:   Oral   SpO2: 94% 95% 94% 94%  Weight:      Height:        Intake/Output Summary (Last 24 hours) at 06/02/16 1146 Last data filed at 06/02/16 0754  Gross per 24 hour  Intake              480 ml  Output              850 ml  Net             -370 ml   Filed Weights   05/30/16 1700 05/31/16 2013 06/01/16  0503  Weight: 75.5 kg (166 lb 6.4 oz) 75.5 kg (166 lb 8 oz) 75.3 kg (166 lb)    Examination:  General exam: moderate distress  Respiratory system: significantly increased work of breathing, rales throughout most lung fields, poor air movement. Cardiovascular system: S1 & S2 heard, RRR. No JVD, murmurs, rubs, gallops or clicks. No pedal edema. Gastrointestinal system: Abdomen is nondistended, soft and nontender. No organomegaly or masses felt. Normal bowel sounds heard. Central nervous system: Alert and oriented. No focal neurological deficits. Extremities: weak 2/2 breathlessness. Skin: numerous ecchymoses present on upper and lower extremities bilaterally Psychiatry: Judgement and insight appear normal. Mood & affect appropriate.     Data Reviewed: I have personally reviewed following labs and imaging studies  CBC:  Recent Labs Lab 05/29/16 0647 05/30/16 0609 05/31/16 0606 06/01/16 0446 06/02/16 0623  WBC 11.6* 14.9* 13.8* 15.6* 16.0*  NEUTROABS 10.9* 14.2* 13.0* 14.9* 15.2*  HGB 11.5* 12.0* 11.9* 11.7* 11.5*  HCT 36.8* 38.4* 38.1* 37.9* 37.7*  MCV 97.1 97.0 97.2 96.9 97.4  PLT 198 182 185 169 164   Basic Metabolic Panel:  Recent Labs Lab 05/29/16 0647 05/30/16 0609 05/31/16 0606 06/01/16 0446 06/02/16 0449  NA 137 141 141 138 138  K 4.0 4.1 4.0 3.2* 4.7  CL 93* 91* 90* 87* 91*  CO2 39* 42* 44* 41* 37*  GLUCOSE 200* 198* 177* 204* 212*  BUN 28* 30* 34* 36* 40*  CREATININE 0.57* 0.60* 0.64 0.57* 0.68  CALCIUM 8.8* 9.0 9.2 9.0 8.9  MG 1.9 1.8 1.9 1.9 2.0  PHOS 3.3 3.3 3.2 3.0 3.5   GFR: Estimated Creatinine Clearance: 60.9 mL/min (by C-G formula based on SCr of 0.68 mg/dL). Liver Function Tests:  Recent Labs Lab 05/29/16 0647 05/30/16 0609 05/31/16 0606 06/01/16 0446 06/02/16 0449  AST 32 37 37 38 45*  ALT 47 50 49 51 60  ALKPHOS 40 43 41 43 45  BILITOT 0.8 0.8 0.7 0.9 1.1  PROT 5.1* 5.4* 5.3* 5.1* 5.3*  ALBUMIN 2.9* 3.1* 2.9* 2.8* 2.9*   No  results for input(s): LIPASE, AMYLASE in the last 168 hours. No results for input(s): AMMONIA in the last 168 hours. Coagulation Profile:  Recent Labs Lab 05/31/16 1406  INR 0.93   Cardiac Enzymes: No results for input(s): CKTOTAL, CKMB, CKMBINDEX, TROPONINI in the last 168 hours. BNP (last 3 results) No results for input(s): PROBNP in the last 8760 hours. HbA1C: No results for input(s): HGBA1C in the last 72 hours. CBG:  Recent Labs Lab 06/01/16 0758 06/01/16 1137 06/01/16 1638 06/01/16 2154 06/02/16 0749  GLUCAP 256* 363* 83 249* 271*   Lipid Profile: No results for input(s): CHOL, HDL, LDLCALC, TRIG, CHOLHDL, LDLDIRECT in the last 72 hours. Thyroid Function Tests:  Recent Labs  05/31/16 1406  TSH 0.361   Anemia Panel: No results for input(s): VITAMINB12, FOLATE, FERRITIN, TIBC, IRON, RETICCTPCT in the last 72 hours. Sepsis Labs: No results for input(s): PROCALCITON, LATICACIDVEN in the last 168 hours.  No results found for this or any previous visit (from the past 240 hour(s)).       Radiology Studies: Ct Angio Chest Pe W Or Wo Contrast  Result Date: 05/31/2016 CLINICAL DATA:  Chronic short of breath. History of interstitial lung disease. EXAM: CT ANGIOGRAPHY CHEST WITH CONTRAST TECHNIQUE: Multidetector CT imaging of the chest was performed using the standard protocol during bolus administration of intravenous contrast. Multiplanar CT image reconstructions and MIPs were obtained to evaluate the vascular anatomy. CONTRAST:  100 cc Isovue 370 COMPARISON:  07/21/2015 FINDINGS: Cardiovascular: There are no filling defects in the pulmonary arterial tree to suggest acute pulmonary thromboembolism. Scattered atherosclerotic calcifications in the aortic arch, great vessels, and coronary arteries. Mediastinum/Nodes: Prominent mediastinal fat. No abnormal mediastinal adenopathy. Thyroid is not visualized. Lungs/Pleura: Lungs are very under aerated. Fibrotic changes throughout  the right lung are not significantly changed. Confluent consolidation seen on the prior study has largely resolved. Some minimal confluent opacities towards the right lung base likely reflect volume loss. Similar areas of sized segmental atelectasis at the left base. Minimal of fibrotic changes in the peripheral left upper lobe. Upper Abdomen: Postcholecystectomy. There is a cystic lesion in the pancreatic head measuring 2.8 cm. It is stable compared with 07/21/2015 but enlarged when compared to the chest CT dated 03/27/2012. Today it measures up to 2.8 cm. Musculoskeletal: No vertebral compression deformity. Review of the MIP images confirms the above findings. IMPRESSION: No evidence of acute pulmonary thromboembolism Stable pulmonary fibrosis Confluent consolidation towards the right lung base has improved supporting resolution of a prior inflammatory process. Residual confluent densities likely will flank volume loss Enlarging cystic abnormality in the head of the pancreas compared with 2013. Slow-growing cystic neoplasm is not excluded. MRI of the pancreas is recommended. Electronically Signed   By: Jolaine ClickArthur  Hoss M.D.   On: 05/31/2016 15:43        Scheduled Meds: . acetylcysteine  3 mL Nebulization TID  . apixaban  5 mg Oral BID  . aspirin EC  325 mg Oral Daily  . atorvastatin  10 mg Oral Daily  . bisacodyl  10 mg Rectal Once  . bisacodyl  10 mg Rectal Once  . citalopram  20 mg Oral Daily  . diltiazem  120 mg Oral Daily  . furosemide  40 mg Intravenous Once  . furosemide  40 mg Oral Daily  . guaiFENesin  1,200 mg Oral BID  . insulin aspart  0-20 Units Subcutaneous TID WC  . insulin aspart  0-5 Units Subcutaneous QHS  . insulin aspart  6 Units Subcutaneous TID WC  . insulin glargine  12 Units Subcutaneous Daily  . levalbuterol  1.25 mg Nebulization Q4H WA   And  . ipratropium  0.5 mg Nebulization Q4H WA  . levothyroxine  12.5 mcg Oral Once per day on Mon Wed Fri  . levothyroxine  125  mcg Oral QAC breakfast  . methylPREDNISolone (SOLU-MEDROL) injection  60 mg Intravenous Q6H  . mycophenolate  1,000 mg Oral BID  . pantoprazole  40 mg Oral Daily  . polyethylene glycol  17 g Oral BID  . senna-docusate  1 tablet Oral BID  . sodium chloride  3 mL Nebulization Daily  . terazosin  10 mg Oral QHS  Continuous Infusions: . sodium chloride       LOS: 13 days    Time spent: 40 minutes    Katrinka Blazing, MD [facility] Pager [phone]  If 7PM-7AM, please contact night-coverage [email or web address] Password [email or web address] 06/02/2016, 11:46 AM

## 2016-06-02 NOTE — Care Management Note (Signed)
Case Management Note  Patient Details  Name: Raymond [name] MRN: [id number] Date of Birth: 1929-05-01  Expected Discharge Date:       06/03/2016           Expected Discharge Plan:  Home w Hospice Care  In-House Referral:  NA  Discharge planning Services  CM Consult  Post Acute Care Choice:  Durable Medical Equipment Choice offered to:  Patient  DME Arranged:  Walker rolling with seat, Hospital bed, 3-N-1 DME Agency:  [facility]  [facility]  Status of Service:  In process, will continue to follow  Additional Comments: Pt/wife plan on DCing home with Hopsice services. [facility] able [facility] pt and admit on 06/03/2016. Pt will need hospital bed and BSC delivered prior to discharging. [facility] called [facility] Per hospice DME will be delivered later today. Will plan for hospital DC on 06/03/2016.  Malcolm Metrohildress, Karinne Schmader Demske, RN 06/02/2016, 2:09 PM

## 2016-06-02 NOTE — Progress Notes (Signed)
Daily Progress Note   Patient Name: Raymond [name]       Date: 06/02/2016 DOB: 12-13-1929  Age: 83 y.o. MRN#: [id number] Attending Physician: Filbert Schilder, MD Primary Care Physician: Rudi Heap, MD Admit Date: 05/20/2016  Reason for Consultation/Follow-up: Establishing goals of care, Non pain symptom management and Psychosocial/spiritual support  Subjective: Mr. Raymond [name] is sitting in his Jeri chair with his wife at bedside. He is sleepy but able to quickly open his eyes when I ask him. Wife states that Mr. Raymond [name] has been reaching out at times. We again talk about the side effects of Morphine use and that I expect these to last only a few days. She is tearful during our conversation.  Mrs. Raymond [name] states they have been together for 62 years, and have no children, just one nephew who came to visit last night.    Mrs Raymond [name] states her goal is "for him to get his breath and take him home". We talk about the benefits of hospice in home in detail including aids to help with bathing, registered nurse visits, social work and chaplain visits. Mr. Raymond [name] asks about cost and I share that this is already paid for through his Medicare benefit, and all at no cost to him. Mrs. Raymond [name] states that she needs help at home. She goes further to tearfully state that she is, "so upset that he has unfixable problems". I share with her that hospice will help her through this difficult time. She also states, "I can't go on without him". I encourage Mrs. Raymond [name] to consider Mr. Raymond [name] condition and that hospice will help her. Mrs. Raymond [name] talks about her brother-in-law stay in hospice. She states that he was not eating, his wife insisted on IV fluids, and he went home, and "did fine". She  states that he "lived a long time". After further discussion she states that he lived approximately 2 months (she thinks) but returned to hospice home. She is, however, still agreeable to in-home help with hospice. They state they prefer [facility], but live in [place].  Mrs. Jent states she doesn't want her husband to be "sedated".  We talk about symptom management again and the benefits of morphine. I asked Mr. Raymond [name] if he would rather be awake and struggling to breathe or asleep and calm.  Mrs. Thrall starts working on the lunch tray during this part of the conversation.  I encouraged her to listen to his answer. Mr. Raymond [name] clearly states that he would rather be asleep and not struggling to breathe.  Length of Stay: 13  Current Medications: Scheduled Meds:  . acetylcysteine  3 mL Nebulization TID  . apixaban  5 mg Oral BID  . aspirin EC  325 mg Oral Daily  . atorvastatin  10 mg Oral Daily  . bisacodyl  10 mg Rectal Once  . bisacodyl  10 mg Rectal Once  . citalopram  20 mg Oral Daily  . diltiazem  120 mg Oral Daily  . furosemide  40 mg Intravenous Once  . furosemide  40 mg Oral Daily  . guaiFENesin  1,200 mg Oral BID  . insulin aspart  0-20 Units Subcutaneous TID WC  . insulin aspart  0-5 Units Subcutaneous QHS  . insulin aspart  6 Units Subcutaneous TID WC  . insulin glargine  12 Units Subcutaneous Daily  . levalbuterol  1.25 mg Nebulization Q4H WA   And  . ipratropium  0.5 mg Nebulization Q4H WA  . levothyroxine  12.5 mcg Oral Once per day on Mon Wed Fri  . levothyroxine  125 mcg Oral QAC breakfast  . methylPREDNISolone (SOLU-MEDROL) injection  60 mg Intravenous Q6H  . mycophenolate  1,000 mg Oral BID  . pantoprazole  40 mg Oral Daily  . polyethylene glycol  17 g Oral BID  . senna-docusate  1 tablet Oral BID  . sodium chloride  3 mL Nebulization Daily  . terazosin  10 mg Oral QHS    Continuous Infusions: . sodium chloride      PRN  Meds: acetaminophen **OR** acetaminophen, albuterol, morphine CONCENTRATE, ondansetron **OR** ondansetron (ZOFRAN) IV, sodium chloride  Physical Exam  Constitutional:  Sitting in [place] chair, makes eye contact when asked. Acutely/chronically ill appearing.   HENT:  Head: Normocephalic and atraumatic.  Cardiovascular: Normal rate and regular rhythm.   Pulmonary/Chest:  WOB noted,   Abdominal: Soft. He exhibits no distension.  Musculoskeletal: He exhibits no edema.  Neurological:  Opens eyes to command, cooperative.   Skin: Skin is warm and dry.  Nursing note and vitals reviewed.           Vital Signs: BP (!) 128/55 (BP Location: Left Arm)   Pulse 85   Temp 98.7 F (37.1 C) (Oral)   Resp 20   Ht 5\' 6"  (1.676 m)   Wt 75.3 kg (166 lb)   SpO2 92%   BMI 26.79 kg/m  SpO2: SpO2: 92 % O2 Device: O2 Device: Nasal Cannula O2 Flow Rate: O2 Flow Rate (L/min): 5 L/min  Intake/output summary:  Intake/Output Summary (Last 24 hours) at 06/02/16 1326 Last data filed at 06/02/16 0754  Gross per 24 hour  Intake              240 ml  Output              550 ml  Net             -310 ml   LBM: Last BM Date: 06/01/16 Baseline Weight: Weight: 74.8 kg (165 lb) Most recent weight: Weight: 75.3 kg (166 lb)       Palliative Assessment/Data:    Flowsheet Rows   Flowsheet Row Most Recent Value  Intake Tab  Referral Department  Hospitalist  Unit at Time of Referral  Med/Surg Unit  Palliative Care Primary Diagnosis  Pulmonary  Date Notified  06/01/16  Palliative Care Type  New Palliative care  Reason for referral  Clarify Goals of Care  Date of Admission  05/20/16  Date first seen by Palliative Care  06/01/16  # of days Palliative referral response time  0 Day(s)  # of days IP prior to Palliative referral  12  Clinical Assessment  Palliative Performance Scale Score  20%  Pain Max last 24 hours  Not able to report  Pain Min Last 24 hours  Not able to report  Dyspnea Max Last 24 Hours   Not able to report  Dyspnea Min Last 24 hours  Not able to report  Psychosocial & Spiritual Assessment  Palliative Care Outcomes  Patient/Family meeting held?  Yes  Who was at the meeting?  patient and wife  Palliative Care Outcomes  Improved non-pain symptom therapy  Palliative Care follow-up planned  -- [fu while at [facility]]      Patient Active Problem List   Diagnosis Date Noted  . Atrial fibrillation (HCC) 06/01/2016  . Pancreatic mass 06/01/2016  . Hypokalemia 06/01/2016  . Shortness of breath   . Goals of care, counseling/discussion   . Palliative care encounter   . Acute on chronic respiratory failure (HCC) 05/20/2016  . COPD with exacerbation (HCC) 07/24/2015  . Hyperglycemia, drug-induced 07/23/2015  . Elevated LFTs 07/23/2015  . CAP (community acquired pneumonia) 07/21/2015  . Acute on chronic respiratory failure with hypoxia (HCC) 07/21/2015  . Pulmonary fibrosis (HCC) 07/21/2015  . Leukocytosis 07/21/2015  . Vitamin D deficiency 02/19/2013  . Interstitial lung disease (HCC) 02/19/2013  . BPH (benign prostatic hyperplasia) 09/04/2012  . Allergic rhinitis 09/24/2010  .  Cough, chronic 06/26/2010  . PULMONARY FIBROSIS ILD POST INFLAMMATORY CHRONIC 12/30/2009  . Hypothyroidism 12/29/2009  . Hyperlipidemia, acquired 12/29/2009  . Coronary atherosclerosis 12/29/2009    Palliative Care Assessment & Plan   Patient Profile: 83 y.o. male  with past medical history of Arthritis, Achilles tendon rupture, BPH, CAD, COPD, GER D, increased cholesterol, interstitial lung disease admitted on 05/20/2016 with acute on chronic respiratory failure with hypoxia, pneumonia that is improving, interstitial lung disease.   Assessment: End-stage pulmonary fibrosis; medical management optimized. Morphine liquid PO added for dyspnea. Family is accepting of in-home hospice services. frailty, functional decline; family is accepting of in-home hospice services. Services discussed in detail.  Family will likely need hospital bed, hoyer.  Recommendations/Plan:  continue to treat the treatable but no extraordinary measures,CPR or intubation. Home with benefits of hospice. Requesting [place], but lives in [place].  Goals of Care and Additional Recommendations:  Limitations on Scope of Treatment: Home with benefits of hospice  Code Status:    Code Status Orders        Start     Ordered   06/02/16 0952  Do not attempt resuscitation (DNR)  Continuous    Question Answer Comment  In the event of cardiac or respiratory ARREST Do not call a "code blue"   In the event of cardiac or respiratory ARREST Do not perform Intubation, CPR, defibrillation or ACLS   In the event of cardiac or respiratory ARREST Use medication by any route, position, wound care, and other measures to relive pain and suffering. May use oxygen, suction and manual treatment of airway obstruction as needed for comfort.      06/02/16 7829    Code Status History    Date Active Date Inactive Code Status Order ID Comments User Context   05/20/2016  3:16 PM 06/02/2016  9:51 AM Partial Code [id number]  Erick BlinksJehanzeb Memon, MD Inpatient   07/21/2015  4:43 PM 07/25/2015  5:57 PM Full Code [id number]  Henderson CloudEstela Y Hernandez Acosta, MD Inpatient       Prognosis:   < 3 months, would not be surprising based on frailty, functional decline, end-stage respiratory.  Discharge Planning:  Home with the benefits of hospice, [facility], but [place] in [place].  Care plan was discussed with nursing staff, case manager, social worker, and Dr. Shirlyn GoltzKaydolf.  Thank you for allowing the Palliative Medicine Team to assist in the care of this patient.   Time In: 0920 Time Out: 0955 Total Time 35 minutes Prolonged Time Billed  no       Greater than 50%  of this time was spent counseling and coordinating care related to the above assessment and plan.  Katheran Aweasha A Amandeep Hogston, NP  Please contact Palliative Medicine  Team phone at [phone] for questions and concerns.

## 2016-06-02 NOTE — Care Management (Signed)
Patient Information   Patient Name Raymond [name], Raymond [name] ([id number]) Sex Male DOB [birth year]  Room Bed  A333 A333-01  Patient Demographics   Address [address] [place] [address] Phone [phone] (Home) *Preferred* [phone] (Mobile)  Patient Ethnicity & Race   Ethnic Group Patient Race  Not Hispanic or Latino White or Caucasian  Emergency Contact(s)   Name Relation Home Work Mobile  Raymond [name],Raymond [name] Spouse [phone]  [phone]  Documents on File    Status Date Received Description  Documents for the Patient  EMR Medication Summary Not Received    EMR Immunization Summary Not Received    EMR Problem Summary Not Received    EMR Patient Summary Not Received    Driver's License Not Received    [place] HIPAA NOTICE OF PRIVACY - Scanned Received 05/18/11 GIK  [place] E-Signature HIPAA Notice of Privacy Received 06/26/10   [place] E-Signature HIPAA Notice of Privacy Spanish Not Received    Advance Directives/Living Will/HCPOA/POA Not Received    Insurance Card Received 04/11/14 BLUE MCR/LAT  Insurance Card Not Received    Technical brewerinancial Application Not Received    Insurance Card Not Received    Release of Information Not Received    Insurance Card Not Received    [place] HIPAA NOTICE OF PRIVACY - Scanned Not Received    Insurance Card Not Received    Release of Information Not Received    Insurance Card Not Received    Release of Information Not Received    Insurance Card Not Received    HIM ROI Authorization Not Received    Release of Information Not Received    HIM ROI Authorization Not Received    Advanced Beneficiary Notice (ABN) Not Received    Driver's License Received 06/21/12   Driver's License Received 06/21/12 WR[facility]/JB  Insurance Card Received 06/21/12   Insurance Card Received 06/21/12 WRFM/BLUE MEDICARE/JB  [place] E-Signature HIPAA Notice of Privacy Received 06/21/12 WRFM/JB  Release of Information Received 07/24/12 WRFM/JB   Insurance Card Received 04/11/13 WRFM/BLUE  AMB Provider Completed Forms  04/22/13 12/14 letter [place] DMV  HIM ROI Authorization (Expired) 12/07/13 Records requested by BCBS Medicare to [place] - [facility]  E-Signature AOB Spanish Not Received    Other Photo ID Not Received    AMB Provider Completed Forms  07/26/14 LETTER/MEDICAL HISTORY EXAMINATION [place] DMV  AMB Correspondence  04/30/14 SIX MINUTE [place] DATA  HIM ROI Authorization  11/22/14 [place] Medicare Risk Adjustment Review  AMB Outside Hospital Record  12/30/14 UPDATED MEDICATION LIST [facility]  AMB Correspondence  01/07/15 OFFICE NOTE [facility]  HIM ROI Authorization (Expired) 01/31/15 Authorization for batch [place]/Blue [employer]    01/31/2015.4  AMB Correspondence  01/28/15 OFFICE NOTE [facility]  AMB Correspondence  03/06/15 [facility]  Insurance Card Received 04/23/15 wrfm/bcbs/2017  Insurance Card   wrfm/2017/bcbs  AMB Correspondence  05/27/15 OFFICE NOTE [facility] MD, E  Insurance Card   BCBS MEDICARE,MEDICARE  AMB Correspondence  11/19/15 NOTES HAWKINS MD, E  Insurance Card Received 03/31/16 blue mcr/wrfm  Release of Information Received 04/08/16 wrfm  Driver's License (Deleted) 09/09/09   AMB Provider Completed Forms (Deleted) 04/22/13 12/14 Med Review [place] DMV  AMB Correspondence (Deleted) 12/30/14 UPDATED MEDICATION LIST [facility]  Documents for the Encounter  AOB (Assignment of Insurance Benefits) Not Received    E-signature AOB Signed 05/20/16   MEDICARE RIGHTS Not Received    E-signature Medicare Rights Signed 05/20/16   ED Patient  Billing Extract   ED PB Summary  ED Patient Billing Extract   ED Encounter Summary  Cardiac Monitoring Strip Shift Summary  05/20/16   Cardiac Monitoring Strip  05/21/16   Ultrasound  05/30/16   Authorization Request Received 06/01/16 Sharla Kidney, INPT 2.22.18  EKG  05/21/16   Study Attachment for Report   External Report   Admission Information   Attending Provider Admitting Provider Admission Type Admission Date/Time  Filbert Schilder, MD  Emergency 05/20/16 [phone]  Discharge Date Hospital Service Auth/Cert Status Service Area   Acute Care Incomplete [place] SERVICE AREA  Unit Room/Bed Admission Status   AP-DEPT 300 A333/A333-01 Admission (Confirmed)   Admission   Complaint  SOB  Hospital Account   Name Acct ID Class Status Primary Coverage  Raymond [name], Raymond [name] [id number] Inpatient Open BLUE CROSS BLUE SHIELD MEDICARE - BCBS MEDICARE      Guarantor Account (for Hospital Account [id number])   Name Relation to Pt Service Area Active? Acct Type  Raymond [name] Self CHSA Yes Personal/Family  Address Phone    [address] [place], [place] [address] [phone]([name])        Coverage Information (for Hospital Account [id number])   F/O Payor/Plan Precert #  [facility] SHIELD MEDICARE/BCBS MEDICARE   Subscriber Subscriber #  [place], [place] [id number]  Address Phone  [address] [address] [place]

## 2016-06-02 NOTE — Progress Notes (Signed)
PT Cancellation Note  Patient Details Name: Raymond [name] MRN: [id number] DOB: 1930-03-20   Cancelled Treatment:    Reason Eval/Treat Not Completed: Other (comment) (Pt continues to be SOB when speaking, and expressed that he is not up for doing PT at this time.  Wife confirms that she has been able to assist him with [facility].  Will attempt back as schedule allows.  )   Beth Myeshia Fojtik, PT, DPT X: [phone]

## 2016-06-03 LAB — GLUCOSE, CAPILLARY
GLUCOSE-CAPILLARY: 229 mg/dL — AB (ref 65–99)
Glucose-Capillary: 309 mg/dL — ABNORMAL HIGH (ref 65–99)
Glucose-Capillary: 52 mg/dL — ABNORMAL LOW (ref 65–99)
Glucose-Capillary: 36 mg/dL — CL (ref 65–99)

## 2016-06-03 MED ORDER — DILTIAZEM HCL ER COATED BEADS 120 MG PO CP24: 120.0000 mg | ORAL_CAPSULE | Freq: Every day | ORAL | 0 refills | Status: AC

## 2016-06-03 MED ORDER — LEVALBUTEROL HCL 1.25 MG/0.5ML IN NEBU: 1.2500 mg | INHALATION_SOLUTION | RESPIRATORY_TRACT | 12 refills | Status: AC

## 2016-06-03 MED ORDER — ONDANSETRON HCL 4 MG PO TABS: 4.0000 mg | ORAL_TABLET | Freq: Four times a day (QID) | ORAL | 0 refills | Status: AC | PRN

## 2016-06-03 MED ORDER — APIXABAN 5 MG PO TABS: 5.0000 mg | ORAL_TABLET | Freq: Two times a day (BID) | ORAL | 0 refills | Status: AC

## 2016-06-03 MED ORDER — BISACODYL 10 MG RE SUPP: 10.0000 mg | Freq: Once | RECTAL | 0 refills | Status: AC

## 2016-06-03 MED ORDER — DEXTROSE 50 % IV SOLN: 50.0000 mL | Freq: Once | INTRAVENOUS | Status: DC

## 2016-06-03 MED ORDER — MORPHINE SULFATE (CONCENTRATE) 10 MG/0.5ML PO SOLN: 5.0000 mg | ORAL | 0 refills | Status: AC | PRN

## 2016-06-03 MED ORDER — DEXTROSE 50 % IV SOLN
INTRAVENOUS | Status: AC
  Administered 2016-06-03: 50 mL
  Filled 2016-06-03: qty 50

## 2016-06-03 MED ORDER — IPRATROPIUM BROMIDE 0.02 % IN SOLN
0.5000 mg | RESPIRATORY_TRACT | 12 refills | Status: AC
Start: 2016-06-03 — End: ?

## 2016-06-03 MED ORDER — PREDNISONE 20 MG PO TABS: 40.0000 mg | ORAL_TABLET | Freq: Every day | ORAL | 0 refills | Status: AC

## 2016-06-03 MED ORDER — MORPHINE SULFATE (CONCENTRATE) 10 MG/0.5ML PO SOLN
5.0000 mg | ORAL | Status: DC | PRN
  Administered 2016-06-03: 5 mg via ORAL
  Filled 2016-06-03: qty 0.5

## 2016-06-03 NOTE — Care Management Note (Addendum)
Case Management Note  Patient Details  Name: Raymond SkainsWilliam H Hacker MRN: [id number] Date of Birth: Aug 31, 1929  Expected Discharge Date:  06/03/16               Expected Discharge Plan:  Home w Hospice Care  In-House Referral:  NA  Discharge planning Services  CM Consult  Post Acute Care Choice:  Durable Medical Equipment Choice offered to:  Patient  DME Arranged:  Walker rolling with seat, Hospital bed, 3-N-1 DME Agency:  [facility]  HH Arr[facility]  [facility]:  [facility]  Status of Service:  Completed, signed off  Additional Comments: Pt discharging home today with hosp[facility]. Beth, at hospice aware of DC today and will be faxed DC summary. Pt's DME has been delivered to pt home from [facility].  Malcolm Metrohildress, Lyllian Gause Demske, RN 06/03/2016, 2:30 PM

## 2016-06-03 NOTE — Care Management Important Message (Signed)
Important Message  Patient Details  Name: Raymond SkainsWilliam H [name] MRN: [id number] Date of Birth: 01-14-1930   Medicare Important Message Given:  Yes    Malcolm MetroChildress, Immaculate Crutcher Demske, RN 06/03/2016, 2:33 PM

## 2016-06-03 NOTE — Discharge Summary (Addendum)
Physician Discharge Summary  Raymond [name]:[id number] DOB: 08-23-1929 DOA: 05/20/2016  PCP: Rudi Heap, MD  Admit date: 05/20/2016 Discharge date: 06/03/2016  Admitted From: Home Disposition:  Home with Hospice Services  Recommendations for Outpatient Follow-up:  1. Follow up with hospice physician 2. Use medications as prescribed 3. Please discuss taper of steroids with hospice physician   Home Health: Hospice Services Equipment/Devices: Oxygen 4L [place]   Discharge Condition: Hospice  CODE STATUS:DNR Diet recommendation: Regular diet  Brief/Interim Summary: 83 year old male with a history of chronic respiratory failure on 4 L of oxygen, COPD, interstitial lung disease/pulmonary fibrosis, came to the hospital with worsening shortness of breath and found to have possible pneumonia, COPD exacerbation. Started on IV steroids, antibiotics and bronchodilators. Slowly improving and not able to expectorate much sputum. Pulmonology following and appreciate recc's. Still remains very SOB and dyspneic. Ordered ECHOcardiogram to evaluate for Heart Failure and ordered CT A of Chest because patient was still dyspneic to r/o Pulmonary Embolus. Unfortunately patient went into Atrial Fibrillation with RVR but was running in 115-120's so Heparin gtt was started and Cardiology was consulted. Patient converted into NSR and Palliative Care was consulted for Goals of Care and Symptom Management given patient's significant decline and symptom burden.  Patient decided on 06/02/16 that he wished to become a DNR and voiced that he would rather be sedated and not struggling to breath than awake and struggling.  Family was accepting of hospice services.  He was stable to discharge on 06/03/16 to home with hospice services.  Discharge Diagnoses:  Principal Problem:   Acute on chronic respiratory failure (HCC) Active Problems:   Hypothyroidism   Hyperlipidemia, acquired   PULMONARY FIBROSIS ILD POST INFLAMMATORY  CHRONIC   CAP (community acquired pneumonia)   Pulmonary fibrosis (HCC)   Hyperglycemia, drug-induced   COPD with exacerbation (HCC)   Atrial fibrillation (HCC)   Pancreatic mass   Hypokalemia   Shortness of breath   Goals of care, counseling/discussion   Palliative care encounter   DNR (do not resuscitate) discussion   Respiratory distress    Discharge Instructions  Discharge Instructions    Activity as tolerated - No restrictions    Complete by:  As directed    Call MD for:  difficulty breathing, headache or visual disturbances    Complete by:  As directed    Call MD for:  extreme fatigue    Complete by:  As directed    Call MD for:  hives    Complete by:  As directed    Call MD for:  persistant dizziness or light-headedness    Complete by:  As directed    Call MD for:  persistant nausea and vomiting    Complete by:  As directed    Call MD for:  severe uncontrolled pain    Complete by:  As directed    Call MD for:  temperature >100.4    Complete by:  As directed    Diet general    Complete by:  As directed    Discharge instructions    Complete by:  As directed    Pain and anxiolytic medication per hospice protocol     Allergies as of 06/03/2016      Reactions   Codeine Other (See Comments)   hallucinations   Levaquin [levofloxacin In D5w] Other (See Comments)   Tendonitis      Medication List    STOP taking these medications   amoxicillin-clavulanate 875-125 MG tablet Commonly  known as:  AUGMENTIN     TAKE these medications   albuterol (2.5 MG/3ML) 0.083% nebulizer solution Commonly known as:  PROVENTIL Take 3 mLs (2.5 mg total) by nebulization every 6 (six) hours as needed for wheezing or shortness of breath.   apixaban 5 MG Tabs tablet Commonly known as:  ELIQUIS Take 1 tablet (5 mg total) by mouth 2 (two) times daily.   aspirin EC 325 MG tablet Take 325 mg by mouth daily.   atorvastatin 10 MG tablet Commonly known as:  LIPITOR Take 1 tablet  (10 mg total) by mouth daily.   bisacodyl 10 MG suppository Commonly known as:  DULCOLAX Place 1 suppository (10 mg total) rectally once.   citalopram 20 MG tablet Commonly known as:  CELEXA Take 1 tablet (20 mg total) by mouth daily.   diltiazem 120 MG 24 hr capsule Commonly known as:  CARDIZEM CD Take 1 capsule (120 mg total) by mouth daily.   ipratropium 0.02 % nebulizer solution Commonly known as:  ATROVENT Take 2.5 mLs (0.5 mg total) by nebulization every 4 (four) hours.   levalbuterol 1.25 MG/0.5ML nebulizer solution Commonly known as:  XOPENEX Take 1.25 mg by nebulization every 4 (four) hours.   levothyroxine 25 MCG tablet Commonly known as:  SYNTHROID, LEVOTHROID Take 12.5 mcg by mouth. Take Monday, Wednesday and Friday with the   levothyroxine 125 MCG tablet Commonly known as:  SYNTHROID, LEVOTHROID Take 1 tablet (125 mcg total) by mouth daily.   morphine CONCENTRATE 10 MG/0.5ML Soln concentrated solution Take 0.25 mLs (5 mg total) by mouth every 2 (two) hours as needed for moderate pain, severe pain, anxiety or shortness of breath. Start taking on:  2016/06/14   mycophenolate 500 MG tablet Commonly known as:  CELLCEPT Take 2 tablets by mouth 2 (two) times daily.   omeprazole 20 MG capsule Commonly known as:  PRILOSEC Take 1 capsule (20 mg total) by mouth every evening.   ondansetron 4 MG tablet Commonly known as:  ZOFRAN Take 1 tablet (4 mg total) by mouth every 6 (six) hours as needed for nausea.   predniSONE 20 MG tablet Commonly known as:  DELTASONE Take 2 tablets (40 mg total) by mouth daily with breakfast. What changed:  medication strength  how much to take  when to take this   terazosin 10 MG capsule Commonly known as:  HYTRIN Take 1 capsule (10 mg total) by mouth at bedtime.   Vitamin D 2000 units Caps Take 2,000-4,000 Units by mouth daily. Takes 2000 units everyday except for Fri, Sat, and Sun patient takes 4000 units.             Durable Medical Equipment        Start     Ordered   05/27/16 1115  For home use only DME 4 wheeled rolling walker with seat  Once    Question:  Patient needs a walker to treat with the following condition  Answer:  Pulmonary fibrosis (HCC)   05/27/16 1115      Allergies  Allergen Reactions  . Codeine Other (See Comments)    hallucinations  . Levaquin [Levofloxacin In D5w] Other (See Comments)    Tendonitis    Consultations:  Cardiology  PT  Palliative care  CM    Procedures/Studies: Dg Chest 2 View  Result Date: 05/20/2016 CLINICAL DATA:  Shortness of breath. EXAM: CHEST  2 VIEW COMPARISON:  CT 07/21/2015 .  Chest x-ray 07/21/2015, 03/27/2012. FINDINGS: Cardiomegaly with normal pulmonary  vascularity again noted. Diffuse interstitial changes noted consistent with chronic interstitial lung disease. Degenerative changes thoracic spine. IMPRESSION: 1.  Stable cardiomegaly. 2. Chronic interstitial changes consistent with chronic interstitial lung disease. Superimposed active interstitial process cannot be excluded. Electronically Signed   By: Maisie Fushomas  Register   On: 05/20/2016 10:18   Ct Angio Chest Pe W Or Wo Contrast  Result Date: 05/31/2016 CLINICAL DATA:  Chronic short of breath. History of interstitial lung disease. EXAM: CT ANGIOGRAPHY CHEST WITH CONTRAST TECHNIQUE: Multidetector CT imaging of the chest was performed using the standard protocol during bolus administration of intravenous contrast. Multiplanar CT image reconstructions and MIPs were obtained to evaluate the vascular anatomy. CONTRAST:  100 cc Isovue 370 COMPARISON:  07/21/2015 FINDINGS: Cardiovascular: There are no filling defects in the pulmonary arterial tree to suggest acute pulmonary thromboembolism. Scattered atherosclerotic calcifications in the aortic arch, great vessels, and coronary arteries. Mediastinum/Nodes: Prominent mediastinal fat. No abnormal mediastinal adenopathy. Thyroid is not visualized.  Lungs/Pleura: Lungs are very under aerated. Fibrotic changes throughout the right lung are not significantly changed. Confluent consolidation seen on the prior study has largely resolved. Some minimal confluent opacities towards the right lung base likely reflect volume loss. Similar areas of sized segmental atelectasis at the left base. Minimal of fibrotic changes in the peripheral left upper lobe. Upper Abdomen: Postcholecystectomy. There is a cystic lesion in the pancreatic head measuring 2.8 cm. It is stable compared with 07/21/2015 but enlarged when compared to the chest CT dated 03/27/2012. Today it measures up to 2.8 cm. Musculoskeletal: No vertebral compression deformity. Review of the MIP images confirms the above findings. IMPRESSION: No evidence of acute pulmonary thromboembolism Stable pulmonary fibrosis Confluent consolidation towards the right lung base has improved supporting resolution of a prior inflammatory process. Residual confluent densities likely will flank volume loss Enlarging cystic abnormality in the head of the pancreas compared with 2013. Slow-growing cystic neoplasm is not excluded. MRI of the pancreas is recommended. Electronically Signed   By: Jolaine ClickArthur  Hoss M.D.   On: 05/31/2016 15:43   [place] Venous Img Upper Uni Left  Result Date: 05/30/2016 CLINICAL DATA:  Left upper extremity edema, pain and erythema. EXAM: LEFT UPPER EXTREMITY VENOUS DOPPLER ULTRASOUND TECHNIQUE: Gray-scale sonography with graded compression, as well as color Doppler and duplex ultrasound were performed to evaluate the upper extremity deep venous system from the level of the subclavian vein and including the jugular, axillary, basilic, radial, ulnar and upper cephalic vein. Spectral Doppler was utilized to evaluate flow at rest and with distal augmentation maneuvers. COMPARISON:  None. FINDINGS: Contralateral Subclavian Vein: Respiratory phasicity is normal and symmetric with the symptomatic side. No evidence of  thrombus. Normal compressibility. Internal Jugular Vein: No evidence of thrombus. Normal compressibility, respiratory phasicity and response to augmentation. Subclavian Vein: No evidence of thrombus. Normal compressibility, respiratory phasicity and response to augmentation. Axillary Vein: No evidence of thrombus. Normal compressibility, respiratory phasicity and response to augmentation. Cephalic Vein: No evidence of thrombus. Normal compressibility, respiratory phasicity and response to augmentation. Basilic Vein: No evidence of thrombus. Normal compressibility, respiratory phasicity and response to augmentation. Brachial Veins: No evidence of thrombus. Normal compressibility, respiratory phasicity and response to augmentation. Radial Veins: No evidence of thrombus. Normal compressibility, respiratory phasicity and response to augmentation. Ulnar Veins: No evidence of thrombus. Normal compressibility, respiratory phasicity and response to augmentation. Venous Reflux:  None visualized. Other Findings: No evidence of superficial thrombophlebitis or abnormal fluid collection. IMPRESSION: No evidence of left upper extremity deep venous thrombosis. Electronically  Signed   By: Irish Lack M.D.   On: 05/30/2016 13:56   Dg Chest Port 1 View  Result Date: 05/27/2016 CLINICAL DATA:  Shortness of breath . EXAM: PORTABLE CHEST 1 VIEW COMPARISON:  05/20/2016 . FINDINGS: Cardiomegaly with progressive bilateral from interstitial prominence noted consistent CHF. Pneumonitis cannot be excluded. Underlying chronic interstitial lung disease. Contrast in the colon. No acute bony abnormality . IMPRESSION: Cardiomegaly with progressive bilateral pulmonary interstitial prominence consistent CHF. Bilateral pneumonitis cannot be excluded. Underlying chronic interstitial lung disease . Electronically Signed   By: Maisie Fus  Register   On: 05/27/2016 07:27   Dg Swallowing Func-speech Pathology  Result Date: 05/24/2016 Objective  Swallowing Evaluation: Type of Study: MBS-Modified Barium Swallow Study Patient Details Name: Raymond [name] MRN: [id number] Date of Birth: [birth year] Today's Date: 05/24/2016 Time: SLP Start Time (ACUTE ONLY): 1305-SLP Stop Time (ACUTE ONLY): 1335 SLP Time Calculation (min) (ACUTE ONLY): 30 min Past Medical History: Past Medical History: Diagnosis Date . Achilles tendon rupture  . Arthritis  . BPH (benign prostatic hyperplasia)  . CAD (coronary artery disease)  . Cataracts, bilateral  . COPD (chronic obstructive pulmonary disease) (HCC)  . GERD (gastroesophageal reflux disease)  . Hyperlipemia  . Hypothyroid  . ILD (interstitial lung disease) (HCC)  . Prostate hyperplasia, benign localized, with urinary obstruction  Past Surgical History: Past Surgical History: Procedure Laterality Date . ACHILLES TENDON SURGERY  08/20/2011  Procedure: ACHILLES TENDON REPAIR;  Surgeon: Sherri Rad, MD;  Location: [facility];  Service: Orthopedics;  Laterality: Right;  Right primary achilles tendon repair, gastroc slide . APPENDECTOMY   . CHOLECYSTECTOMY   . COLONOSCOPY   . EYE SURGERY    cataracts HPI: 83 year old male with a history of chronic respiratory failure on 4 L of oxygen, COPD, interstitial lung disease/pulmonary fibrosis, came to the hospital with worsening shortness of breath and found to have possible pneumonia, COPD exacerbation. Started on steroids, antibiotics and bronchodilators. Slowly improving. Pulmonology following. MD ordered BSE due to concerns about aspiration. Subjective: "I have trouble swallowing big pills and dry foods." Assessment / Plan / Recommendation CHL IP CLINICAL IMPRESSIONS 05/24/2016 Clinical Impression Pt seen in lateral and AP view for MBSS and assessed with the following barium tinged textures/consistencies: thin, puree, regular, barium tablet, and nectar-thick liquids. Oropharyngeal swallow essentially WNL for age with swallow trigger at the level of the valleculae across  consistencies and textures. Pt with mild lingual residuals after primary swallow, however he independently swallows again to clear any residuals that have pooled in valleculae. Pt with one episode of trace penetration (underepiglottic coating) on second swallow of thin from pooled residuals. Pt was cued to clear throat and it was no longer visualized. Of note, Pt has a prominent cricopharyngeus (CP bar) with what appeared to be an outpouching just above (hypopharyngeal pouch), however no radiologist present to confirm presence of Zenker's diverticulum. Pt was turned AP and it was again visualized. Pharyngeal transit appeared to be more to the left once past the valleculae (mild vallecular pooling noted on the right when assessed with NTL). Pt was presented with NTL to allow for greater contrast and visualization which yielded collection of the tail end of bolus and backflow (only slight) into pyriforms. No overt or gross amount of backflow noted. Recommend D3/mech soft with thin liquids with aspiration/reflux precautions: Pt to sit fully upright for all po and remain up for 30 minutes after, swallow 2x for each sip of liquid and clear throat/cough intermittently; Pt may  wish to avoid dry, crumbly foods. He may have difficulty with some medications. SLP to follow up x1 for pt/family education.  SLP Visit Diagnosis Dysphagia, pharyngoesophageal phase (R13.14) Attention and concentration deficit following -- Frontal lobe and executive function deficit following -- Impact on safety and function Mild aspiration risk;Moderate aspiration risk   CHL IP TREATMENT RECOMMENDATION 05/24/2016 Treatment Recommendations Therapy as outlined in treatment plan below   Prognosis 05/24/2016 Prognosis for Safe Diet Advancement Fair Barriers to Reach Goals Severity of deficits Barriers/Prognosis Comment CP bar CHL IP DIET RECOMMENDATION 05/24/2016 SLP Diet Recommendations Dysphagia 3 (Mech soft) solids;Thin liquid Liquid Administration via  Cup Medication Administration Whole meds with liquid Compensations Multiple dry swallows after each bite/sip Postural Changes Remain semi-upright after after feeds/meals (Comment);Seated upright at 90 degrees   CHL IP OTHER RECOMMENDATIONS 05/24/2016 Recommended Consults Consider esophageal assessment Oral Care Recommendations Oral care BID Other Recommendations Clarify dietary restrictions   CHL IP FOLLOW UP RECOMMENDATIONS 05/24/2016 Follow up Recommendations None   CHL IP FREQUENCY AND DURATION 05/24/2016 Speech Therapy Frequency (ACUTE ONLY) min 1 x/week Treatment Duration 1 week      CHL IP ORAL PHASE 05/24/2016 Oral Phase Impaired Oral - Pudding Teaspoon -- Oral - Pudding Cup -- Oral - Honey Teaspoon -- Oral - Honey Cup -- Oral - Nectar Teaspoon -- Oral - Nectar Cup -- Oral - Nectar Straw -- Oral - Thin Teaspoon -- Oral - Thin Cup -- Oral - Thin Straw -- Oral - Puree -- Oral - Mech Soft -- Oral - Regular -- Oral - Multi-Consistency -- Oral - Pill -- Oral Phase - Comment Pt generally swallowed 2x for each sip/bite  CHL IP PHARYNGEAL PHASE 05/24/2016 Pharyngeal Phase Impaired Pharyngeal- Pudding Teaspoon -- Pharyngeal -- Pharyngeal- Pudding Cup -- Pharyngeal -- Pharyngeal- Honey Teaspoon -- Pharyngeal -- Pharyngeal- Honey Cup -- Pharyngeal -- Pharyngeal- Nectar Teaspoon -- Pharyngeal -- Pharyngeal- Nectar Cup Delayed swallow initiation-vallecula;Pharyngeal residue - valleculae Pharyngeal -- Pharyngeal- Nectar Straw -- Pharyngeal -- Pharyngeal- Thin Teaspoon -- Pharyngeal -- Pharyngeal- Thin Cup Delayed swallow initiation-vallecula;Penetration/Apiration after swallow;Pharyngeal residue - valleculae Pharyngeal Material does not enter airway;Material enters airway, remains ABOVE vocal cords then ejected out Pharyngeal- Thin Straw -- Pharyngeal -- Pharyngeal- Puree Pharyngeal residue - valleculae Pharyngeal -- Pharyngeal- Mechanical Soft -- Pharyngeal -- Pharyngeal- Regular -- Pharyngeal -- Pharyngeal- Multi-consistency  -- Pharyngeal -- Pharyngeal- Pill -- Pharyngeal -- Pharyngeal Comment --  CHL IP CERVICAL ESOPHAGEAL PHASE 05/24/2016 Cervical Esophageal Phase Impaired Pudding Teaspoon -- Pudding Cup -- Honey Teaspoon -- Honey Cup -- Nectar Teaspoon -- Nectar Cup -- Nectar Straw Prominent cricopharyngeal segment;Esophageal backflow into the pharynx Thin Teaspoon -- Thin Cup -- Thin Straw -- Puree Prominent cricopharyngeal segment Mechanical Soft -- Regular -- Multi-consistency -- Pill -- Cervical Esophageal Comment Prominent cricopharyngeus; suspect small Zenker's No flowsheet data found. Thank you, Havery Moros, CCC-SLP [phone] PORTER,DABNEY 05/24/2016, 6:31 PM                 Subjective: Patient is sitting up in chair with eyes closed.  He answers questions with one word answers.  Says breathing is hard.  Denies that breathing is easier than yesterday.  He does want to go home.  Discharge Exam: Vitals:   06/02/16 2145 06/03/16 0700  BP: (!) 147/60 (!) 113/53  Pulse: 87 89  Resp: 18 20  Temp: 97.9 F (36.6 C) 98.7 F (37.1 C)   Vitals:   06/02/16 2341 06/03/16 0614 06/03/16 0700 06/03/16 1118  BP:   (!) 113/53  Pulse:   89   Resp:   20   Temp:   98.7 F (37.1 C)   TempSrc:   Oral   SpO2: 92% (!) 89% 99% 91%  Weight:      Height:        General: Pt is alert, awake, moderate distress Cardiovascular: RRR, S1/S2 +, no rubs, no gallops Respiratory: significant increased work of breathing, rhonchi in all lung fields bilaterally, poor inspiratory effort Abdominal: Soft, NT, ND, bowel sounds + Extremities: no edema, no cyanosis    The results of significant diagnostics from this hospitalization (including imaging, microbiology, ancillary and laboratory) are listed below for reference.     Microbiology: No results found for this or any previous visit (from the past 240 hour(s)).   Labs: BNP (last 3 results)  Recent Labs  07/21/15 0823 05/20/16 0920 05/31/16 0606  BNP 77.0 36.0 67.0    Basic Metabolic Panel:  Recent Labs Lab 05/29/16 0647 05/30/16 0609 05/31/16 0606 06/01/16 0446 06/02/16 0449  NA 137 141 141 138 138  K 4.0 4.1 4.0 3.2* 4.7  CL 93* 91* 90* 87* 91*  CO2 39* 42* 44* 41* 37*  GLUCOSE 200* 198* 177* 204* 212*  BUN 28* 30* 34* 36* 40*  CREATININE 0.57* 0.60* 0.64 0.57* 0.68  CALCIUM 8.8* 9.0 9.2 9.0 8.9  MG 1.9 1.8 1.9 1.9 2.0  PHOS 3.3 3.3 3.2 3.0 3.5   Liver Function Tests:  Recent Labs Lab 05/29/16 0647 05/30/16 0609 05/31/16 0606 06/01/16 0446 06/02/16 0449  AST 32 37 37 38 45*  ALT 47 50 49 51 60  ALKPHOS 40 43 41 43 45  BILITOT 0.8 0.8 0.7 0.9 1.1  PROT 5.1* 5.4* 5.3* 5.1* 5.3*  ALBUMIN 2.9* 3.1* 2.9* 2.8* 2.9*   No results for input(s): LIPASE, AMYLASE in the last 168 hours. No results for input(s): AMMONIA in the last 168 hours. CBC:  Recent Labs Lab 05/29/16 0647 05/30/16 0609 05/31/16 0606 06/01/16 0446 06/02/16 0623  WBC 11.6* 14.9* 13.8* 15.6* 16.0*  NEUTROABS 10.9* 14.2* 13.0* 14.9* 15.2*  HGB 11.5* 12.0* 11.9* 11.7* 11.5*  HCT 36.8* 38.4* 38.1* 37.9* 37.7*  MCV 97.1 97.0 97.2 96.9 97.4  PLT 198 182 185 169 164   Cardiac Enzymes: No results for input(s): CKTOTAL, CKMB, CKMBINDEX, TROPONINI in the last 168 hours. BNP: Invalid input(s): POCBNP CBG:  Recent Labs Lab 06/02/16 1155 06/02/16 1634 06/02/16 2029 06/03/16 0801 06/03/16 1113  GLUCAP 218* 119* 78 229* 309*   D-Dimer No results for input(s): DDIMER in the last 72 hours. Hgb A1c No results for input(s): HGBA1C in the last 72 hours. Lipid Profile No results for input(s): CHOL, HDL, LDLCALC, TRIG, CHOLHDL, LDLDIRECT in the last 72 hours. Thyroid function studies No results for input(s): TSH, T4TOTAL, T3FREE, THYROIDAB in the last 72 hours.  Invalid input(s): FREET3 Anemia work up No results for input(s): VITAMINB12, FOLATE, FERRITIN, TIBC, IRON, RETICCTPCT in the last 72 hours. Urinalysis    Component Value Date/Time   COLORURINE  YELLOW 05/20/2016 0944   APPEARANCEUR CLEAR 05/20/2016 0944   APPEARANCEUR Clear 04/30/2016 0841   LABSPEC 1.020 05/20/2016 0944   PHURINE 6.0 05/20/2016 0944   GLUCOSEU >=500 (A) 05/20/2016 0944   HGBUR NEGATIVE 05/20/2016 0944   BILIRUBINUR NEGATIVE 05/20/2016 0944   BILIRUBINUR Negative 04/30/2016 0841   KETONESUR NEGATIVE 05/20/2016 0944   PROTEINUR NEGATIVE 05/20/2016 0944   UROBILINOGEN negative 12/19/2014 1006   NITRITE NEGATIVE 05/20/2016 0944   LEUKOCYTESUR NEGATIVE 05/20/2016 0944  LEUKOCYTESUR Negative 04/30/2016 0841   Sepsis Labs Invalid input(s): PROCALCITONIN,  WBC,  LACTICIDVEN Microbiology No results found for this or any previous visit (from the past 240 hour(s)).   Time coordinating discharge: 45 minutes  SIGNED:   Katrinka Blazing, MD  [facility] 06/03/2016, 2:21 PM Pager [phone] If 7PM-7AM, please contact night-coverage [email or web address] Password [email or web address]

## 2016-06-03 NOTE — Care Management (Signed)
Plan changed. Pt now wishes to got to Hospice Facility. CSW aware and working on hospice bed. EMS transport on hold.

## 2016-06-03 NOTE — Clinical Social Work Note (Signed)
Patient Information   Patient Name Raymond SkainsGlidewell, Bauer H ([id number]) Sex Male DOB 01/20/1930  Room Bed  A333 A333-01  Patient Demographics   Address [address] [place] [address] Phone [phone] (Home) *Preferred* [phone] (Mobile)  Patient Ethnicity & Race   Ethnic Group Patient Race  Not Hispanic or Latino White or Caucasian  Emergency Contact(s)   Name Relation Home Work Mobile  Spring GardenGlidewell,Margie Spouse [phone]  [phone]  Documents on File    Status Date Received Description  Documents for the Patient  EMR Medication Summary Not Received    EMR Immunization Summary Not Received    EMR Problem Summary Not Received    EMR Patient Summary Not Received    Driver's License Not Received    [place] HIPAA NOTICE OF PRIVACY - Scanned Received 05/18/11 GIK  [place] E-Signature HIPAA Notice of Privacy Received 06/26/10   [place] E-Signature HIPAA Notice of Privacy Spanish Not Received    Advance Directives/Living Will/HCPOA/POA Not Received    Insurance Card Received 04/11/14 BLUE MCR/LAT  Insurance Card Not Received    Technical brewerinancial Application Not Received    Insurance Card Not Received    Release of Information Not Received    Insurance Card Not Received    [place] HIPAA NOTICE OF PRIVACY - Scanned Not Received    Insurance Card Not Received    Release of Information Not Received    Insurance Card Not Received    Release of Information Not Received    Insurance Card Not Received    HIM ROI Authorization Not Received    Release of Information Not Received    HIM ROI Authorization Not Received    Advanced Beneficiary Notice (ABN) Not Received    Driver's License Received 06/21/12   Driver's License Received 06/21/12 [facility]/JB  Insurance Card Received 06/21/12   Insurance Card Received 06/21/12 WR[facility]/BLUE MEDICARE/JB   E-Signature HIPAA Notice of Privacy Received 06/21/12 [facility]/JB  Release of Information Received 07/24/12 [facility]/JB   Insurance Card Received 04/11/13 WR[facility]/BLUE  AMB Provider Completed Forms  04/22/13 12/14 letter [place] DMV  HIM ROI Authorization (Expired) 12/07/13 Records requested by BCBS Medicare to [facility] - [facility]  E-Signature AOB Spanish Not Received    Other Photo ID Not Received    AMB Provider Completed Forms  07/26/14 LETTER/MEDICAL HISTORY EXAMINATION [place] DMV  AMB Correspondence  04/30/14 SIX MINUTE [place] DATA  HIM ROI Authorization  11/22/14 [facility] Medicare Risk Adjustment Review  AMB Outside Hospital Record  12/30/14 UPDATED MEDICATION LIST [facility]  AMB Correspondence  01/07/15 OFFICE NOTE [facility]  HIM ROI Authorization (Expired) 01/31/15 Authorization for batch [employer]    01/31/2015.4  AMB Correspondence  01/28/15 OFFICE NOTE [facility]  AMB Correspondence  03/06/15 [facility]  Insurance Card Received 04/23/15 wrfm/bcbs/2017  Insurance Card   wrfm/2017/bcbs  AMB Correspondence  05/27/15 OFFICE NOTE [facility] MD, E  Insurance Card   BCBS MEDICARE,MEDICARE  AMB Correspondence  11/19/15 NOTES HAWKINS MD, E  Insurance Card Received 03/31/16 blue mcr/wrfm  Release of Information Received 04/08/16 wrfm  Driver's License (Deleted) 09/09/09   AMB Provider Completed Forms (Deleted) 04/22/13 12/14 Med Review [place] DMV  AMB Correspondence (Deleted) 12/30/14 UPDATED MEDICATION LIST [facility]  Documents for the Encounter  AOB (Assignment of Insurance Benefits) Not Received    E-signature AOB Signed 05/20/16   MEDICARE RIGHTS Not Received    E-signature Medicare Rights Signed 05/20/16   ED Patient  Billing Extract   ED PB Summary  ED Patient Billing Extract   ED Encounter Summary  Cardiac Monitoring Strip Shift Summary  05/20/16   Cardiac Monitoring Strip  05/21/16   Ultrasound  05/30/16   Authorization Request Received 06/01/16 Sharla Kidney, INPT 2.22.18  EKG  05/21/16   Study Attachment for Report   External Report   Admission Information   Attending Provider Admitting Provider Admission Type Admission Date/Time  Filbert Schilder, MD  Emergency 05/20/16 [phone]  Discharge Date Hospital Service Auth/Cert Status Service Area   Acute Care Incomplete [place] SERVICE AREA  Unit Room/Bed Admission Status   AP-DEPT 300 A333/A333-01 Admission (Confirmed)   Admission   Complaint  SOB  Hospital Account   Name Acct ID Class Status Primary Coverage  Cougar, Imel [id number] Inpatient Open BLUE CROSS BLUE SHIELD MEDICARE - BCBS MEDICARE      Guarantor Account (for Hospital Account [id number])   Name Relation to Pt Service Area Active? Acct Type  Raymond [name] Self CHSA Yes Personal/Family  Address Phone    [address] [place], [place] [address] [phone](H)        Coverage Information (for Hospital Account [id number])   F/O Payor/Plan Precert #  [facility] SHIELD MEDICARE/BCBS MEDICARE   Subscriber Subscriber #  Iori, Gigante [id number]  Address Phone  [address] [address] [place], [place] [address]

## 2016-06-03 NOTE — Progress Notes (Signed)
Present with Mr Karen KitchensGlidewell and his wife for support. She was very upset for what she expressed was a change in her husband. She said he began expressing he wouldn't be "able to make it home". Decisions were offered [facility]. Spent time supporting her as she continued to share her grief as she was tearful and shared she was to a point that she knew his life couldn't be extended and would not want him to continue to struggle to breath. They were well supported by his care team here.

## 2016-06-03 NOTE — Progress Notes (Signed)
PT Cancellation Note  Patient Details Name: Raymond SkainsWilliam H [name] MRN: [id number] DOB: [birth year]   Cancelled Treatment:    Reason Eval/Treat Not Completed: Other (comment) (Pt has decided to go home with hospice.  Will d/c PT at this time.  )   Beth Nathifa Ritthaler, PT, DPT X: [phone]

## 2016-06-03 NOTE — Progress Notes (Signed)
Daily Progress Note   Patient Name: Raymond [name]       Date: 06/03/2016 DOB: 05-Apr-1929  Age: 83 y.o. MRN#: [id number] Attending Physician: Filbert Schilder, MD Primary Care Physician: Rudi Heap, MD Admit Date: 05/20/2016  Reason for Consultation/Follow-up: Disposition, Establishing goals of care, Hospice Evaluation and Psychosocial/spiritual support  Subjective: [name] is sitting quietly in his chair. He makes eye contact when I ask him to, otherwise he is resting. He denies issues or concerns at this time, and seems relatively comfortable. Present today at bedside is his wife of 35 years, Mikie Misner. Also present today is chaplain Delford Field. Mr. Scantlebury and I talk about his return to home. I share with him that his needs will be provided for by hospice, I encourage him to reach out to them for any needs or problems.  [name] is talking with chaplain Delford Field. She becomes tearful at times. She shares that her cousins came yesterday to help prepare their home for [name] hospital bed. She states that her cousin moved to the furniture out of the way, she even vacuumed and dusted. [name] asks about delivery of the hospital bed today. I get the [facility] number from case management and encourage [name] to call to verify delivery times. She states that her cousin has a key and is ready to be at the home to receive needed equipment.  [name] also talks about the anticipated possible charge for ambulance service to return [name] to their home. I share that sometimes the ambulance service does not file with the insurance company. I encourage Mrs. climbed well to present all of their needs to the hospice provider. I remind her that a  Child psychotherapist will be available to help her with any questions or concerns, and greatly encouraged her to ask for help when needed. I leave [name] with chaplain Delford Field.   Length of Stay: 14  Current Medications: Scheduled Meds:  . acetylcysteine  3 mL Nebulization TID  . apixaban  5 mg Oral BID  . aspirin EC  325 mg Oral Daily  . atorvastatin  10 mg Oral Daily  . bisacodyl  10 mg Rectal Once  . bisacodyl  10 mg Rectal Once  . citalopram  20 mg Oral Daily  . diltiazem  120  mg Oral Daily  . furosemide  40 mg Intravenous Once  . furosemide  40 mg Oral Daily  . guaiFENesin  1,200 mg Oral BID  . insulin aspart  0-20 Units Subcutaneous TID WC  . insulin aspart  0-5 Units Subcutaneous QHS  . insulin aspart  6 Units Subcutaneous TID WC  . insulin glargine  12 Units Subcutaneous Daily  . levalbuterol  1.25 mg Nebulization Q4H WA   And  . ipratropium  0.5 mg Nebulization Q4H WA  . levothyroxine  12.5 mcg Oral Once per day on Mon Wed Fri  . levothyroxine  125 mcg Oral QAC breakfast  . methylPREDNISolone (SOLU-MEDROL) injection  60 mg Intravenous Q6H  . mycophenolate  1,000 mg Oral BID  . pantoprazole  40 mg Oral Daily  . polyethylene glycol  17 g Oral BID  . senna-docusate  1 tablet Oral BID  . sodium chloride  3 mL Nebulization Daily  . terazosin  10 mg Oral QHS    Continuous Infusions: . sodium chloride      PRN Meds: acetaminophen **OR** acetaminophen, albuterol, morphine CONCENTRATE, [START ON 06/15/16] morphine CONCENTRATE, ondansetron **OR** ondansetron (ZOFRAN) IV, sodium chloride  Physical Exam  Constitutional: No distress.  Resting quietly, opens eyes to command. Some work of breathing noted, appears frail  HENT:  Head: Normocephalic and atraumatic.  Cardiovascular: Normal rate and regular rhythm.   Pulmonary/Chest:  5L via [place], some WOB noted.   Abdominal: Soft. He exhibits no distension.  Neurological:  Obeys commands, calm  Skin: Skin is warm and dry.    Nursing note and vitals reviewed.           Vital Signs: BP (!) 113/53 (BP Location: Left Arm)   Pulse 89   Temp 98.7 F (37.1 C) (Oral)   Resp 20   Ht 5\' 6"  (1.676 m)   Wt 75.3 kg (166 lb)   SpO2 91%   BMI 26.79 kg/m  SpO2: SpO2: 91 % O2 Device: O2 Device: Nasal Cannula O2 Flow Rate: O2 Flow Rate (L/min): 5 L/min  Intake/output summary:  Intake/Output Summary (Last 24 hours) at 06/03/16 1123 Last data filed at 06/03/16 0300  Gross per 24 hour  Intake                0 ml  Output              850 ml  Net             -850 ml   LBM: Last BM Date: 06/03/16 Baseline Weight: Weight: 74.8 kg (165 lb) Most recent weight: Weight: 75.3 kg (166 lb)       Palliative Assessment/Data:    Flowsheet Rows   Flowsheet Row Most Recent Value  Intake Tab  Referral Department  Hospitalist  Unit at Time of Referral  Med/Surg Unit  Palliative Care Primary Diagnosis  Pulmonary  Date Notified  06/01/16  Palliative Care Type  New Palliative care  Reason for referral  Clarify Goals of Care  Date of Admission  05/20/16  Date first seen by Palliative Care  06/01/16  # of days Palliative referral response time  0 Day(s)  # of days IP prior to Palliative referral  12  Clinical Assessment  Palliative Performance Scale Score  20%  Pain Max last 24 hours  Not able to report  Pain Min Last 24 hours  Not able to report  Dyspnea Max Last 24 Hours  Not able to report  Dyspnea Min  Last 24 hours  Not able to report  Psychosocial & Spiritual Assessment  Palliative Care Outcomes  Patient/Family meeting held?  Yes  Who was at the meeting?  patient and wife  Palliative Care Outcomes  Improved non-pain symptom therapy  Palliative Care follow-up planned  -- [fu while at [facility]]      Patient Active Problem List   Diagnosis Date Noted  . DNR (do not resuscitate) discussion   . Respiratory distress   . Atrial fibrillation (HCC) 06/01/2016  . Pancreatic mass 06/01/2016  . Hypokalemia 06/01/2016  .  SOB (shortness of breath)   . Goals of care, counseling/discussion   . Palliative care encounter   . Acute on chronic respiratory failure (HCC) 05/20/2016  . COPD with exacerbation (HCC) 07/24/2015  . Hyperglycemia, drug-induced 07/23/2015  . Elevated LFTs 07/23/2015  . CAP (community acquired pneumonia) 07/21/2015  . Acute on chronic respiratory failure with hypoxia (HCC) 07/21/2015  . Pulmonary fibrosis (HCC) 07/21/2015  . Leukocytosis 07/21/2015  . Vitamin D deficiency 02/19/2013  . Interstitial lung disease (HCC) 02/19/2013  . BPH (benign prostatic hyperplasia) 09/04/2012  . Allergic rhinitis 09/24/2010  .  Cough, chronic 06/26/2010  . PULMONARY FIBROSIS ILD POST INFLAMMATORY CHRONIC 12/30/2009  . Hypothyroidism 12/29/2009  . Hyperlipidemia, acquired 12/29/2009  . Coronary atherosclerosis 12/29/2009    Palliative Care Assessment & Plan   Patient Profile: 83 y.o.malewith past medical history of Arthritis, Achilles tendon rupture, BPH, CAD, COPD, GER D, increased cholesterol, interstitial lung diseaseadmitted on 2/22/2018with acute on chronic respiratory failure with hypoxia, pneumonia that is improving, interstitial lung disease.   Assessment: End-stage pulmonary fibrosis; medical management optimized. Morphine liquid PO added for dyspnea. Family is accepting of in-home hospice services. frailty, functional decline; family is accepting of in-home hospice services. Services discussed in detail. Family will need hospital bed.   Recommendations/Plan:  continue to treat the treatable but no extraordinary measures,CPR or intubation. Home with benefits of hospice. Requesting [facility] in home.  Goals of Care and Additional Recommendations:  Limitations on Scope of Treatment: Home with benefits of hospice, no CPR or intubation  Code Status:    Code Status Orders        Start     Ordered   06/02/16 0952  Do not attempt resuscitation (DNR)  Continuous      Question Answer Comment  In the event of cardiac or respiratory ARREST Do not call a "code blue"   In the event of cardiac or respiratory ARREST Do not perform Intubation, CPR, defibrillation or ACLS   In the event of cardiac or respiratory ARREST Use medication by any route, position, wound care, and other measures to relive pain and suffering. May use oxygen, suction and manual treatment of airway obstruction as needed for comfort.      06/02/16 1610    Code Status History    Date Active Date Inactive Code Status Order ID Comments User Context   05/20/2016  3:16 PM 06/02/2016  9:51 AM Partial Code [id number]  Erick Blinks, MD Inpatient   07/21/2015  4:43 PM 07/25/2015  5:57 PM Full Code [id number]  Henderson Cloud, MD Inpatient       Prognosis:   < 3 months or less, would not be surprising based on frailty, functional decline, end-stage respiratory. Life expectancy of weeks would not be surprising.  Discharge Planning:  Home with the benefits of hospice of [facility] plan was discussed with nursing staff, case manager, social worker, and  Dr. Severiano GilbertKadolf.  Thank you for allowing the Palliative Medicine Team to assist in the care of this patient.   Time In: 1010 Time Out: 1045 Total Time 35 minutes Prolonged Time Billed  no       Greater than 50%  of this time was spent counseling and coordinating care related to the above assessment and plan.  Katheran Aweasha A Caasi Giglia, NP  Please contact Palliative Medicine Team phone at [phone] for questions and concerns.

## 2016-06-03 NOTE — Progress Notes (Signed)
Glucose 36. Dextrose 50% given as ordered.

## 2016-06-03 NOTE — Progress Notes (Signed)
Present with Raymond [name] who was resting in the chair and his wife Delray AltMargie. She was expressive and somewhat tearful about the changes she sees in her husband and what may lie ahead for him due to his declining health. She continues to discuss life review. Gave her opportunity to share her anticipatory grief responses. They have support of church family and extended family, especially a nephew. Prayed with them.

## 2016-06-27 DEATH — deceased

## 2016-08-26 ENCOUNTER — Ambulatory Visit: Payer: Medicare Other | Admitting: Family Medicine

## 2017-08-30 IMAGING — US US ABDOMEN COMPLETE
1 series · 14 of 25 positions shown · non-contrast
Comparison: CT 07/21/2015.

CLINICAL DATA: Elevated LFTs.

EXAM:
ABDOMEN ULTRASOUND COMPLETE

[Series 1: us abdomen complete · 0.19mm/px · 14 of 60 slices shown]
[im 1/60]
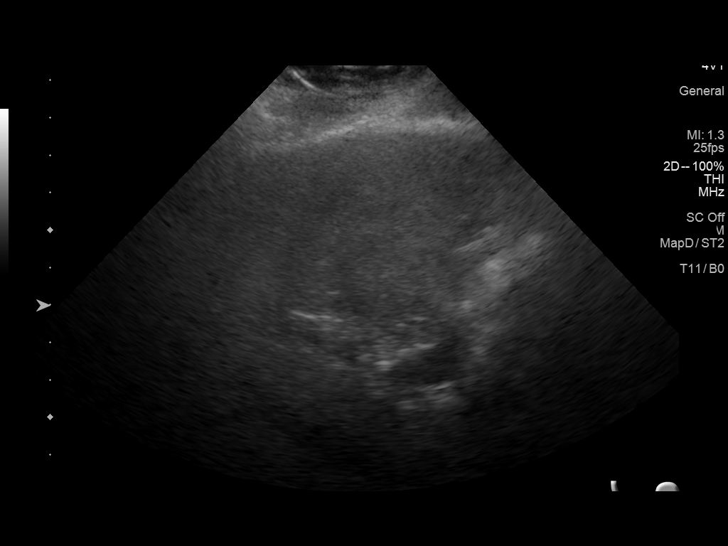
[im 5/60]
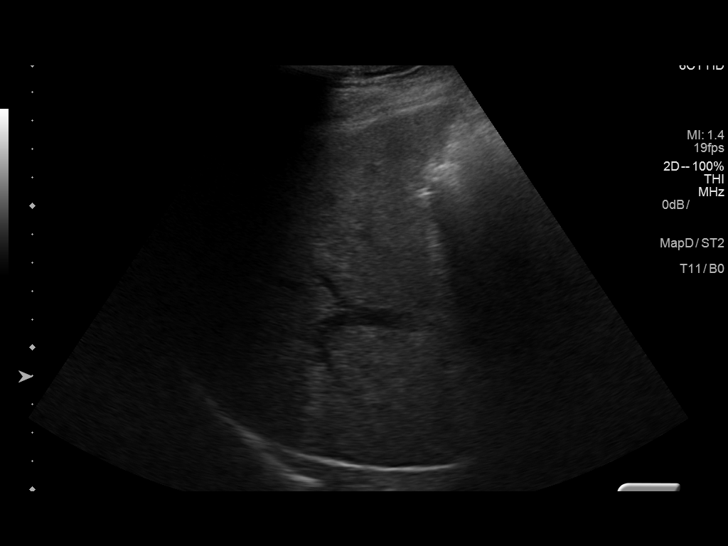
[im 10/60]
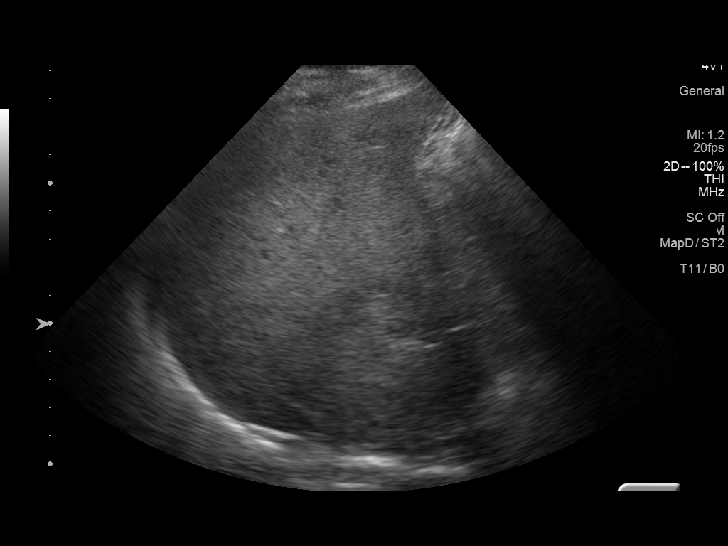
[im 15/60]
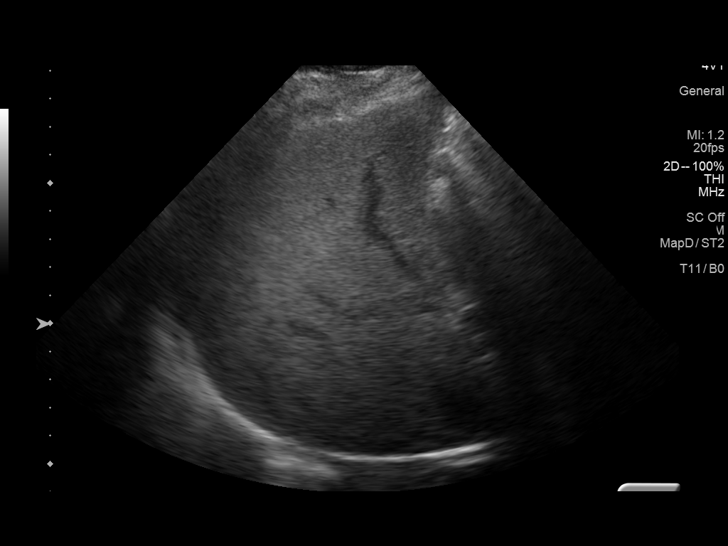
[im 20/60]
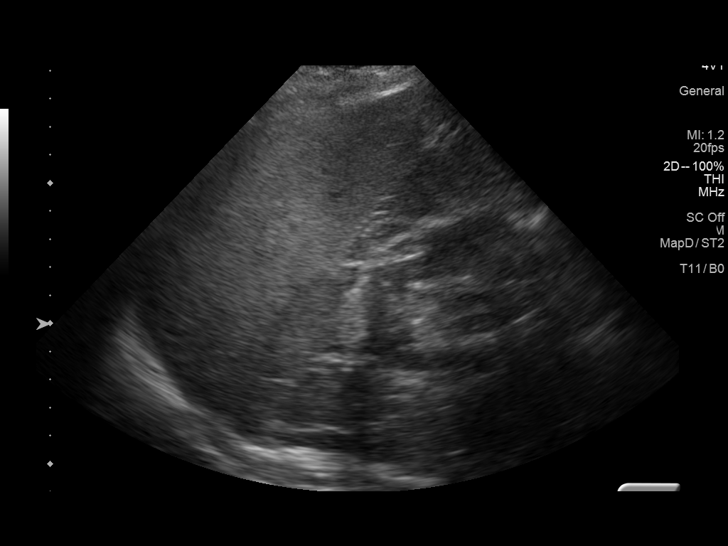
[im 23/60]
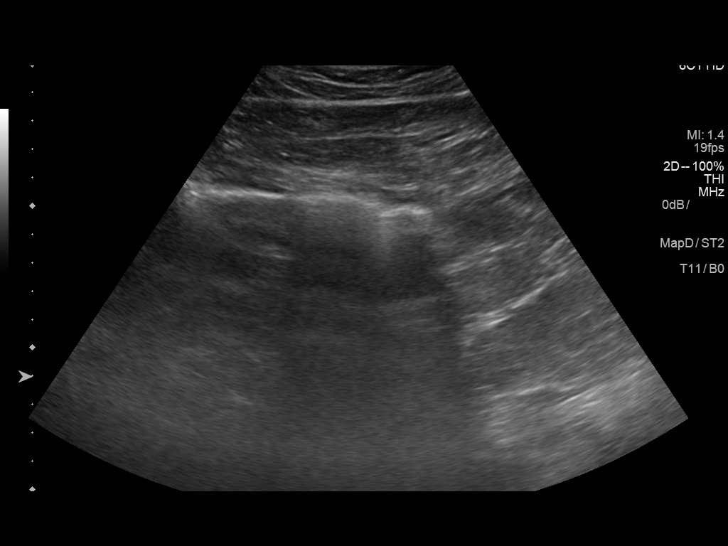
[im 28/60]
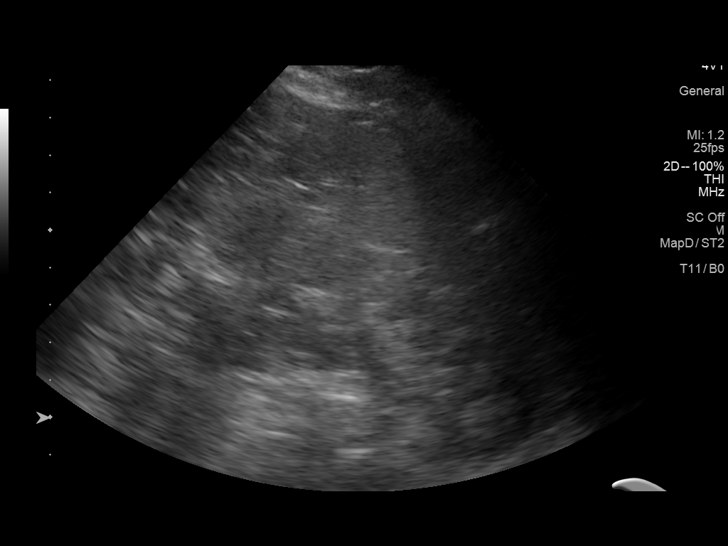
[im 32/60]
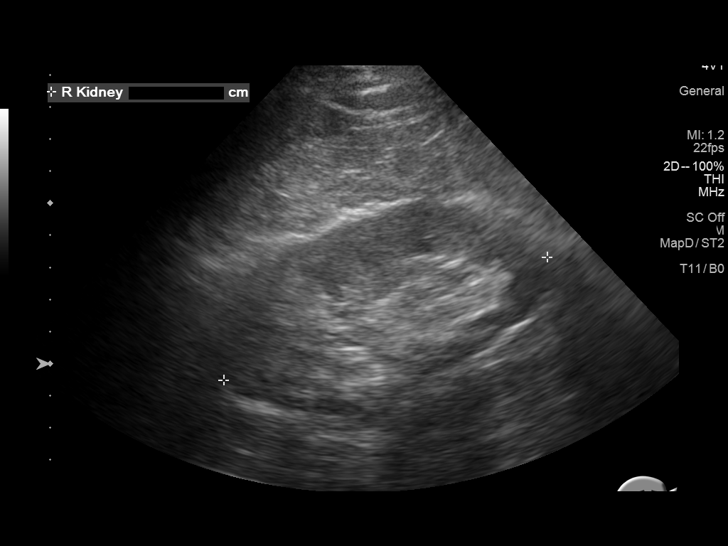
[im 37/60]
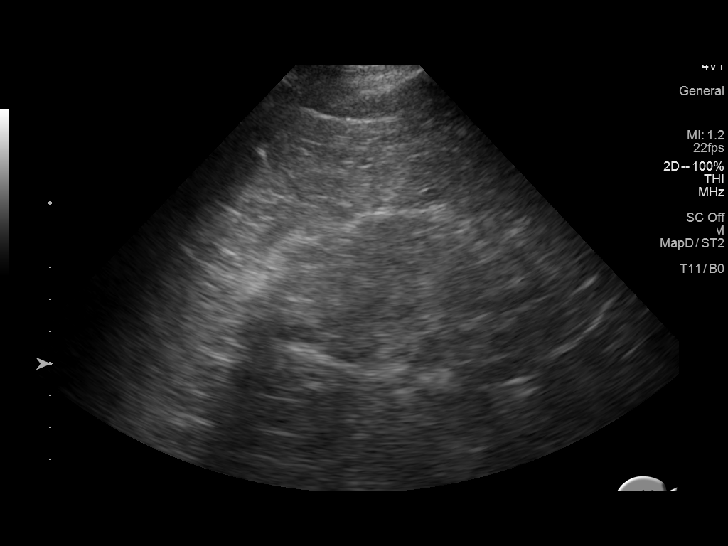
[im 40/60]
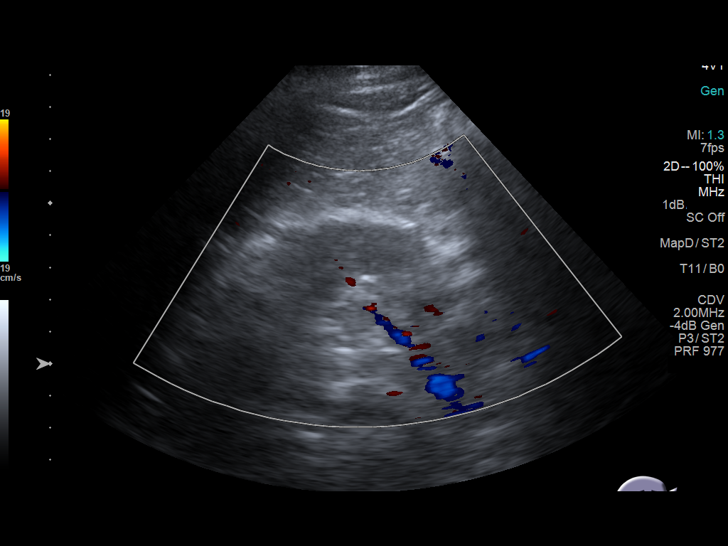
[im 45/60]
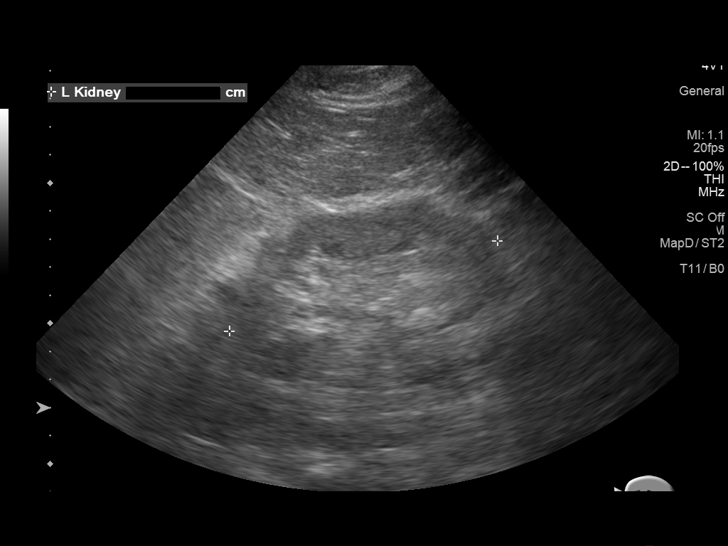
[im 50/60]
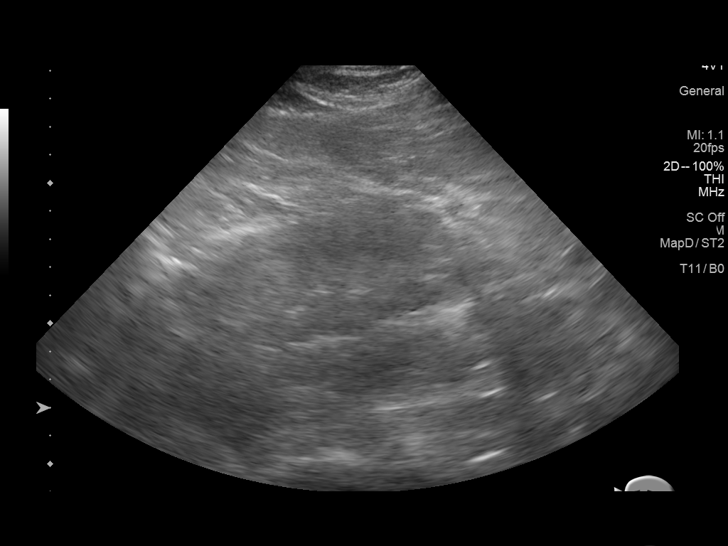
[im 55/60]
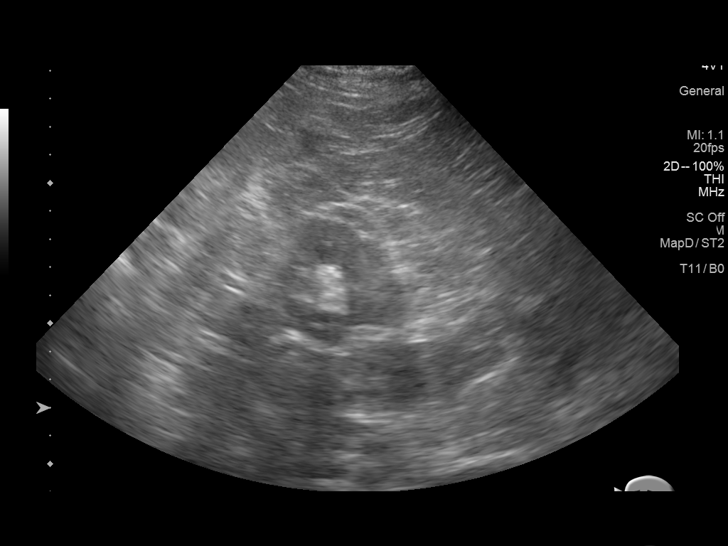
[im 60/60]
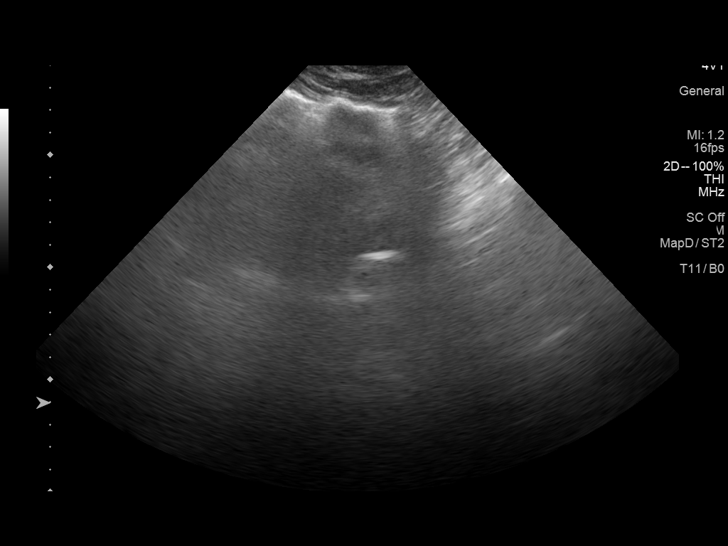

[14 of 25 positions shown; findings below may reference images not displayed]

FINDINGS: Gallbladder: Cholecystectomy.

Common bile duct: Diameter: 6.3 mm

Liver: No focal lesion identified. Liver is echogenic suggesting
fatty infiltration and/or hepatocellular disease . Liver is
difficult to evaluate due to elevated hemidiaphragm.

IVC: No abnormality visualized.

Pancreas: Visualized portion unremarkable.

Spleen: Size and appearance within normal limits.

Right Kidney: Length: 10.8 cm. Irregular contour suggesting
scarring. Echogenicity within normal limits. No mass or
hydronephrosis visualized.

Left Kidney: Length: 10.1 cm. Irregular contour suggesting scarring.
Echogenicity within normal limits. No mass or hydronephrosis
visualized.

Abdominal aorta: No aneurysm visualized.

Other findings: None.
IMPRESSION: 1. Cholecystectomy. No biliary distention. Liver is echogenic
suggesting fatty infiltration and/or hepatocellular disease. The
liver is difficult to evaluate due to elevated hemidiaphragm.

2.  Kidneys have irregular contour suggesting scarring.
